# Patient Record
Sex: Female | Born: 1935 | Race: White | Hispanic: No | Smoking: Never smoker
Health system: Southern US, Community
[De-identification: ages and names within clinical notes are randomized; demographics above are authoritative.]

## PROBLEM LIST (undated history)

## (undated) DIAGNOSIS — I10 Essential (primary) hypertension: Secondary | ICD-10-CM

## (undated) DIAGNOSIS — Z8701 Personal history of pneumonia (recurrent): Secondary | ICD-10-CM

## (undated) DIAGNOSIS — M5136 Other intervertebral disc degeneration, lumbar region: Secondary | ICD-10-CM

## (undated) DIAGNOSIS — E785 Hyperlipidemia, unspecified: Secondary | ICD-10-CM

## (undated) DIAGNOSIS — K219 Gastro-esophageal reflux disease without esophagitis: Secondary | ICD-10-CM

## (undated) DIAGNOSIS — A0472 Enterocolitis due to Clostridium difficile, not specified as recurrent: Secondary | ICD-10-CM

## (undated) DIAGNOSIS — IMO0001 Reserved for inherently not codable concepts without codable children: Secondary | ICD-10-CM

## (undated) DIAGNOSIS — K449 Diaphragmatic hernia without obstruction or gangrene: Secondary | ICD-10-CM

## (undated) DIAGNOSIS — M51369 Other intervertebral disc degeneration, lumbar region without mention of lumbar back pain or lower extremity pain: Secondary | ICD-10-CM

## (undated) HISTORY — DX: Diaphragmatic hernia without obstruction or gangrene: K44.9

## (undated) HISTORY — PX: CHOLECYSTECTOMY: SHX55

## (undated) HISTORY — PX: FRACTURE SURGERY: SHX138

## (undated) HISTORY — PX: ABDOMINAL SURGERY: SHX537

## (undated) HISTORY — PX: ABDOMINAL HYSTERECTOMY: SHX81

## (undated) HISTORY — PX: JOINT REPLACEMENT: SHX530

## (undated) HISTORY — DX: Reserved for inherently not codable concepts without codable children: IMO0001

## (undated) HISTORY — DX: Gastro-esophageal reflux disease without esophagitis: K21.9

## (undated) HISTORY — PX: OTHER SURGICAL HISTORY: SHX169

## (undated) HISTORY — PX: APPENDECTOMY: SHX54

---

## 2001-04-09 ENCOUNTER — Emergency Department (HOSPITAL_COMMUNITY): Admission: EM | Admit: 2001-04-09 | Discharge: 2001-04-09 | Payer: Self-pay | Admitting: *Deleted

## 2001-04-12 ENCOUNTER — Encounter: Payer: Self-pay | Admitting: Family Medicine

## 2001-04-12 ENCOUNTER — Inpatient Hospital Stay (HOSPITAL_COMMUNITY): Admission: AD | Admit: 2001-04-12 | Discharge: 2001-05-02 | Payer: Self-pay | Admitting: Family Medicine

## 2001-04-13 ENCOUNTER — Encounter: Payer: Self-pay | Admitting: General Surgery

## 2001-04-16 ENCOUNTER — Encounter: Payer: Self-pay | Admitting: General Surgery

## 2001-04-23 ENCOUNTER — Encounter: Payer: Self-pay | Admitting: Family Medicine

## 2001-04-25 ENCOUNTER — Encounter: Payer: Self-pay | Admitting: General Surgery

## 2001-04-26 ENCOUNTER — Encounter: Payer: Self-pay | Admitting: General Surgery

## 2001-07-10 ENCOUNTER — Inpatient Hospital Stay (HOSPITAL_COMMUNITY): Admission: AD | Admit: 2001-07-10 | Discharge: 2001-07-30 | Payer: Self-pay | Admitting: General Surgery

## 2001-07-11 ENCOUNTER — Encounter: Payer: Self-pay | Admitting: General Surgery

## 2001-07-21 ENCOUNTER — Encounter: Payer: Self-pay | Admitting: General Surgery

## 2001-07-27 ENCOUNTER — Encounter: Payer: Self-pay | Admitting: Family Medicine

## 2003-05-14 ENCOUNTER — Encounter: Payer: Self-pay | Admitting: Family Medicine

## 2003-05-14 ENCOUNTER — Encounter (HOSPITAL_COMMUNITY): Admission: RE | Admit: 2003-05-14 | Discharge: 2003-06-13 | Payer: Self-pay | Admitting: Family Medicine

## 2004-04-09 ENCOUNTER — Ambulatory Visit (HOSPITAL_COMMUNITY): Admission: RE | Admit: 2004-04-09 | Discharge: 2004-04-09 | Payer: Self-pay | Admitting: Family Medicine

## 2006-02-01 ENCOUNTER — Ambulatory Visit (HOSPITAL_COMMUNITY): Admission: RE | Admit: 2006-02-01 | Discharge: 2006-02-01 | Payer: Self-pay | Admitting: Family Medicine

## 2006-10-04 ENCOUNTER — Ambulatory Visit (HOSPITAL_COMMUNITY): Admission: RE | Admit: 2006-10-04 | Discharge: 2006-10-04 | Payer: Self-pay | Admitting: Family Medicine

## 2007-03-25 ENCOUNTER — Inpatient Hospital Stay (HOSPITAL_COMMUNITY): Admission: EM | Admit: 2007-03-25 | Discharge: 2007-03-29 | Payer: Self-pay | Admitting: Emergency Medicine

## 2007-04-24 ENCOUNTER — Inpatient Hospital Stay (HOSPITAL_COMMUNITY): Admission: AD | Admit: 2007-04-24 | Discharge: 2007-04-27 | Payer: Self-pay | Admitting: Family Medicine

## 2007-11-15 HISTORY — PX: COLONOSCOPY: SHX174

## 2007-11-15 HISTORY — PX: ESOPHAGOGASTRODUODENOSCOPY: SHX1529

## 2007-11-15 HISTORY — PX: COLON SURGERY: SHX602

## 2008-04-02 ENCOUNTER — Ambulatory Visit: Payer: Self-pay | Admitting: Internal Medicine

## 2008-04-11 ENCOUNTER — Ambulatory Visit (HOSPITAL_COMMUNITY): Admission: RE | Admit: 2008-04-11 | Discharge: 2008-04-11 | Payer: Self-pay | Admitting: Internal Medicine

## 2008-05-01 ENCOUNTER — Encounter: Payer: Self-pay | Admitting: Internal Medicine

## 2008-05-01 ENCOUNTER — Ambulatory Visit: Payer: Self-pay | Admitting: Internal Medicine

## 2008-05-01 ENCOUNTER — Ambulatory Visit (HOSPITAL_COMMUNITY): Admission: RE | Admit: 2008-05-01 | Discharge: 2008-05-01 | Payer: Self-pay | Admitting: Internal Medicine

## 2008-06-16 ENCOUNTER — Ambulatory Visit (HOSPITAL_COMMUNITY): Admission: RE | Admit: 2008-06-16 | Discharge: 2008-06-16 | Payer: Self-pay | Admitting: Family Medicine

## 2008-06-20 ENCOUNTER — Ambulatory Visit: Payer: Self-pay | Admitting: Internal Medicine

## 2008-08-12 ENCOUNTER — Ambulatory Visit: Payer: Self-pay | Admitting: Gastroenterology

## 2008-09-26 ENCOUNTER — Encounter: Payer: Self-pay | Admitting: Gastroenterology

## 2008-09-26 LAB — CONVERTED CEMR LAB
BUN: 32 mg/dL — ABNORMAL HIGH (ref 6–23)
Basophils Absolute: 0 10*3/uL (ref 0.0–0.1)
Basophils Relative: 0 % (ref 0–1)
CO2: 26 meq/L (ref 19–32)
Calcium: 9.6 mg/dL (ref 8.4–10.5)
Chloride: 102 meq/L (ref 96–112)
Creatinine, Ser: 1.55 mg/dL — ABNORMAL HIGH (ref 0.40–1.20)
HCT: 37.9 % (ref 36.0–46.0)
Hemoglobin: 12.3 g/dL (ref 12.0–15.0)
Lymphs Abs: 1.9 10*3/uL (ref 0.7–4.0)
MCHC: 32.5 g/dL (ref 30.0–36.0)
MCV: 90.9 fL (ref 78.0–100.0)
Monocytes Absolute: 0.5 10*3/uL (ref 0.1–1.0)
Monocytes Relative: 7 % (ref 3–12)
RBC: 4.17 M/uL (ref 3.87–5.11)
RDW: 13.1 % (ref 11.5–15.5)
Sodium: 141 meq/L (ref 135–145)
WBC: 7.5 10*3/uL (ref 4.0–10.5)

## 2008-09-30 ENCOUNTER — Ambulatory Visit: Payer: Self-pay | Admitting: Internal Medicine

## 2009-02-03 DIAGNOSIS — I10 Essential (primary) hypertension: Secondary | ICD-10-CM | POA: Insufficient documentation

## 2009-02-03 DIAGNOSIS — R1310 Dysphagia, unspecified: Secondary | ICD-10-CM | POA: Insufficient documentation

## 2009-02-03 DIAGNOSIS — A0472 Enterocolitis due to Clostridium difficile, not specified as recurrent: Secondary | ICD-10-CM | POA: Insufficient documentation

## 2009-02-03 DIAGNOSIS — Z8719 Personal history of other diseases of the digestive system: Secondary | ICD-10-CM

## 2009-02-03 DIAGNOSIS — K219 Gastro-esophageal reflux disease without esophagitis: Secondary | ICD-10-CM

## 2009-02-03 DIAGNOSIS — K222 Esophageal obstruction: Secondary | ICD-10-CM

## 2009-02-03 DIAGNOSIS — E785 Hyperlipidemia, unspecified: Secondary | ICD-10-CM

## 2009-02-03 DIAGNOSIS — K5289 Other specified noninfective gastroenteritis and colitis: Secondary | ICD-10-CM | POA: Insufficient documentation

## 2009-02-03 DIAGNOSIS — R197 Diarrhea, unspecified: Secondary | ICD-10-CM

## 2009-10-05 ENCOUNTER — Encounter: Payer: Self-pay | Admitting: Gastroenterology

## 2009-11-10 ENCOUNTER — Encounter: Payer: Self-pay | Admitting: Gastroenterology

## 2010-02-11 ENCOUNTER — Ambulatory Visit: Payer: Self-pay | Admitting: Cardiology

## 2010-02-11 ENCOUNTER — Inpatient Hospital Stay (HOSPITAL_COMMUNITY): Admission: EM | Admit: 2010-02-11 | Discharge: 2010-02-15 | Payer: Self-pay | Admitting: Emergency Medicine

## 2010-02-12 ENCOUNTER — Encounter (INDEPENDENT_AMBULATORY_CARE_PROVIDER_SITE_OTHER): Payer: Self-pay | Admitting: Internal Medicine

## 2010-02-15 ENCOUNTER — Inpatient Hospital Stay: Admission: AD | Admit: 2010-02-15 | Discharge: 2010-05-14 | Payer: Self-pay | Admitting: Internal Medicine

## 2010-02-25 ENCOUNTER — Ambulatory Visit (HOSPITAL_COMMUNITY): Admission: RE | Admit: 2010-02-25 | Discharge: 2010-02-25 | Payer: Self-pay | Admitting: Orthopaedic Surgery

## 2010-03-25 ENCOUNTER — Ambulatory Visit (HOSPITAL_COMMUNITY): Admission: RE | Admit: 2010-03-25 | Discharge: 2010-03-25 | Payer: Self-pay | Admitting: Internal Medicine

## 2010-03-30 ENCOUNTER — Ambulatory Visit (HOSPITAL_COMMUNITY): Admission: RE | Admit: 2010-03-30 | Discharge: 2010-03-30 | Payer: Self-pay | Admitting: Orthopaedic Surgery

## 2010-04-05 ENCOUNTER — Ambulatory Visit (HOSPITAL_COMMUNITY): Admission: RE | Admit: 2010-04-05 | Discharge: 2010-04-05 | Payer: Self-pay | Admitting: Internal Medicine

## 2010-04-13 ENCOUNTER — Ambulatory Visit (HOSPITAL_COMMUNITY): Admission: RE | Admit: 2010-04-13 | Discharge: 2010-04-13 | Payer: Self-pay | Admitting: Orthopaedic Surgery

## 2010-10-18 ENCOUNTER — Telehealth (INDEPENDENT_AMBULATORY_CARE_PROVIDER_SITE_OTHER): Payer: Self-pay

## 2010-12-16 NOTE — Progress Notes (Signed)
Summary: ? about entocort  Phone Note Call from Patient Call back at [phone]   Caller: Son-Terry Summary of Call: pts son called. Pts pharmacy switched her to a generic entocort bid . son stated that she has had diarrhea ever since. She is not taking any laxatives right now either. pt wants to know if its ok to try giving them to her three times a day like she was on before. please advise Initial call taken by: Hendricks Limes LPN,  October 18, 2010 12:23 PM     Appended Document: ? about entocort we haven't seen in over 2 years; we need more info regarding diarrhea, etc. Is she in the NH?  Appended Document: ? about entocort pt is at the son's home now. He stated her diarrhea is not bad yet, normally occurs first thing in the morning. No fever.   Appended Document: ? about entocort take one Imodium each morning first thing and see how she does w this regimen alone for a couple of weeks  Appended Document: ? about entocort tried to call son- LMOM  Appended Document: ? about entocort pts son aware

## 2011-02-02 LAB — BASIC METABOLIC PANEL
BUN: 15 mg/dL (ref 6–23)
CO2: 28 mEq/L (ref 19–32)
CO2: 29 mEq/L (ref 19–32)
Calcium: 8.5 mg/dL (ref 8.4–10.5)
Calcium: 8.7 mg/dL (ref 8.4–10.5)
Chloride: 102 mEq/L (ref 96–112)
Creatinine, Ser: 0.97 mg/dL (ref 0.4–1.2)
Creatinine, Ser: 1.33 mg/dL — ABNORMAL HIGH (ref 0.4–1.2)
GFR calc Af Amer: 29 mL/min — ABNORMAL LOW (ref >60)
GFR calc Af Amer: 45 mL/min — ABNORMAL LOW (ref >60)
GFR calc Af Amer: >60 mL/min (ref >60)
GFR calc non Af Amer: 24 mL/min — ABNORMAL LOW (ref >60)
GFR calc non Af Amer: 37 mL/min — ABNORMAL LOW (ref >60)
GFR calc non Af Amer: 45 mL/min — ABNORMAL LOW (ref >60)
Glucose, Bld: 139 mg/dL — ABNORMAL HIGH (ref 70–99)
Glucose, Bld: 144 mg/dL — ABNORMAL HIGH (ref 70–99)
Potassium: 4.5 mEq/L (ref 3.5–5.1)
Sodium: 134 mEq/L — ABNORMAL LOW (ref 135–145)
Sodium: 140 mEq/L (ref 135–145)

## 2011-02-02 LAB — CBC
HCT: 28.2 % — ABNORMAL LOW (ref 36.0–46.0)
Hemoglobin: 10.5 g/dL — ABNORMAL LOW (ref 12.0–15.0)
Hemoglobin: 9.7 g/dL — ABNORMAL LOW (ref 12.0–15.0)
MCHC: 34.3 g/dL (ref 30.0–36.0)
MCHC: 34.4 g/dL (ref 30.0–36.0)
MCHC: 34.9 g/dL (ref 30.0–36.0)
MCV: 89.2 fL (ref 78.0–100.0)
Platelets: 165 10*3/uL (ref 150–400)
RBC: 3.04 MIL/uL — ABNORMAL LOW (ref 3.87–5.11)
RBC: 3.11 MIL/uL — ABNORMAL LOW (ref 3.87–5.11)
RBC: 3.14 MIL/uL — ABNORMAL LOW (ref 3.87–5.11)
RBC: 3.4 MIL/uL — ABNORMAL LOW (ref 3.87–5.11)
RDW: 13.2 % (ref 11.5–15.5)
WBC: 9 10*3/uL (ref 4.0–10.5)

## 2011-02-02 LAB — DIFFERENTIAL
Basophils Absolute: 0 10*3/uL (ref 0.0–0.1)
Basophils Absolute: 0 10*3/uL (ref 0.0–0.1)
Basophils Relative: 0 % (ref 0–1)
Basophils Relative: 0 % (ref 0–1)
Basophils Relative: 0 % (ref 0–1)
Eosinophils Absolute: 0 10*3/uL (ref 0.0–0.7)
Eosinophils Absolute: 0.1 10*3/uL (ref 0.0–0.7)
Eosinophils Relative: 1 % (ref 0–5)
Lymphocytes Relative: 11 % — ABNORMAL LOW (ref 12–46)
Lymphocytes Relative: 11 % — ABNORMAL LOW (ref 12–46)
Lymphs Abs: 1 10*3/uL (ref 0.7–4.0)
Monocytes Absolute: 0.5 10*3/uL (ref 0.1–1.0)
Monocytes Absolute: 0.8 10*3/uL (ref 0.1–1.0)
Monocytes Relative: 10 % (ref 3–12)
Monocytes Relative: 7 % (ref 3–12)
Monocytes Relative: 9 % (ref 3–12)
Neutro Abs: 7 10*3/uL (ref 1.7–7.7)
Neutro Abs: 7.6 10*3/uL (ref 1.7–7.7)
Neutrophils Relative %: 78 % — ABNORMAL HIGH (ref 43–77)
Neutrophils Relative %: 79 % — ABNORMAL HIGH (ref 43–77)
Neutrophils Relative %: 81 % — ABNORMAL HIGH (ref 43–77)

## 2011-02-02 LAB — CULTURE, BLOOD (ROUTINE X 2): Culture: NO GROWTH

## 2011-02-02 LAB — CARDIAC PANEL(CRET KIN+CKTOT+MB+TROPI)
Relative Index: INVALID (ref 0.0–2.5)
Total CK: 43 U/L (ref 7–177)
Total CK: 88 U/L (ref 7–177)
Troponin I: 0.02 ng/mL (ref 0.00–0.06)
Troponin I: 0.02 ng/mL (ref 0.00–0.06)

## 2011-02-04 LAB — URINALYSIS, ROUTINE W REFLEX MICROSCOPIC
Glucose, UA: NEGATIVE mg/dL
Ketones, ur: NEGATIVE mg/dL
Nitrite: POSITIVE — AB
Protein, ur: NEGATIVE mg/dL

## 2011-02-04 LAB — COMPREHENSIVE METABOLIC PANEL
ALT: 12 U/L (ref 0–35)
AST: 19 U/L (ref 0–37)
Albumin: 3.2 g/dL — ABNORMAL LOW (ref 3.5–5.2)
Calcium: 8.7 mg/dL (ref 8.4–10.5)
GFR calc Af Amer: 59 mL/min — ABNORMAL LOW (ref >60)
Sodium: 138 mEq/L (ref 135–145)
Total Protein: 6 g/dL (ref 6.0–8.3)

## 2011-02-04 LAB — URINE CULTURE: Colony Count: >=100000

## 2011-02-04 LAB — DIFFERENTIAL
Eosinophils Absolute: 0 10*3/uL (ref 0.0–0.7)
Eosinophils Relative: 1 % (ref 0–5)
Lymphs Abs: 1.2 10*3/uL (ref 0.7–4.0)
Monocytes Relative: 6 % (ref 3–12)

## 2011-02-04 LAB — CK TOTAL AND CKMB (NOT AT ARMC)
Relative Index: INVALID (ref 0.0–2.5)
Total CK: 34 U/L (ref 7–177)

## 2011-02-04 LAB — TROPONIN I: Troponin I: 0.02 ng/mL (ref 0.00–0.06)

## 2011-02-04 LAB — CBC
MCHC: 34.4 g/dL (ref 30.0–36.0)
Platelets: 145 10*3/uL — ABNORMAL LOW (ref 150–400)
RBC: 3.62 MIL/uL — ABNORMAL LOW (ref 3.87–5.11)
RDW: 13.5 % (ref 11.5–15.5)

## 2011-02-04 LAB — PROTIME-INR
INR: 1.2 (ref 0.00–1.49)
Prothrombin Time: 15.1 seconds (ref 11.6–15.2)

## 2011-02-04 LAB — URINE MICROSCOPIC-ADD ON

## 2011-02-04 LAB — FERRITIN: Ferritin: 74 ng/mL (ref 10–291)

## 2011-02-04 LAB — RETICULOCYTES
Retic Count, Absolute: 50.4 10*3/uL (ref 19.0–186.0)
Retic Ct Pct: 1.4 % (ref 0.4–3.1)

## 2011-02-04 LAB — IRON AND TIBC: Saturation Ratios: 8 % — ABNORMAL LOW (ref 20–55)

## 2011-02-04 LAB — APTT: aPTT: 42 seconds — ABNORMAL HIGH (ref 24–37)

## 2011-02-04 LAB — FOLATE: Folate: >20 ng/mL

## 2011-03-29 NOTE — Assessment & Plan Note (Signed)
NAME:  Elizabeth Oneal, Elizabeth Oneal                   CHART#:  [id number]   DATE:  06/20/2008                       DOB:  [birth year]   Last seen 05/01/2008, at which time, she underwent both EGD with [facility]  dilation and ileocolonoscopy with  stool sampling and biopsies.  Stool  studies were negative for infection.  However, biopsies were consistent  with microscopic colitis.  She was started on Entocort 6 mg orally  daily, and this was associated with resolution of her diarrhea symptoms.  However, she could not afford the Entocort, and we provided her samples.  She ran out, she had a rapid recurrence of diarrhea and apparently had  some mental status changes last week for which she saw Dr. Gerda Diss and  was in the process, been worked up.  We got her back on some more  samples of Entocort, diarrhea has now resolved.  Once again, she was  found to have a Schatzki's ring, and this was dilated.  She is doing  well from a standpoint of dysphagia.  At this point in time, her oral  intake is reported to be good at this point.   CURRENT MEDICATIONS:  See updated list.   ALLERGIES:  Codeine.   PHYSICAL EXAMINATION:  Today, she appears comfortable, in no acute  distress.  Weight 149, height 5 feet 10 inches, temperature 99.2, BP  120/60, pulse 80.  Detailed exam is deferred.   ASSESSMENT:  1. Dysphagia resolved, status post Maloney dilation of Schatzki's      ring.  2. Microscopic diarrhea secondary microscopic colitis.  Nice response      to low-dose Entocort (budesonide).   RECOMMENDATIONS:  We will continue her on 6 mg daily dose for the next  month, then we will drop back to 1-3 mg once daily for the subsequent  month.  We will see her back in 2 months to see how she is doing and  decide about further low-dose therapy versus switching her to a  mesalamine versus tapering Entocort off completely and then seeing how  she does.       Jonathon Bellows, M.D.  Electronically Signed    RMR/MEDQ  D:   06/20/2008  T:  06/21/2008  Job:  [id number]   cc:   Dr. Gerda Diss

## 2011-03-29 NOTE — H&P (Signed)
NAME:  Elizabeth Oneal, Elizabeth Oneal                  ACCOUNT NO.:  [id number]   MEDICAL RECORD NO.:  [id number]          PATIENT TYPE:  AMB   LOCATION:  DAY                           FACILITY:  [facility]   PHYSICIAN:  R. Roetta Sessions, M.D. DATE OF BIRTH:  [birth year]   DATE OF ADMISSION:  DATE OF DISCHARGE:  LH                              HISTORY & PHYSICAL   REFERRING PHYSICIAN:  Donna Bernard, MD.   REASON FOR CONSULTATION:  Diarrhea.   HISTORY OF PRESENT ILLNESS:  The patient is a very pleasant 77 year old  Caucasian female, sent over courtesy by Dr. Simone Curia, to further  evaluate a good 7 to 68-month history of nonbloody watery diarrhea.  The  patient states she was in her usual state of GI health until October and  November, when she unfortunately suffered a cat bite, which was  complicated by cellulitis, which lead to antibiotics therapy.  Just  during and after antibiotics therapy, she developed nonbloody watery  diarrhea.  Reportedly, she had C. difficile toxin assays on her stool,  which came back negative.  She has had multiple rounds of metronidazole  and by report, she seemingly did improve somewhat each time she had a  course of Flagyl, but diarrhea never really went away.  She has been on  cholestyramine 4 g daily which slows the diarrhea, but if she gets off  cholestyramine, she has recurrent diarrhea upwards of 6-7 watery bowel  movements daily in contrast to her premorbid bowel frequency of 1-2  formed bowel movements daily.  She is never passing blood per rectum,  has not had any melena.  She had a colonoscopy by Dr. Myra Gianotti some 8  years ago.  Reportedly has diverticulosis and in fact had a history of  complicated diverticulitis, by report.  Had emergency laparotomy with  diversion, colostomy, and subsequent takedown years ago by Dr. Katrinka Blazing.   No one else around her has any problems with diarrhea.  There is no  family history of inflammatory bowel disease.   MEDICATIONS:   Have been reviewed (see below).   She does not have nocturnal diarrhea.  Her grandson, Ailsa Mireles, who  accompanies her today states she has lost some weight recently, but is  unable to quantify the amount.  In addition to diarrhea, the patient  tells me she has had a recent symptoms of esophageal dysphagia to solids  and feels that meat and bread get hung behind her breastbone.  She  really does not have much in the way reflux symptoms, perhaps once  monthly for which she takes Pepcid [facility] p.r.n.   She tells me that on some imaging study in the past, she was told she  had gallstones, but they are apparently not causing a problem at this  time.  She really has not had any significant abdominal pain.   PAST MEDICAL HISTORY:  Significant for hypertension, complicated  diverticulitis, gastroesophageal reflux disease, hyperlipidemia, and  diarrhea as outlined above.   PAST SURGERIES:  Include; colonoscopy, laparotomy for diverticular  abscess with colostomy and subsequent takedown, and colonoscopy  as  outlined above.   CURRENT MEDICATIONS:  1. Triamterene and hydrochlorothiazide 37.5 mg daily.  2. Lovastatin 20 mg daily.  3. Citalopram 30 mg daily.  4. Primidone 50 mg daily.  5. Multivitamin daily.  6. Cholestyramine 4 g daily.  7. Alprazolam 0.25 mg daily.  8. Ibuprofen 200 mg p.r.n.  9. Pepcid AC p.r.n.   ALLERGIES:  CODEINE.   FAMILY HISTORY:  Mother had nerve problems.  Father died at age 73,  old age.  No history chronic GI or liver illness.   SOCIAL HISTORY:  The patient is widowed, has two children.  She is  retired and lives in [place].  No tobacco or no alcohol.  No  illicit drugs.   REVIEW OF SYSTEMS:  Has not had any chest pain or dyspnea on exertion.  No fevers, chills, night sweats, some ill-defined weight loss as  outlined above.  Otherwise, as in the history of present illness.   PHYSICAL EXAMINATION:  GENERAL:  Reveals a remarkably alert, well   oriented, and conversant 77 year old lady, accompanied by her grandson,  Terrianne Cavness, who appears in no acute distress.  VITAL SIGNS:  Weight 157, height 5 feet 1 inch, temperature 98.4, BP  120/70, and pulse 88.  SKIN:  Warm and dry.  There is no jaundice.  HEENT:  No scleral icterus.  Conjunctivae pink.  Oral cavity, no  lesions.  CHEST:  Lungs are clear to auscultation.  CARDIOVASCULAR:  Regular rate and rhythm without murmur, gallop, or rub.  BREAST:  Deferred.  ABDOMEN:  Well healed laparotomy scars.  Positive bowel sounds and no  bruits.  Soft and nontender without appreciable mass or  hepatosplenomegaly.  EXTREMITIES:  No edema.  RECTAL:  She has some degree of anal stenosis.  No mass.  Rectal vault:  Scant brown stool, is Hemoccult negative.   IMPRESSION:  The patient is a 77 year old lady with diarrhea dating back  to October 2008.  There is a strong __________ relationship with a cat  bite resulting in secondary cellulitis requiring antibiotics.  She has  had an inadequate response with empiric treatment with metronidazole on  at least two occasions.  It is striking that she continues to have a  problem with watery nonbloody diarrhea since this time in contrast to  her premorbid gastrointestinal status of 1-2 formed bowel movements  daily.  Even though her Clostridium difficile is negative, she certainly  could have a persisting antibiotic-associated diarrhea or simply have  had false negative toxin assays.  She has been taking Questran,  splinting her dose b.i.d. and has been taking it with other medicines  concomitantly and possibly even metronidazole previously.  It is also  possible that the cellulitis/antibiotic episode could be entirely  coincidental to the onset of her diarrhea.  We would need to also be  concerned about other diagnostic possibilities such as collagenous  lymphocytic colitis.  As a separate issue, she reports to me esophageal  dysphagia to solids.    RECOMMENDATIONS:  I feel the best course of action is to go ahead and  set her up for a diagnostic colonoscopy in the near future at [facility] with plans to obtain fresh stool specimens for the microbiology  lab and go ahead and biopsy  to rule out microscopic colitis as a  potential cause for her diarrhea.  Prior to scheduling colonoscopy, we  will go ahead and have her undergo a barium plus esophagram to see if  she has a significant lesion in her upper GI tract contributing to  dysphagia.  If she has evidence of a ring and/or stricture, a minimal to  esophageal dilation also I told Mr. Wakeland and his grandmother that we  would plan to do an EGD and dilate her esophagus as appropriate at the  same time as the colonoscopy.  The risks, benefits, limitations,  alternatives have been reviewed.  All questions have been answered.  All  parties agreeable.  We will make further recommendations in the very  near future.   I would thank Dr. Tommie Ard for allowing me to see this his very nice  lady today      R. Roetta Sessions, M.D.  Electronically Signed     RMR/MEDQ  D:  04/02/2008  T:  04/03/2008  Job:  [id number]

## 2011-03-29 NOTE — Assessment & Plan Note (Signed)
NAMEMarland Kitchen  SOMALIA, SEGLER                   CHART#:  [id number]   DATE:  08/12/2008                       DOB:  05-21-1916   CHIEF COMPLAINT:  Follow up of diarrhea.   SUBJECTIVE:  The patient is a 77 year old lady who presents today in  followup.  She was last seen on 06/20/2008.  She has a history of  microscopic colitis.  She has been on low-dose Entocort 6 mg daily,  doing very well.  When we tried to taper her down to 3 mg daily, she had  recurrence of her diarrhea, however.  We increased Entocort back to 2  daily (6 mg).  Her diarrhea has now resolved again.  Overall, she is  doing well.  She is having about 1 to 2 soft stools daily.  She denies  any abdominal pain.  No nausea or vomiting.  Appetite is good.  She  denies any weight loss.  No blood in stool or melena.   CURRENT MEDICATIONS:  See updated list.   ALLERGIES:  Codeine.   PHYSICAL EXAMINATION:  VITAL SIGNS:  Weight 154, temperature 98.3, blood  pressure 130/66, and pulse 80.GENERAL:  Pleasant elderly Caucasian  female, in no acute distress.SKIN:  Warm and dry.  No jaundice.HEENT:  Sclerae nonicteric.  Oropharyngeal mucosa moist and pink. ABDOMEN:  Positive bowel sounds.  Abdomen is soft, nontender, and nondistended.   IMPRESSION:  Microscopic colitis, difficult to taper off of Entocort.  She has always done very well at 6 mg daily, but did not tolerate 3 mg  daily due to recurrence of diarrhea.  She is also having difficulty  affording the medication and we have been providing her samples.  It  appears we may need to consider chronic mesalamine therapy.  We will try  to determine if any of the  mesalamine preps are covered by her drug plan.  For now, we will keep  her on Entocort 6 mg daily, #42 samples provided.  Further  recommendations to follow.       Tana Coast, P.A.  Electronically Signed     Kassie Mends, M.D.  Electronically Signed    LL/MEDQ  D:  08/12/2008  T:  08/12/2008  Job:  [id number]   cc:    Dr. Gerda Diss

## 2011-03-29 NOTE — H&P (Signed)
NAMEJOCILYN, Elizabeth Oneal                  ACCOUNT NO.:  [id number]   MEDICAL RECORD NO.:  [id number]          PATIENT TYPE:  INP   LOCATION:  A314                          FACILITY:  [facility]   PHYSICIAN:  Donna Bernard, M.D.DATE OF BIRTH:  [birth year]   DATE OF ADMISSION:  04/24/2007  DATE OF DISCHARGE:  LH                              HISTORY & PHYSICAL   CHIEF COMPLAINT:  Vomiting all night, severe diarrhea and abdominal  pain, dizzy when standing.   SUBJECTIVE:  This patient is an 77 year old female recently in the  hospital for cellulitis secondary to cat bite.  The patient was given an  appropriate course of Augmentin. She finished this and within a few days  started developing diarrhea.  This was about a week ago. Since then she  has had diarrhea off and on the last couple days, has had severe  diarrhea with 10-12 trips per day.  She is feeling dizzy and  lightheaded, and that when she stands up she feels extremely weak.  It  should be noted the patient has a baseline fatigue in recent years, but  it is much worse.  The patient also reports she has had intermittent  vomiting.  However, in the last 24 hours she has been unable to keep  anything down and has vomited at least 6 times. She also notes abdominal  pain, crampy in nature, coming and going, pan abdominal. No urinary  symptoms.  The patient feels like she may have had some fever at times,  but she is not sure; she did not check it.  She claims compliance with  her current medicines which include:  1. Dyazide 37.5/25 one q.a.m.  2. Xanax 0.25 b.i.d.  3. Nexium 40 mg daily.  4. Ultracet one b.i.d. p.r.n. for chronic pain.  5. Celexa 20 mg daily.  6. Lovastatin 20 mg nightly.   PAST SURGERIES:  1. Remote hysterectomy.  2. Vein stripping.  3. Cataract operation both eyes.  4. Knee replacement.   PRIOR MEDICAL HISTORY:  Significant for:  1. Hyperlipidemia.  2. Hypertension.  3. Chronic venous stasis.  4. Chronic  depression and anxiety accompanied by fatigue.  5. Reflux.   FAMILY HISTORY:  Positive for coronary artery disease.   ALLERGIES:  Sensitivities to multiple medications and true allergy to  CODEINE and PENICILLIN.   REVIEW OF SYSTEMS:  Otherwise negative.   SOCIAL HISTORY:  The  patient is widowed, has several children.  No  tobacco or alcohol use.   PHYSICAL EXAMINATION:  VITAL SIGNS:  Temperature 99, blood pressure  135/80 when lying down, 105/60 when sitting up, pulse rate 95 lying  down, 120 sitting up.  HEENT: Mucous membranes dry.  GENERAL:  Fretful, in some distress, holding her abdomen.  NECK:  Supple.  LUNGS:  Clear.  HEART:  Regular rate and rhythm.  ABDOMEN:  Bowel sounds are present.  Diffuse tenderness.  RECTAL: Exam deferred.  EXTREMITIES: No significant edema.  SKIN:  Normal.  NEUROLOGIC:  Exam intact.   SIGNIFICANT LABORATORY DATA:  White blood count normal.  MET-7:  Creatinine 1.2, BUN in the 20s. Stool studies pending.   IMPRESSION:  1. Probable pseudomembranous colitis.  2. Orthostatic hypotension with dehydration.   PLAN:  1. IV rehydration.  2. Oral metronidazole.  3. Oral Questran.  4. Will see how her abdominal exams is looking tomorrow in terms of      deciding whether to do the CT scanner not.  5. Further orders as noted on the chart.  6. Zofran p.r.n. for nausea.      Donna Bernard, M.D.  Electronically Signed     WSL/MEDQ  D:  04/25/2007  T:  04/25/2007  Job:  [id number]

## 2011-03-29 NOTE — Op Note (Signed)
NAME:  Elizabeth Oneal, Elizabeth Oneal                  ACCOUNT NO.:  [id number]   MEDICAL RECORD NO.:  [id number]          PATIENT TYPE:  AMB   LOCATION:  DAY                           FACILITY:  [facility]   PHYSICIAN:  R. Roetta Sessions, M.D. DATE OF BIRTH:  06/14/1916   DATE OF PROCEDURE:  05/01/2008  DATE OF DISCHARGE:                               OPERATIVE REPORT   EGD with Elease Hashimoto dilation followed by ileocolonoscopy with biopsy stool  sampling.   INDICATIONS FOR PROCEDURE:  This is a 77 year old lady with refractory  nonbloody diarrhea.  She also has esophageal dysphagia to solids.  She  underwent a barium pill esophagram which demonstrated obstruction to  passage and filled the EG junction.  The pill ultimately dissolved in  the distal esophagus without passing.  EGD with esophageal dilation as  appropriate followed by colonoscopy now being done to further evaluate  her symptoms.  Risks, benefits, alternatives, and limitations have been  reviewed, questions answered, and all parties agreeable.  Please see  documentation and medical record.   PROCEDURE NOTE:  O2 saturation, blood pressure, pulse, and respirations  were monitored through the entire procedure.   CONSCIOUS SEDATION:  Versed 5 mg IV, Demerol 75 mg IV in divided doses.   INSTRUMENT:  Pentax video chip system.  Cetacaine spray for topical  pharyngeal anesthesia.   FINDINGS:  EGD:  Examination of the tubular esophagus revealed a  Schatzki ring.  Esophageal mucosa otherwise appeared normal.  EG  junction easily traversed.   Stomach:  Gastric cavity was emptied and insufflated well with air.  Thorough examination of the gastric mucosa including retroflex view of  the proximal stomach and esophagogastric junction demonstrated only a  small hiatal hernia.  Pylorus was patent and easily traversed.  Examination of bulb and second portion revealed no abnormalities.  Therapeutic/diagnostic maneuvers were performed.  Scope was withdrawn.  A  54-French Maloney dilator was passed to full insertion and look back  revealed no apparent complication with the patient dilated.  The patient  tolerated the procedure well and was prepped for colonoscopy.  Digital  rectal exam revealed no abnormalities.   Endoscopic Findings:  The prep was good.  Examination of the colon was  undertaken.  The surgical anastomosis was identified at 20 cm and  appeared normal.  The residual colon was examined with the scope easily  passing from the anastomosis to the cecum.  Cecum, ileocecal valve, and  appendiceal orifice were well seen and photographed for the record.  Terminal ileum was intubated to 5 cm.  From this level, the scope was  slowly and cautiously withdrawn.  All previously mentioned mucosal  surfaces were again seen.  The residual colonic mucosa appeared normal.  All loose somewhat diffusely pale in appearance stool sample was  suctioned out for the microbiology lab.  Segmental biopsies of the  transverse and distal colon were taken for histologic study.  We went on  and biopsied one of the areas in transverse colon.  It bled more than  expected.  There was steady red color blood noted.  After  the biopsy was  taken, I elected to place a clip.  This was done without difficulty and  reduced immediate hemostasis.  Please see photos.  The remainder of the  residual colonic mucosa appeared normal.  Biopsies as stated of the  transverse and distal colon were taken to rule out microscopic colitis.  Scope was pulled down the rectum where a thorough examination of the  rectal mucosa including retroflex view of the anal verge demonstrated no  abnormalities.  The patient tolerated both procedures well and was  reactive to endoscopy.   IMPRESSION:  1. Esophagogastroduodenoscopy:  Schatzki ring, otherwise normal      esophagus.  Status post passage with [facility] dilator.  Small hiatal      hernia, otherwise normal stomach, D1 and D2.  2. Colonoscopy  findings:  Normal rectum.  Surgical anastomosis 20 cm      slightly pale, but otherwise normal-appearing residual colon and      terminal ileum status post segmental biopsies and stool studies      status post clipping with the biopsies.  Sites for the reasons      outlined above.   RECOMMENDATIONS:  1. Continue Pepcid before meals as needed for occasional reflux.  2. Advance diet as tolerated.  3. Followup on stool studies and biopsies.  4. No MRI until hemostasis clip document to have passed.  Further      recommendations to follow.      Jonathon Bellows, M.D.  Electronically Signed     RMR/MEDQ  D:  05/01/2008  T:  05/01/2008  Job:  [id number]   cc:   Donna Bernard, M.D.  Fax: [phone]

## 2011-03-29 NOTE — H&P (Signed)
Elizabeth Oneal, Elizabeth Oneal                  ACCOUNT NO.:  [id number]   MEDICAL RECORD NO.:  [id number]          PATIENT TYPE:  INP   LOCATION:  A315                          FACILITY:  [facility]   PHYSICIAN:  Scott A. Gerda Diss, MD    DATE OF BIRTH:  1916/04/13   DATE OF ADMISSION:  03/25/2007  DATE OF DISCHARGE:  LH                              HISTORY & PHYSICAL   CHIEF COMPLAINT:  Fever, left leg swelling, redness, tenderness.   HISTORY OF PRESENT ILLNESS:  This 77 year old, white female who states  she has an indoor cat, has always stayed indoors, does not go outdoors.  She states it is up-to-date on rabies shots, no way to confirm this.  Anyway the cat got really mad at another count that was outside the door  of the house and the cat just went ballistic and attacked Elizabeth Oneal who  happened to be standing right at the door trying to shoo away the other  cat. She states otherwise the cat acts normal, is very loving, kind and  does not do these things, only when another cat comes close does it get  agitated. The patient states that this bite happened about a week ago  and has been progressively getting worse since then. More redness,  tenderness and some fever today and not feeling good.   PAST MEDICAL HISTORY:  The patient has a history of hyperlipidemia,  hiatal hernia, hypertension, peripheral venous stasis, reflux and knee  arthritis. She  has had a knee replacement, hysterectomy and vein  stripping.   FAMILY HISTORY:  Heart disease.   ALLERGIES:  CODEINE and PENICILLIN.   Does not smoke or drink.   1. Dyazide 37.5/25 daily.  2. Xanax 0.25 mg p.r.n.  3. Ultracet b.i.d. p.r.n.  4. Celexa 20 mg daily.  5. Mevacor 20 mg daily.   REVIEW OF SYSTEMS:  Negative for headaches, sore throat, cough,  shortness of breath, vomiting, diarrhea.   PHYSICAL EXAMINATION:  GENERAL:  NAD.  VITAL SIGNS:  Temperature is 100.0.  LUNGS:  Clear.  HEART:  Regular.  NECK:  Supple.  Throat is normal.   White count 12,000, left shift noted. Met 7 overall looks good.  Potassium borderline 3.5, creatinine 1.25.  Liver enzymes negative.  Infection of the left lower leg shows redness, swelling, cellulitis of  the left lower leg with a couple of punctate wounds. Culture was taken.   ASSESSMENT/PLAN:  Significant infection of the leg, cellulitis due to a  cat bite. Cover with Unasyn to cover [place] and if does not improve  over next several days consider even covering for MRSA but most likely  this is all related to a cat bite. Because of the severe nature of it  and her age, the patient does need to be in the hospital for this.      Scott A. Gerda Diss, MD  Electronically Signed     SAL/MEDQ  D:  03/26/2007  T:  03/27/2007  Job:  [id number]

## 2011-03-29 NOTE — Assessment & Plan Note (Signed)
NAMEMarland Kitchen  Elizabeth Oneal, Elizabeth Oneal                   CHART#:  [id number]   DATE:  09/30/2008                       DOB:  Aug 20, 1916   FOLLOWUP:  Microscopic colitis since been somewhat recalcitrant.  We  planed to avoid mesalamine preparation given her renal insufficiency.  Repeat C. diff recently came back negative.  We bumped her Entocort up  to 9 mg daily back on September 09, 2008.  She takes 1 Imodium at home in  the order of every other day in addition of 3 Entocort tablets daily.  Bowel function has normalized.  She has 1-2 soft bowel movements daily.  She rarely has had diarrhea at all now.  She has not had any abdominal  pain, although her son states she has complained of fleeting abdominal  pain when she first wakes up in the morning, although the patient  currently denies.  She has lost 16 pounds since 08/12/2008 by our  scales, but her oral intake has been very good.  By her son's  observation, no nausea or vomiting.  She is not taking any further  nonsteroidals at our request.   CURRENT MEDICATIONS:  See updated list.   PHYSICAL EXAMINATION:  GENERAL:  Today, she is examined in the chair.  Mobility is diminished.  She appears in no acute distress, accompanied  by her son.  VITAL SIGNS:  Weight 139, height 5 feet, temperature 98.2, BP 102/64,  and pulse 84.  CHEST:  Lungs are clear to auscultation.  CARDIAC:  Regular rate and rhythm without murmur, gallop, or rub.  ABDOMEN:  Nondistended, positive bowel sounds, soft, entirely nontender  without appreciable mass or organomegaly.   ASSESSMENT:  All in all, her diarrhea is secondary to microscopic  colitis much better now on full-dose Entocort therapy.  I talked the  pros and cons of corticosteroid therapy versus mesalamine therapy versus  what she is taking currently.  Entocort is far away in the patient's  best interest for the time being, although this is a very expensive  medication.   RECOMMENDATIONS:  Continue Entocrot 9 mg daily  for the next 3 months  from having drop down to 6 mg daily for the third month.  We will see  her back in 12 weeks from now.  She may use Imodium p.r.n. if this is  felt to be helpful.   ADDENDUM:  Labs from 09/29/2008, CBC completely normal.  BMET okay  except for BUN 32, creatinine 1.55, which is improved.  We will send  copies of these labs to Dr. Gerda Diss for further  consideration.  We will give her samples of Entocort through the office  as they become available.       Jonathon Bellows, M.D.  Electronically Signed     RMR/MEDQ  D:  09/30/2008  T:  09/30/2008  Job:  [id number]   cc:   Dr. Gerda Diss

## 2011-04-01 NOTE — Discharge Summary (Signed)
[facility]  Patient:    Elizabeth Oneal, Elizabeth Oneal Visit Number: [id number] MRN: [id number]          Service Type: Attending:  Elpidio Anis, M.D. Dictated by:   Elpidio Anis, M.D. Adm. Date:  07/10/01 Disc. Date: 07/30/01   CC:         Loran Senters, M.D.   Discharge Summary  DISCHARGE DIAGNOSES:  1. Diverticulitis status post Hartmann procedure.  2. Extensive intra-abdominal adhesions.  3. Hypertension.  4. Osteoarthritis.  5. Anemia.  6. Hypokalemia.  7. Supraventricular tachycardia.  SPECIAL PROCEDURE:  Colostomy closure with extensive adhesiolysis August 27, central line placement for TPN, September 7.  DISPOSITION:  The patient was discharged home in stable and satisfactory condition.  DISCHARGE MEDICATIONS:  1. Zofran 4 mg q. 8h p.r.n.  2. Reglan 5 mg before meals and at bedtime.  She was advised to supplement her diet with Carnation instant breakfast and Ensure.  SUMMARY:  An 77 year old female with a history of severe diverticulitis status post Hartmann procedure on May 30. She did well in the postoperative period. She was in good physical condition and desired to have her colostomy closed. She was admitted for colostomy closure. She was taken to the operating room on August 27 through the [facility]patient [facility]. Because of her severe ______, she had extensive adhesions. Extensive adhesiolysis had to be done before colostomy closure could be carried out. An end to end anastomosis was carried out using hand sewing. This was felt to be a more stable closure because of the extensive edema of the bowel. As anticipated, the patient had hypoactive bowel sounds in the postoperative period. She also had some hypoventilation due to morphine and sulfate infusion. This was corrected and she was treated with IV Cardizem. The tachycardia that she had responded appropriately. She required transfusion in the postoperative period. Because of slow return of bowel  activity, a central venous catheter was started late in her hospital course. This was started on September 7 and TPN was started. She had gradual improvement over the next few days. She had some gradual return of bowel function. She was treated with Zofran. After the patient was tolerating a diet, her TPN was discontinued. The central line was discontinued finally on the 15th. She remained stable and was subsequently discharged on the 16th in satisfactory condition. Dictated by:   Elpidio Anis, M.D. Attending:  Elpidio Anis, M.D. DD:  09/02/01 TD:  09/04/01 Job: [id number]

## 2011-04-01 NOTE — Procedures (Signed)
   NAME:  Elizabeth Oneal, Elizabeth Oneal                              ACCOUNT NO.:  [id number]   MEDICAL RECORD NO.:  [id number]                  PATIENT TYPE:   LOCATION:                                       FACILITY:   PHYSICIAN:  Donna Bernard, M.D.             DATE OF BIRTH:   DATE OF PROCEDURE:  05/14/2003  DATE OF DISCHARGE:                                    STRESS TEST   INDICATIONS FOR PROCEDURE:  Chest pain with multiple risk factors in an 77-  year-old woman.   PROCEDURE:  Stress test.   DESCRIPTION OF PROCEDURE:  The stress test was performed with an Adenosine  Cardiolite regimen.  The patient due to musculoskeletal problems was unable  to exercise.  Resting EKG showed a partial bundle branch block with typical  ST-T segment changes.  The patient tolerated the injection of the Adenosine  relatively well. She did develop some mild side effects.  Her maximum  achieved heart rate with this was 109 with a submaximal predicted heart rate  of 114.  At this point there were no significant ST-T segment changes noted.   IMPRESSION:  Negative borderline adequate stress test.   PLAN:  Await Cardiolite results for further delineation of risks.                                                Donna Bernard, M.D.    WSL/MEDQ  D:  07/30/2003  T:  07/30/2003  Job:  [id number]

## 2011-04-01 NOTE — Discharge Summary (Signed)
Elizabeth Oneal, Elizabeth Oneal                  ACCOUNT NO.:  [id number]   MEDICAL RECORD NO.:  [id number]           PATIENT TYPE:  INP   LOCATION:  A315                          FACILITY:  [facility]   PHYSICIAN:  Donna Bernard, M.D.DATE OF BIRTH:  [birth year]   DATE OF ADMISSION:  03/25/2007  DATE OF DISCHARGE:  05/15/2008LH                               DISCHARGE SUMMARY   FINAL DIAGNOSES:  1. Cellulitis.  2. Cat bite  3. Reactive airways.   FINAL DISPOSITION:  1. Patient discharged to home.  2. Discharge medications maintain usual medicines, plus:      a.     Ventolin 2 puffs q.4 h as needed for wheezes.      b.     Augmentin 875 b.i.d. for 10 days.   FOLLOWUP:  In the office of Dr. Lubertha South in one week.   INITIAL HISTORY AND PHYSICAL:  Please see H&P as dictated.   HOSPITAL COURSE:  This patient is an 77 year old white female who was  bitten by her cat.  She did developed subsequent redness, tenderness,  and pain on her calf.  This expanded rapidly over the next couple days.  The patient had fever and chills.  She came in with very inflamed  cellulitic leg and a temperature of 100.  Blood cultures were obtained.  Culture of the leg was obtained.  The patient was placed on Unasyn IV  for the likely culprit of Pasteurella multocida.  Over the next couple 3  days, the patient improved slowly.  She did develop significant  wheezing.  This was treated with nebulizer therapy.  Culture came back  positive for Pasteurella multocida, as expected.  The patient  defervesced on the antibiotics.  By the day of discharge, she was felt  stable enough to discharged homeward and she was discharged with  diagnoses and disposition as noted above.  It should be noted that the  patient's blood culture was also positive for Pasteurella multocida in  addition to the swab.      Donna Bernard, M.D.  Electronically Signed     WSL/MEDQ  D:  05/03/2007  T:  05/03/2007  Job:  [id number]

## 2011-04-01 NOTE — Discharge Summary (Signed)
Elizabeth Oneal, Elizabeth Oneal                  ACCOUNT NO.:  [id number]   MEDICAL RECORD NO.:  [id number]          PATIENT TYPE:  INP   LOCATION:  A314                          FACILITY:  [facility]   PHYSICIAN:  Donna Bernard, M.D.DATE OF BIRTH:  [birth year]   DATE OF ADMISSION:  04/24/2007  DATE OF DISCHARGE:  06/13/2008LH                               DISCHARGE SUMMARY   FINAL DIAGNOSIS:  1. Probable pseudomembranous colitis.  2. Orthostatic hypotension with dehydration, resolved.   FINAL DISPOSITION:  1. Patient discharged home.  2. Patient to maintain all home medications except Relafen.  3. Flagyl 3x a day for seven days.  4. Questran one scoop twice a day.  5. Tylenol as needed for pain.   FOLLOW UP:  In one week in the office.   INITIAL H&P:  Please see H&P as dictated.   HOSPITAL COURSE:  This patient is an 77 year old female who recently  arrived in the hospital for cat bite.  She had confirmed sepsis with  Pasteurella multocida.  She was given IV Augmentin, and then oral  Augmentin.  She took a course of this.  She developed abdominal pain  accompanied by profuse vomiting and diarrhea and severe cough and  abdominal pain.  The patient was admitted to the hospital.  Her white  blood count was normal.  Her creatinine was 1.2.  Due to ongoing  significant symptoms we did do an ultrasound.  This revealed gallstones;  however, no inflammation was noted in the gallbladder wall.  We also did  C. Difficile studies on the stool x2, both returned negative.  However,  it should be noted that the patient improved clinically on the oral  metronidazole along with Questran.  It was felt that empirically she had  C.  difficile infection even though her studies did not bear this out,  when these studies were negative and the patient has true disease.  The  patient improved slowly but gradually.  On the day of discharge she was  feeling better and was discharged home.   DIAGNOSIS AND DISPOSITION:   As noted above.      Donna Bernard, M.D.  Electronically Signed     WSL/MEDQ  D:  05/24/2007  T:  05/25/2007  Job:  [id number]

## 2011-08-11 LAB — CLOSTRIDIUM DIFFICILE EIA

## 2011-08-11 LAB — OVA AND PARASITE EXAMINATION: Ova and parasites: NONE SEEN

## 2011-08-11 LAB — STOOL CULTURE

## 2011-08-11 LAB — FECAL LACTOFERRIN, QUANT

## 2011-09-01 LAB — STOOL CULTURE

## 2011-09-01 LAB — CLOSTRIDIUM DIFFICILE EIA: C difficile Toxins A+B, EIA: NEGATIVE

## 2011-09-01 LAB — DIFFERENTIAL
Basophils Relative: 1
Eosinophils Absolute: 0.1
Eosinophils Absolute: 0.2
Eosinophils Relative: 2
Lymphocytes Relative: 25
Lymphs Abs: 1.2
Lymphs Abs: 1.3
Neutro Abs: 3
Neutrophils Relative %: 59
Neutrophils Relative %: 59

## 2011-09-01 LAB — BASIC METABOLIC PANEL
BUN: 24 — ABNORMAL HIGH
BUN: 5 — ABNORMAL LOW
CO2: 29
Calcium: 8.3 — ABNORMAL LOW
Chloride: 98
Creatinine, Ser: 1.03
Creatinine, Ser: 1.29 — ABNORMAL HIGH
GFR calc Af Amer: >60
GFR calc non Af Amer: 50 — ABNORMAL LOW
Glucose, Bld: 143 — ABNORMAL HIGH
Potassium: 3.5

## 2011-09-01 LAB — CBC
HCT: 34.9 — ABNORMAL LOW
MCHC: 34.3
MCV: 86.1
Platelets: 172
Platelets: 187
WBC: 5
WBC: 5.2

## 2011-09-01 LAB — HEPATIC FUNCTION PANEL
Alkaline Phosphatase: 56
Indirect Bilirubin: 0.3
Total Bilirubin: 0.4
Total Protein: 6.5

## 2012-03-26 ENCOUNTER — Emergency Department (HOSPITAL_COMMUNITY): Payer: Medicare Other

## 2012-03-26 ENCOUNTER — Encounter (HOSPITAL_COMMUNITY): Payer: Self-pay | Admitting: *Deleted

## 2012-03-26 ENCOUNTER — Emergency Department (HOSPITAL_COMMUNITY)
Admission: EM | Admit: 2012-03-26 | Discharge: 2012-03-26 | Disposition: A | Discharge status: Home / Self Care | Payer: Medicare Other | Attending: Emergency Medicine | Admitting: Emergency Medicine

## 2012-03-26 DIAGNOSIS — W010XXA Fall on same level from slipping, tripping and stumbling without subsequent striking against object, initial encounter: Secondary | ICD-10-CM | POA: Insufficient documentation

## 2012-03-26 DIAGNOSIS — E7800 Pure hypercholesterolemia, unspecified: Secondary | ICD-10-CM | POA: Insufficient documentation

## 2012-03-26 DIAGNOSIS — Y92009 Unspecified place in unspecified non-institutional (private) residence as the place of occurrence of the external cause: Secondary | ICD-10-CM | POA: Insufficient documentation

## 2012-03-26 DIAGNOSIS — M542 Cervicalgia: Secondary | ICD-10-CM | POA: Insufficient documentation

## 2012-03-26 DIAGNOSIS — M5137 Other intervertebral disc degeneration, lumbosacral region: Secondary | ICD-10-CM | POA: Insufficient documentation

## 2012-03-26 DIAGNOSIS — Y9301 Activity, walking, marching and hiking: Secondary | ICD-10-CM | POA: Insufficient documentation

## 2012-03-26 DIAGNOSIS — G319 Degenerative disease of nervous system, unspecified: Secondary | ICD-10-CM | POA: Insufficient documentation

## 2012-03-26 DIAGNOSIS — M549 Dorsalgia, unspecified: Secondary | ICD-10-CM | POA: Insufficient documentation

## 2012-03-26 DIAGNOSIS — Z79899 Other long term (current) drug therapy: Secondary | ICD-10-CM | POA: Insufficient documentation

## 2012-03-26 DIAGNOSIS — I10 Essential (primary) hypertension: Secondary | ICD-10-CM | POA: Insufficient documentation

## 2012-03-26 DIAGNOSIS — M51379 Other intervertebral disc degeneration, lumbosacral region without mention of lumbar back pain or lower extremity pain: Secondary | ICD-10-CM | POA: Insufficient documentation

## 2012-03-26 DIAGNOSIS — W19XXXA Unspecified fall, initial encounter: Secondary | ICD-10-CM

## 2012-03-26 HISTORY — DX: Other intervertebral disc degeneration, lumbar region: M51.36

## 2012-03-26 HISTORY — DX: Other intervertebral disc degeneration, lumbar region without mention of lumbar back pain or lower extremity pain: M51.369

## 2012-03-26 NOTE — ED Notes (Signed)
Pt dressed and up to wheelchair. Ambulated to chair with assistance

## 2012-03-26 NOTE — Discharge Instructions (Signed)
RESOURCE GUIDE  Dental Problems  Patients with Medicaid: [facility]                     [facility] [address]                                           [address] Phone:  [phone]                                                  Phone:  [phone]  If unable to pay or uninsured, contact:  [facility] or [facility] to become qualified for the adult dental clinic.  Chronic Pain Problems Contact [facility]  [phone] Patients need to be referred by their primary care doctor.  Insufficient Money for Medicine Contact [employer]:  call "211" or [facility] 271-5999.  No Primary Care Doctor Call [facility]  832-8000 Other agencies that provide inexpensive medical care    [facility]  832-8035    [facility]  832-7272    [facility]  271-5999    [facility] Clinic  832-4777    [facility]  373-0678    [facility]  272-1050  Psychological Services [facility]  832-9600 [facility]  [phone] [facility]   800 853-5163 (emergency services 641-4993)  Substance Abuse Resources [facility]  336-882-2125 [facility] 336-784-9470 [facility] 336-285-9073 [facility] 336-845-3988 Residential & Outpatient Substance Abuse Program  800-659-3381  Abuse/Neglect [place] Child Abuse Hotline (336) 641-3795 [place] Child Abuse Hotline 800-378-5315 (After Hours)  Emergency Shelter [facility] (336) 271-5985  [facility] (336) 275-9566 [facility] (704) 372-4663  MRSA Hotline #:   832-7006    [place] [facility]  [facility] [place]     [employer]                          [facility] [address] [place]                       [address]      [address]  [place]                                                [place]                            [place] Phone:  [phone]                                   Phone:  [phone]                 Phone:  342-8140  [facility] Phone:  342-8316    [place] Child Abuse Hotline (336) 342-1394 (336) 342-3537 (After Hours)    Take your usual prescriptions as previously directed.  Apply moist heat or ice to the area(s) of discomfort, for 15 minutes at a time, several times per day for the next few days.  Do not fall asleep on a heating or ice pack.  Call your regular medical doctor today to schedule a follow up appointment this week.  Return to the Emergency Department immediately if worsening.  

## 2012-03-26 NOTE — ED Provider Notes (Signed)
History   This chart was scribed for Elizabeth Anger, DO by [name]. The patient was seen in room APA16A/APA16A.   CSN: [id number]  Arrival date & time 03/26/12  1025   First MD Initiated Contact with Patient 03/26/12 1041      Chief Complaint  Patient presents with  . fall/lsb      HPI  Pt seen at 11:00.  Per pt and family, c/o sudden onset and resolution of one episode of slip and fall that began this morning PTA.  Pt states she was walking from her bedroom to the bathroom at home this morning when she lost her balance and fell.  Patient complains of lower back pain. Denies LOC, no prodromal symptoms before fall, no abd pain, no CP/SOB, no saddle anesthesia, no focal motor weakness, no tingling/numbness in extremities.    Past Medical History  Diagnosis Date  . High cholesterol   . Hypertension   . Degenerative disc disease, lumbar     Past Surgical History  Procedure Date  . Abdominal hysterectomy   . Abdominal surgery   . Cholecystectomy   . Appendectomy      History  Substance Use Topics  . Smoking status: Never Smoker   . Smokeless tobacco: Not on file  . Alcohol Use: No    Review of Systems ROS: Statement: All systems negative except as marked or noted in the HPI; Constitutional: Negative for fever and chills. ; ; Eyes: Negative for eye pain, redness and discharge. ; ; ENMT: Negative for ear pain, hoarseness, nasal congestion, sinus pressure and sore throat. ; ; Cardiovascular: Negative for chest pain, palpitations, diaphoresis, dyspnea and peripheral edema. ; ; Respiratory: Negative for cough, wheezing and stridor. ; ; Gastrointestinal: Negative for nausea, vomiting, diarrhea, abdominal pain, blood in stool, hematemesis, jaundice and rectal bleeding. . ; ; Genitourinary: Negative for dysuria, flank pain and hematuria. ; ; Musculoskeletal: +back pain. Negative for neck pain. Negative for swelling and trauma.; ; Skin: Negative for pruritus, rash, abrasions,  blisters, bruising and skin lesion.; ; Neuro: Negative for headache, lightheadedness and neck stiffness. Negative for weakness, altered level of consciousness , altered mental status, extremity weakness, paresthesias, involuntary movement, seizure and syncope.     Allergies  Codeine and Xanax  Home Medications   Current Outpatient Rx  Name Route Sig Dispense Refill  . ACETAMINOPHEN 500 MG PO TABS Oral Take 1,000 mg by mouth daily. Pain    . BUDESONIDE ER 3 MG PO CP24 Oral Take 3 mg by mouth 2 (two) times daily.     Marland Kitchen LOVASTATIN 20 MG PO TABS Oral Take 20 mg by mouth at bedtime.    Marland Kitchen METOPROLOL TARTRATE 25 MG PO TABS Oral Take 25 mg by mouth 2 (two) times daily.     . CENTRUM PO Oral Take 1 tablet by mouth daily.    . SENNA 8.6 MG PO TABS Oral Take 2 tablets by mouth daily.    . TRAMADOL HCL 50 MG PO TABS Oral Take 50 mg by mouth 2 (two) times daily. Pain    . TRIAMTERENE-HCTZ 37.5-25 MG PO CAPS Oral Take 1 capsule by mouth daily.       BP 143/66  Pulse 95  Temp(Src) 98.4 F (36.9 C) (Oral)  Resp 16  Ht 5\' 2"  (1.575 m)  Wt 130 lb (58.968 kg)  BMI 23.78 kg/m2  SpO2 95%  Physical Exam 1105: Physical examination: Vital signs and O2 SAT: Reviewed; Constitutional: Well developed, Well  nourished, In no acute distress; Head and Face: Normocephalic, Atraumatic; Eyes: EOMI, PERRL, No scleral icterus; ENMT: Mouth and pharynx normal, Mucous membranes dry; Neck: Immobilized in C-collar, Trachea midline; Spine: Immobilized on spineboard, No midline CS, TS, LS tenderness. +mild TTP left thoracic and lumbar paraspinal muscles.; Cardiovascular: Regular rate and rhythm, No murmur or gallop; Respiratory: Breath sounds clear & equal bilaterally, No rales, rhonchi, wheezes, speaking full sentences with ease. Normal respiratory effort/excursion; Chest: Nontender, No deformity, Movement normal, No crepitus, No abrasions or ecchymosis.; Abdomen: Soft, Nontender, Nondistended, Normal bowel sounds, No  abrasions or ecchymosis.; Genitourinary: No CVA tenderness; Extremities: No deformity, Full range of motion, Neurovascularly intact, Pulses normal, No tenderness, No edema, Pelvis stable; Neuro: AA&Ox3, GCS 15.  Major CN grossly intact. Speech clear. No facial droop. Strength 5/5 equal bilat UE's and LE's, Moves all ext well w/o apparent gross focal motor deficits, no drift x4 extremities; Skin: Color normal, Warm, Dry.   ED Course  Procedures    MDM  MDM Reviewed: nursing note and vitals Interpretation: x-ray and CT scan    Dg Ribs Unilateral W/chest Left 03/26/2012  *RADIOLOGY REPORT*  Clinical Data: Pain post fall  LEFT RIBS AND CHEST - 3+ VIEW  Comparison: Chest radiograph 03/25/2010  Findings: Enlargement of cardiac silhouette. Calcified right paratracheal adenopathy consistent with old granulomatous disease. Calcified tortuous aorta. Pulmonary vascular congestion. Minimal chronic bibasilar atelectasis and chronic bronchitic changes. No definite acute infiltrate, pleural effusion or pneumothorax. Severe osseous demineralization. Numerous surgical clips in abdomen. No definite left rib fracture or bone destruction.  IMPRESSION: Chronic bronchitic changes with bibasilar atelectasis. Enlargement of cardiac silhouette with mild pulmonary vascular congestion. No definite acute left rib abnormalities.  Original Report Authenticated By: Lollie Marrow, M.D.   Dg Lumbar Spine Complete 03/26/2012  *RADIOLOGY REPORT*  Clinical Data: Pain, fall  LUMBAR SPINE - COMPLETE 4+ VIEW  Comparison: 04/09/2004  Findings: Osseous demineralization. Five non-rib bearing lumbar vertebrae. Multilevel disc space narrowing and endplate spur formation. Multilevel facet degenerative changes lower lumbar spine. Vertebral body heights appear grossly maintained. No definite acute fracture, dislocation, or bone destruction. Extensive atherosclerotic calcifications aorta and iliac arteries. Numerous surgical clips in abdomen and  pelvis. No definite spondylolysis identified. SI joints poorly defined bilaterally.  IMPRESSION: Severe osseous demineralization. Multilevel degenerative disc and facet disease changes of the lumbar spine. No definite acute bony abnormalities.  Original Report Authenticated By: Lollie Marrow, M.D.   Dg Pelvis 1-2 Views 03/26/2012  *RADIOLOGY REPORT*  Clinical Data: Pain post fall  PELVIS - 1-2 VIEW  Comparison: 02/11/2010  Findings: Osseous demineralization. Mild bilateral hip joint space narrowing. Degenerative disk and facet disease changes lower lumbar spine. Scattered atherosclerotic calcifications and phleboliths. No definite acute fracture, dislocation, or bone destruction.  IMPRESSION: Osseous demineralization. No definite acute abnormalities.  Original Report Authenticated By: Lollie Marrow, M.D.   Ct Head Wo Contrast 03/26/2012  *RADIOLOGY REPORT*  Clinical Data:  Fall with trauma to the head and neck.  CT HEAD WITHOUT CONTRAST CT CERVICAL SPINE WITHOUT CONTRAST  Technique:  Multidetector CT imaging of the head and cervical spine was performed following the standard protocol without intravenous contrast.  Multiplanar CT image reconstructions of the cervical spine were also generated.  Comparison:  06/16/2008  CT HEAD  Findings: The brain shows generalized atrophy with prominence of the ventricles and sulci.  There are chronic small vessel changes within the deep white matter.  No cortical or large vessel territory infarction.  Ventricular enlargement is felt  to be on the basis of central atrophy.  Normal pressure hydrocephalus cannot be excluded but is not favored.  Ventricles are larger than were seen in 2009 but this is presumed to be on the basis of progressive atrophy.  No sign of acute infarction, mass lesion or hemorrhage. No skull fracture.  No fluid in the sinuses, middle ears or mastoids.  There is extensive chronic calcification of the major vessels at the base of the brain.  IMPRESSION:  Chronic brain atrophy.  Ventricles are more prominent than were seen in 2009, presumably due to progressive atrophy.  One could not rule out the possibility of normal pressure hydrocephalus but that is not particularly favored.  CT CERVICAL SPINE  Findings: Alignment is normal.  No evidence of fracture.  There is ordinary degenerative arthritis at the C1-2 articulation, of the right-sided facet at C2-3, and of the left sided facets at C3-4 and C4-5.  There is chronic degenerative spondylosis with disc space narrowing and osteophytes at C3-4, C4-5, C5-6 and C6-7.  No severe stenosis identified. Soft tissues are unremarkable except for a nonspecific 15 mm nodule in the right lobe of the thyroid.  IMPRESSION: No acute or traumatic finding.  Ordinary degenerative changes as outlined above.  Nonspecific 15 mm nodule in the right lobe of the thyroid.  Original Report Authenticated By: Thomasenia Sales, M.D.   Ct Cervical Spine Wo Contrast 03/26/2012  *RADIOLOGY REPORT*  Clinical Data:  Fall with trauma to the head and neck.  CT HEAD WITHOUT CONTRAST CT CERVICAL SPINE WITHOUT CONTRAST  Technique:  Multidetector CT imaging of the head and cervical spine was performed following the standard protocol without intravenous contrast.  Multiplanar CT image reconstructions of the cervical spine were also generated.  Comparison:  06/16/2008  CT HEAD  Findings: The brain shows generalized atrophy with prominence of the ventricles and sulci.  There are chronic small vessel changes within the deep white matter.  No cortical or large vessel territory infarction.  Ventricular enlargement is felt to be on the basis of central atrophy.  Normal pressure hydrocephalus cannot be excluded but is not favored.  Ventricles are larger than were seen in 2009 but this is presumed to be on the basis of progressive atrophy.  No sign of acute infarction, mass lesion or hemorrhage. No skull fracture.  No fluid in the sinuses, middle ears or mastoids.   There is extensive chronic calcification of the major vessels at the base of the brain.  IMPRESSION: Chronic brain atrophy.  Ventricles are more prominent than were seen in 2009, presumably due to progressive atrophy.  One could not rule out the possibility of normal pressure hydrocephalus but that is not particularly favored.  CT CERVICAL SPINE  Findings: Alignment is normal.  No evidence of fracture.  There is ordinary degenerative arthritis at the C1-2 articulation, of the right-sided facet at C2-3, and of the left sided facets at C3-4 and C4-5.  There is chronic degenerative spondylosis with disc space narrowing and osteophytes at C3-4, C4-5, C5-6 and C6-7.  No severe stenosis identified. Soft tissues are unremarkable except for a nonspecific 15 mm nodule in the right lobe of the thyroid.  IMPRESSION: No acute or traumatic finding.  Ordinary degenerative changes as outlined above.  Nonspecific 15 mm nodule in the right lobe of the thyroid.  Original Report Authenticated By: Thomasenia Sales, M.D.      3:07 PM:  Family states pt continues at her baseline, want to take her home  now.  Pt wants to go home.  VSS, resps easy, neuro exam unchanged.  Dx testing d/w pt and family.  Questions answered.  Verb understanding, agreeable to d/c home with outpt f/u.     I personally performed the services described in this documentation, which was scribed in my presence. The recorded information has been reviewed and considered. Tarren Velardi Allison Quarry, DO 03/28/12 1835

## 2012-03-26 NOTE — ED Notes (Signed)
Pt reports fell this morning approx 0900 while ambulating to bathroom.  Denies hitting head/loc.  C/o upper to lower back pain.  Pt alert and oriented x 4 at this time.  nad noted.  lsb and towel roll for c-spine upon arrival.

## 2012-03-26 NOTE — ED Notes (Signed)
Cleaned and dried pt after large BM.  nad noted.

## 2012-07-30 ENCOUNTER — Emergency Department (HOSPITAL_COMMUNITY)
Admission: EM | Admit: 2012-07-30 | Discharge: 2012-07-30 | Disposition: A | Discharge status: Home / Self Care | Payer: Medicare Other | Attending: Emergency Medicine | Admitting: Emergency Medicine

## 2012-07-30 ENCOUNTER — Emergency Department (HOSPITAL_COMMUNITY): Payer: Medicare Other

## 2012-07-30 ENCOUNTER — Encounter (HOSPITAL_COMMUNITY): Payer: Self-pay | Admitting: *Deleted

## 2012-07-30 DIAGNOSIS — Z888 Allergy status to other drugs, medicaments and biological substances status: Secondary | ICD-10-CM | POA: Insufficient documentation

## 2012-07-30 DIAGNOSIS — R059 Cough, unspecified: Secondary | ICD-10-CM | POA: Insufficient documentation

## 2012-07-30 DIAGNOSIS — I10 Essential (primary) hypertension: Secondary | ICD-10-CM | POA: Insufficient documentation

## 2012-07-30 DIAGNOSIS — Z885 Allergy status to narcotic agent status: Secondary | ICD-10-CM | POA: Insufficient documentation

## 2012-07-30 DIAGNOSIS — R05 Cough: Secondary | ICD-10-CM

## 2012-07-30 HISTORY — DX: Enterocolitis due to Clostridium difficile, not specified as recurrent: A04.72

## 2012-07-30 MED ORDER — LIDOCAINE HCL (PF) 1 % IJ SOLN
INTRAMUSCULAR | Status: AC
  Administered 2012-07-30: 2.1 mL
  Filled 2012-07-30: qty 5

## 2012-07-30 MED ORDER — CEFTRIAXONE SODIUM 1 G IJ SOLR
1.0000 g | Freq: Once | INTRAMUSCULAR | Status: AC
  Administered 2012-07-30: 1 g via INTRAMUSCULAR
  Filled 2012-07-30: qty 10

## 2012-07-30 MED ORDER — AZITHROMYCIN 250 MG PO TABS: 250.0000 mg | ORAL_TABLET | Freq: Every day | ORAL | Status: DC

## 2012-07-30 MED ORDER — AZITHROMYCIN 250 MG PO TABS
500.0000 mg | ORAL_TABLET | Freq: Once | ORAL | Status: AC
  Administered 2012-07-30: 500 mg via ORAL
  Filled 2012-07-30: qty 2

## 2012-07-30 NOTE — ED Notes (Signed)
Chest congestion, no fever.

## 2012-07-30 NOTE — ED Provider Notes (Signed)
History     CSN: [id number]  Arrival date & time 07/30/12  1340   First MD Initiated Contact with Patient 07/30/12 1721      Chief Complaint  Patient presents with  . Cough    (Consider location/radiation/quality/duration/timing/severity/associated sxs/prior treatment) Patient is a 78 y.o. female presenting with cough. The history is provided by the patient (the pt complains of a cough). No language interpreter was used.  Cough This is a new problem. The current episode started 2 days ago. The problem occurs constantly. The problem has not changed since onset.The cough is non-productive. There has been no fever. Associated symptoms include chest pain. Pertinent negatives include no headaches. The treatment provided no relief. She is not a smoker. Her past medical history does not include pneumonia.    Past Medical History  Diagnosis Date  . High cholesterol   . Hypertension   . Degenerative disc disease, lumbar   . Colitis   . C. difficile colitis     Past Surgical History  Procedure Date  . Abdominal hysterectomy   . Cholecystectomy   . Appendectomy   . Abdominal surgery   . Fracture surgery     History reviewed. No pertinent family history.  History  Substance Use Topics  . Smoking status: Never Smoker   . Smokeless tobacco: Not on file  . Alcohol Use: No    OB History    Grav Para Term Preterm Abortions TAB SAB Ect Mult Living                  Review of Systems  Constitutional: Negative for fatigue.  HENT: Negative for congestion, sinus pressure and ear discharge.   Eyes: Negative for discharge.  Respiratory: Positive for cough.   Cardiovascular: Positive for chest pain.  Gastrointestinal: Negative for abdominal pain and diarrhea.  Genitourinary: Negative for frequency and hematuria.  Musculoskeletal: Negative for back pain.  Skin: Negative for rash.  Neurological: Negative for seizures and headaches.  Hematological: Negative.     Psychiatric/Behavioral: Negative for hallucinations.    Allergies  Codeine; Morphine and related; and Xanax  Home Medications   Current Outpatient Rx  Name Route Sig Dispense Refill  . ACETAMINOPHEN 500 MG PO TABS Oral Take 1,000 mg by mouth daily. Pain    . AZITHROMYCIN 250 MG PO TABS Oral Take 1 tablet (250 mg total) by mouth daily. 4 tablet 0  . BUDESONIDE ER 3 MG PO CP24 Oral Take 3 mg by mouth 2 (two) times daily.     Marland Kitchen LOVASTATIN 20 MG PO TABS Oral Take 20 mg by mouth at bedtime.    Marland Kitchen METOPROLOL TARTRATE 25 MG PO TABS Oral Take 25 mg by mouth 2 (two) times daily.     . CENTRUM PO Oral Take 1 tablet by mouth daily.    . SENNA 8.6 MG PO TABS Oral Take 2 tablets by mouth daily.    . TRAMADOL HCL 50 MG PO TABS Oral Take 50 mg by mouth 2 (two) times daily. Pain    . TRIAMTERENE-HCTZ 37.5-25 MG PO CAPS Oral Take 1 capsule by mouth daily.       BP 132/57  Pulse 88  Temp 98 F (36.7 C) (Oral)  Resp 20  Ht 5\' 2"  (1.575 m)  Wt 140 lb (63.504 kg)  BMI 25.61 kg/m2  SpO2 96%  Physical Exam  Constitutional: She is oriented to person, place, and time. She appears well-developed.  HENT:  Head: Normocephalic and atraumatic.  Eyes:  Conjunctivae normal and EOM are normal. No scleral icterus.  Neck: Neck supple. No thyromegaly present.  Cardiovascular: Normal rate and regular rhythm.  Exam reveals no gallop and no friction rub.   No murmur heard. Pulmonary/Chest: No stridor. She has no wheezes. She has no rales. She exhibits no tenderness.  Abdominal: She exhibits no distension. There is no tenderness. There is no rebound.  Musculoskeletal: Normal range of motion. She exhibits no edema.  Lymphadenopathy:    She has no cervical adenopathy.  Neurological: She is oriented to person, place, and time. Coordination normal.  Skin: No rash noted. No erythema.  Psychiatric: She has a normal mood and affect. Her behavior is normal.    ED Course  Procedures (including critical care  time)  Labs Reviewed - No data to display Dg Chest 1 View  07/30/2012  *RADIOLOGY REPORT*  Clinical Data: Cough, hypertension  CHEST - 1 VIEW  Comparison: 03/26/2012  Findings: Patient was unable to stand for a lateral view. Upper normal heart size. Atherosclerotic calcifications aorta. Mediastinal contours and pulmonary vascularity normal. Calcified right paratracheal adenopathy. Decreased lung volumes with minimal bronchitic changes and bibasilar atelectasis versus scarring. No definite acute infiltrate, pleural effusion or pneumothorax. Diffuse osseous demineralization.  IMPRESSION: Chronic bronchitic and old granulomatous disease changes. No acute abnormalities.   Original Report Authenticated By: Lollie Marrow, M.D.      1. Cough       MDM          Benny Lennert, MD 07/30/12 1745

## 2012-07-30 NOTE — ED Notes (Signed)
Pt given meds as ordered, pt's family requesting to speak to mp prior to discharge.  Dr. Estell Harpin notified

## 2013-02-28 ENCOUNTER — Encounter: Payer: Self-pay | Admitting: *Deleted

## 2013-03-04 ENCOUNTER — Encounter: Payer: Self-pay | Admitting: Family Medicine

## 2013-03-04 ENCOUNTER — Ambulatory Visit (INDEPENDENT_AMBULATORY_CARE_PROVIDER_SITE_OTHER): Payer: Medicare Other | Admitting: Family Medicine

## 2013-03-04 VITALS — BP 112/62 | HR 60

## 2013-03-04 DIAGNOSIS — K5289 Other specified noninfective gastroenteritis and colitis: Secondary | ICD-10-CM

## 2013-03-04 DIAGNOSIS — E785 Hyperlipidemia, unspecified: Secondary | ICD-10-CM

## 2013-03-04 DIAGNOSIS — R5381 Other malaise: Secondary | ICD-10-CM

## 2013-03-04 DIAGNOSIS — I10 Essential (primary) hypertension: Secondary | ICD-10-CM

## 2013-03-04 DIAGNOSIS — R5383 Other fatigue: Secondary | ICD-10-CM

## 2013-03-04 MED ORDER — TRAMADOL HCL 50 MG PO TABS: 50.0000 mg | ORAL_TABLET | Freq: Three times a day (TID) | ORAL | Status: DC | PRN

## 2013-03-04 MED ORDER — BUDESONIDE 3 MG PO CP24: 3.0000 mg | ORAL_CAPSULE | Freq: Two times a day (BID) | ORAL | Status: DC

## 2013-03-04 MED ORDER — TRIAMTERENE-HCTZ 37.5-25 MG PO CAPS: 1.0000 | ORAL_CAPSULE | Freq: Every day | ORAL | Status: DC

## 2013-03-04 MED ORDER — METOPROLOL TARTRATE 25 MG PO TABS: 25.0000 mg | ORAL_TABLET | Freq: Two times a day (BID) | ORAL | Status: DC

## 2013-03-04 MED ORDER — LOVASTATIN 20 MG PO TABS: 20.0000 mg | ORAL_TABLET | Freq: Every day | ORAL | Status: DC

## 2013-03-04 NOTE — Progress Notes (Signed)
  Subjective:    Patient ID: STARKISHA TULLIS, female    DOB: [birth year], 79 y.o.   MRN: [id number]  Hypertension This is a chronic problem. The current episode started more than 1 year ago. The problem is unchanged. The problem is controlled. Associated symptoms include anxiety. There are no associated agents to hypertension.   Patient also has history of chronic pain, primarily due to arthritis. She reports that she needs to take more than 2 tramadol per day to help her pain. Patient has history of renal insufficiency, she has not had blood work for quite some time. Family is resistant once again today beginning blood work. Patient has history of hyperlipidemia. She claims compliance with diet. Patient has history of depression states fairly stable.   Review of Systems    review systems otherwise negative. Objective:   Physical Exam  Alert no acute distress. HEENT normal. Vitals reviewed. Lungs clear. Heart regular rate and rhythm. Ankles without edema.      Assessment & Plan:  Impression #1 hypertension good control. #2 chronic pain secondary to arthritis worsening. #3 chronic depression anxiety stable. #4 renal insufficiency status on certain. #5 venous stasis clinically stable. Plan appropriate blood work. Diet exercise discussed. Check every 6 months. WSL

## 2013-03-05 LAB — HEPATIC FUNCTION PANEL
ALT: 10 U/L (ref 0–35)
Albumin: 4.4 g/dL (ref 3.5–5.2)
Bilirubin, Direct: 0.1 mg/dL (ref 0.0–0.3)
Total Protein: 7.2 g/dL (ref 6.0–8.3)

## 2013-03-05 LAB — BASIC METABOLIC PANEL
Glucose, Bld: 98 mg/dL (ref 70–99)
Potassium: 4.7 mEq/L (ref 3.5–5.3)
Sodium: 140 mEq/L (ref 135–145)

## 2013-03-05 LAB — LIPID PANEL
Cholesterol: 198 mg/dL (ref 0–200)
Total CHOL/HDL Ratio: 4.3 Ratio
VLDL: 47 mg/dL — ABNORMAL HIGH (ref 0–40)

## 2013-03-07 ENCOUNTER — Encounter: Payer: Self-pay | Admitting: *Deleted

## 2013-03-08 ENCOUNTER — Other Ambulatory Visit: Payer: Self-pay | Admitting: *Deleted

## 2013-03-08 ENCOUNTER — Other Ambulatory Visit: Payer: Self-pay | Admitting: Family Medicine

## 2013-03-08 MED ORDER — METOPROLOL TARTRATE 25 MG PO TABS: 25.0000 mg | ORAL_TABLET | Freq: Two times a day (BID) | ORAL | Status: DC

## 2013-03-08 MED ORDER — LOVASTATIN 20 MG PO TABS: 20.0000 mg | ORAL_TABLET | Freq: Every day | ORAL | Status: DC

## 2013-03-08 MED ORDER — BUDESONIDE 3 MG PO CP24: 3.0000 mg | ORAL_CAPSULE | Freq: Two times a day (BID) | ORAL | Status: DC

## 2013-03-08 NOTE — Telephone Encounter (Signed)
Spoke with wife regarding medication re-order to [employer] order.

## 2013-03-11 ENCOUNTER — Other Ambulatory Visit: Payer: Self-pay | Admitting: *Deleted

## 2013-03-11 MED ORDER — LOVASTATIN 20 MG PO TABS: 20.0000 mg | ORAL_TABLET | Freq: Every day | ORAL | Status: DC

## 2013-03-11 MED ORDER — METOPROLOL TARTRATE 25 MG PO TABS: 25.0000 mg | ORAL_TABLET | Freq: Two times a day (BID) | ORAL | Status: DC

## 2013-08-12 ENCOUNTER — Other Ambulatory Visit: Payer: Self-pay | Admitting: *Deleted

## 2013-08-12 MED ORDER — TRAMADOL HCL 50 MG PO TABS: 50.0000 mg | ORAL_TABLET | Freq: Three times a day (TID) | ORAL | Status: DC | PRN

## 2013-08-15 ENCOUNTER — Other Ambulatory Visit: Payer: Self-pay | Admitting: Family Medicine

## 2013-09-07 ENCOUNTER — Other Ambulatory Visit: Payer: Self-pay | Admitting: Family Medicine

## 2013-10-04 ENCOUNTER — Other Ambulatory Visit: Payer: Self-pay | Admitting: Family Medicine

## 2013-11-04 ENCOUNTER — Other Ambulatory Visit: Payer: Self-pay | Admitting: Family Medicine

## 2013-11-28 ENCOUNTER — Other Ambulatory Visit: Payer: Self-pay | Admitting: Family Medicine

## 2013-12-03 ENCOUNTER — Other Ambulatory Visit: Payer: Self-pay | Admitting: Family Medicine

## 2013-12-30 ENCOUNTER — Ambulatory Visit (INDEPENDENT_AMBULATORY_CARE_PROVIDER_SITE_OTHER): Payer: Medicare Other | Admitting: Family Medicine

## 2013-12-30 ENCOUNTER — Encounter: Payer: Self-pay | Admitting: Family Medicine

## 2013-12-30 VITALS — BP 110/72 | Ht 62.0 in | Wt 160.0 lb

## 2013-12-30 DIAGNOSIS — K219 Gastro-esophageal reflux disease without esophagitis: Secondary | ICD-10-CM

## 2013-12-30 DIAGNOSIS — E785 Hyperlipidemia, unspecified: Secondary | ICD-10-CM

## 2013-12-30 DIAGNOSIS — I10 Essential (primary) hypertension: Secondary | ICD-10-CM

## 2013-12-30 MED ORDER — LOVASTATIN 20 MG PO TABS: 20.0000 mg | ORAL_TABLET | Freq: Every day | ORAL | Status: DC

## 2013-12-30 MED ORDER — METOPROLOL TARTRATE 25 MG PO TABS: 25.0000 mg | ORAL_TABLET | Freq: Two times a day (BID) | ORAL | Status: DC

## 2013-12-30 MED ORDER — TRAMADOL HCL 50 MG PO TABS: 50.0000 mg | ORAL_TABLET | Freq: Three times a day (TID) | ORAL | Status: DC | PRN

## 2013-12-30 MED ORDER — TRIAMTERENE-HCTZ 37.5-25 MG PO CAPS: 1.0000 | ORAL_CAPSULE | Freq: Every day | ORAL | Status: DC

## 2013-12-30 MED ORDER — BUDESONIDE 3 MG PO CP24: 3.0000 mg | ORAL_CAPSULE | Freq: Two times a day (BID) | ORAL | Status: DC

## 2013-12-30 NOTE — Progress Notes (Signed)
   Subjective:    Patient ID: ARMYA WESTERHOFF, female    DOB: 09/29/1916, 80 y.o.   MRN: [id number]  HPIHypertension. No concerns. Requesting all meds refilled for 90 days.  Patient has history of hypertension. Claims compliance with medication. No obvious side effects. Next  History of hyperlipidemia. Blood work last summer showed good management of numbers. Compliant with medication. No obvious side effects.  Chronic pain. Primarily in the joints. Notes stable while taking medicine.  Mobility is severely limited. Currently wears pull-ups. Family checks in on her multiple times per day.  Claims overall good appetite. Would like scripts printed to take home.    Eats good variety of foods   Review of Systems No headache no chest pain no back pain no abdominal pain no change in bowel habits no blood in stool ROS otherwise than    Objective:   Physical Exam Alert. No acute distress. Sitting in a wheelchair. Vital stable. HEENT normal. Lungs clear. Heart regular in rhythm. Ankles without edema.       Assessment & Plan:  Impression 1 hypertension good control. #2 hyperlipidemia recent good control. #3 reflux clinically stable #4 chronic pain mostly from arthritis. Plan maintain all meds. Exercise discussed. Diet discussed. No blood work at this time. Check every 6 months.

## 2014-03-10 ENCOUNTER — Other Ambulatory Visit: Payer: Self-pay | Admitting: Family Medicine

## 2014-06-17 ENCOUNTER — Other Ambulatory Visit: Payer: Self-pay | Admitting: Family Medicine

## 2014-10-18 ENCOUNTER — Other Ambulatory Visit: Payer: Self-pay | Admitting: Family Medicine

## 2015-03-18 ENCOUNTER — Ambulatory Visit (INDEPENDENT_AMBULATORY_CARE_PROVIDER_SITE_OTHER): Payer: Medicare Other | Admitting: Family Medicine

## 2015-03-18 ENCOUNTER — Encounter: Payer: Self-pay | Admitting: Family Medicine

## 2015-03-18 VITALS — BP 112/74 | Ht 62.0 in | Wt 160.0 lb

## 2015-03-18 DIAGNOSIS — I10 Essential (primary) hypertension: Secondary | ICD-10-CM | POA: Diagnosis not present

## 2015-03-18 DIAGNOSIS — Z8659 Personal history of other mental and behavioral disorders: Secondary | ICD-10-CM | POA: Diagnosis not present

## 2015-03-18 DIAGNOSIS — R54 Age-related physical debility: Secondary | ICD-10-CM | POA: Diagnosis not present

## 2015-03-18 DIAGNOSIS — E785 Hyperlipidemia, unspecified: Secondary | ICD-10-CM

## 2015-03-18 DIAGNOSIS — Z79899 Other long term (current) drug therapy: Secondary | ICD-10-CM | POA: Diagnosis not present

## 2015-03-18 MED ORDER — METOPROLOL TARTRATE 25 MG PO TABS: 25.0000 mg | ORAL_TABLET | Freq: Two times a day (BID) | ORAL | Status: DC

## 2015-03-18 MED ORDER — BUDESONIDE 3 MG PO CPEP: ORAL_CAPSULE | ORAL | Status: DC

## 2015-03-18 MED ORDER — LOVASTATIN 20 MG PO TABS: 20.0000 mg | ORAL_TABLET | Freq: Every day | ORAL | Status: DC

## 2015-03-18 MED ORDER — TRAMADOL HCL 50 MG PO TABS: 50.0000 mg | ORAL_TABLET | Freq: Three times a day (TID) | ORAL | Status: DC | PRN

## 2015-03-18 MED ORDER — TRIAMTERENE-HCTZ 37.5-25 MG PO CAPS
1.0000 | ORAL_CAPSULE | Freq: Every day | ORAL | Status: DC
Start: 2015-03-18 — End: 2015-12-26

## 2015-03-18 NOTE — Progress Notes (Signed)
   Subjective:    Patient ID: Elizabeth Oneal, female    DOB: 1916-05-20, 81 y.o.   MRN: [id number]  HPI Elizabeth Oneal potentially has a room for the patient.  Was seen 15 months ago. Was advised at that time to recheck in 6 months. Did not come in for follow-up.  Family claims compliance with all medications.  Has not had blood work for 2 years.  Becoming extremely forgetful. Also not engaging. Often will not speak for prolonged periods. We'll not get up out of her chair. Now requiring wheelchair to get around.  Family states they have reached her limit as far as home care. Working on getting her into a nursing home setting.  Patient has had progressive dill debility in recent years. She experienced a fracture in her leg. This led to minimal ambulation.  History of somewhat depressed affect. Patient admits to no depression. Much more forgetful per family. Periods bordering on confusion. - Debbie powell   Review of Systems Patient claims no headache no chest pain no abdominal pain no change in bowel habits. Appetite fair per family. No substantial weight loss or weight gain. Fair compliance with decent diet.    Objective:   Physical Exam  Alert sitting in a wheelchair no acute distress. Vitals stable. Blood pressure good on repeat. HEENT normal. Lungs clear. Heart regular rhythm. Abdomen no discrete tenderness ankles trace edema ambulation not observed      Assessment & Plan:  Impression 1 progressive frailty #2 hyperlipidemia status uncertain. #3 hypertension reasonable control. #4 history depression stable per patient #5 likely early dementia full MMSE testing not done today. #6 history of chronic coli rhinitis. Patient still uses Entocort daily. Plan maintain medications. Fill out [place] [facility] nursing home purposes. WSL

## 2015-03-20 LAB — BASIC METABOLIC PANEL
BUN / CREAT RATIO: 25 (ref 11–26)
BUN: 29 mg/dL (ref 10–36)
CHLORIDE: 93 mmol/L — AB (ref 97–108)
CO2: 23 mmol/L (ref 18–29)
CREATININE: 1.17 mg/dL — AB (ref 0.57–1.00)
Calcium: 9.8 mg/dL (ref 8.7–10.3)
GFR calc Af Amer: 45 mL/min/{1.73_m2} — ABNORMAL LOW (ref >59)
GFR, EST NON AFRICAN AMERICAN: 39 mL/min/{1.73_m2} — AB (ref >59)
GLUCOSE: 114 mg/dL — AB (ref 65–99)
Potassium: 4.4 mmol/L (ref 3.5–5.2)
Sodium: 135 mmol/L (ref 134–144)

## 2015-03-20 LAB — HEPATIC FUNCTION PANEL
ALK PHOS: 69 IU/L (ref 39–117)
ALT: 14 IU/L (ref 0–32)
AST: 19 IU/L (ref 0–40)
Albumin: 4.4 g/dL (ref 3.2–4.6)
BILIRUBIN TOTAL: 0.5 mg/dL (ref 0.0–1.2)
BILIRUBIN, DIRECT: 0.14 mg/dL (ref 0.00–0.40)
TOTAL PROTEIN: 6.8 g/dL (ref 6.0–8.5)

## 2015-03-20 LAB — LIPID PANEL
Chol/HDL Ratio: 3.7 ratio units (ref 0.0–4.4)
Cholesterol, Total: 203 mg/dL — ABNORMAL HIGH (ref 100–199)
HDL: 55 mg/dL (ref >39)
LDL CALC: 119 mg/dL — AB (ref 0–99)
Triglycerides: 146 mg/dL (ref 0–149)
VLDL Cholesterol Cal: 29 mg/dL (ref 5–40)

## 2015-03-22 DIAGNOSIS — Z8659 Personal history of other mental and behavioral disorders: Secondary | ICD-10-CM | POA: Insufficient documentation

## 2015-03-22 DIAGNOSIS — R54 Age-related physical debility: Secondary | ICD-10-CM | POA: Insufficient documentation

## 2015-03-25 ENCOUNTER — Encounter: Payer: Self-pay | Admitting: Family Medicine

## 2015-09-16 ENCOUNTER — Other Ambulatory Visit (HOSPITAL_COMMUNITY): Payer: Self-pay | Admitting: Internal Medicine

## 2015-09-16 ENCOUNTER — Ambulatory Visit (HOSPITAL_COMMUNITY)
Admission: RE | Admit: 2015-09-16 | Discharge: 2015-09-16 | Disposition: A | Discharge status: Home / Self Care | Payer: Medicare Other | Source: Ambulatory Visit | Attending: Internal Medicine | Admitting: Internal Medicine

## 2015-09-16 DIAGNOSIS — R05 Cough: Secondary | ICD-10-CM

## 2015-09-16 DIAGNOSIS — R509 Fever, unspecified: Secondary | ICD-10-CM | POA: Diagnosis not present

## 2015-09-16 DIAGNOSIS — R059 Cough, unspecified: Secondary | ICD-10-CM

## 2015-09-16 DIAGNOSIS — R0602 Shortness of breath: Secondary | ICD-10-CM | POA: Insufficient documentation

## 2015-12-26 ENCOUNTER — Emergency Department (HOSPITAL_COMMUNITY): Payer: Medicare Other

## 2015-12-26 ENCOUNTER — Encounter (HOSPITAL_COMMUNITY): Payer: Self-pay | Admitting: Emergency Medicine

## 2015-12-26 ENCOUNTER — Inpatient Hospital Stay (HOSPITAL_COMMUNITY)
Admission: EM | Admit: 2015-12-26 | Discharge: 2016-01-01 | DRG: 871 | Disposition: A | Discharge status: Skilled Nursing Facility | Payer: Medicare Other | Attending: Family Medicine | Admitting: Family Medicine

## 2015-12-26 DIAGNOSIS — E785 Hyperlipidemia, unspecified: Secondary | ICD-10-CM | POA: Diagnosis present

## 2015-12-26 DIAGNOSIS — A047 Enterocolitis due to Clostridium difficile: Secondary | ICD-10-CM | POA: Diagnosis present

## 2015-12-26 DIAGNOSIS — I472 Ventricular tachycardia: Secondary | ICD-10-CM | POA: Diagnosis not present

## 2015-12-26 DIAGNOSIS — I447 Left bundle-branch block, unspecified: Secondary | ICD-10-CM | POA: Diagnosis present

## 2015-12-26 DIAGNOSIS — J9601 Acute respiratory failure with hypoxia: Secondary | ICD-10-CM | POA: Diagnosis present

## 2015-12-26 DIAGNOSIS — Z8701 Personal history of pneumonia (recurrent): Secondary | ICD-10-CM | POA: Diagnosis not present

## 2015-12-26 DIAGNOSIS — K219 Gastro-esophageal reflux disease without esophagitis: Secondary | ICD-10-CM | POA: Diagnosis present

## 2015-12-26 DIAGNOSIS — E7800 Pure hypercholesterolemia, unspecified: Secondary | ICD-10-CM | POA: Diagnosis present

## 2015-12-26 DIAGNOSIS — N179 Acute kidney failure, unspecified: Secondary | ICD-10-CM | POA: Diagnosis present

## 2015-12-26 DIAGNOSIS — A414 Sepsis due to anaerobes: Secondary | ICD-10-CM | POA: Diagnosis present

## 2015-12-26 DIAGNOSIS — A419 Sepsis, unspecified organism: Secondary | ICD-10-CM | POA: Diagnosis not present

## 2015-12-26 DIAGNOSIS — I5020 Unspecified systolic (congestive) heart failure: Secondary | ICD-10-CM | POA: Diagnosis present

## 2015-12-26 DIAGNOSIS — D638 Anemia in other chronic diseases classified elsewhere: Secondary | ICD-10-CM | POA: Diagnosis present

## 2015-12-26 DIAGNOSIS — I255 Ischemic cardiomyopathy: Secondary | ICD-10-CM | POA: Diagnosis present

## 2015-12-26 DIAGNOSIS — Z66 Do not resuscitate: Secondary | ICD-10-CM | POA: Diagnosis present

## 2015-12-26 DIAGNOSIS — R059 Cough, unspecified: Secondary | ICD-10-CM

## 2015-12-26 DIAGNOSIS — I130 Hypertensive heart and chronic kidney disease with heart failure and stage 1 through stage 4 chronic kidney disease, or unspecified chronic kidney disease: Secondary | ICD-10-CM | POA: Diagnosis present

## 2015-12-26 DIAGNOSIS — R0902 Hypoxemia: Secondary | ICD-10-CM | POA: Diagnosis present

## 2015-12-26 DIAGNOSIS — Z8249 Family history of ischemic heart disease and other diseases of the circulatory system: Secondary | ICD-10-CM

## 2015-12-26 DIAGNOSIS — I959 Hypotension, unspecified: Secondary | ICD-10-CM | POA: Diagnosis present

## 2015-12-26 DIAGNOSIS — N183 Chronic kidney disease, stage 3 unspecified: Secondary | ICD-10-CM | POA: Diagnosis present

## 2015-12-26 DIAGNOSIS — E861 Hypovolemia: Secondary | ICD-10-CM | POA: Diagnosis present

## 2015-12-26 DIAGNOSIS — I480 Paroxysmal atrial fibrillation: Secondary | ICD-10-CM | POA: Diagnosis present

## 2015-12-26 DIAGNOSIS — R05 Cough: Secondary | ICD-10-CM

## 2015-12-26 DIAGNOSIS — I214 Non-ST elevation (NSTEMI) myocardial infarction: Secondary | ICD-10-CM | POA: Diagnosis present

## 2015-12-26 DIAGNOSIS — A0472 Enterocolitis due to Clostridium difficile, not specified as recurrent: Secondary | ICD-10-CM | POA: Diagnosis present

## 2015-12-26 DIAGNOSIS — R509 Fever, unspecified: Secondary | ICD-10-CM

## 2015-12-26 DIAGNOSIS — E876 Hypokalemia: Secondary | ICD-10-CM | POA: Diagnosis present

## 2015-12-26 DIAGNOSIS — I379 Nonrheumatic pulmonary valve disorder, unspecified: Secondary | ICD-10-CM | POA: Diagnosis not present

## 2015-12-26 HISTORY — DX: Essential (primary) hypertension: I10

## 2015-12-26 HISTORY — DX: Hyperlipidemia, unspecified: E78.5

## 2015-12-26 HISTORY — DX: Personal history of pneumonia (recurrent): Z87.01

## 2015-12-26 LAB — CBC WITH DIFFERENTIAL/PLATELET
BASOS PCT: 0 %
Basophils Absolute: 0.1 10*3/uL (ref 0.0–0.1)
EOS ABS: 0 10*3/uL (ref 0.0–0.7)
EOS PCT: 0 %
HCT: 42.1 % (ref 36.0–46.0)
Hemoglobin: 13.7 g/dL (ref 12.0–15.0)
Lymphocytes Relative: 4 %
Lymphs Abs: 0.8 10*3/uL (ref 0.7–4.0)
MCH: 28.7 pg (ref 26.0–34.0)
MCHC: 32.5 g/dL (ref 30.0–36.0)
MCV: 88.1 fL (ref 78.0–100.0)
Monocytes Absolute: 1.9 10*3/uL — ABNORMAL HIGH (ref 0.1–1.0)
Monocytes Relative: 9 %
NEUTROS PCT: 87 %
Neutro Abs: 17.8 10*3/uL — ABNORMAL HIGH (ref 1.7–7.7)
PLATELETS: 260 10*3/uL (ref 150–400)
RBC: 4.78 MIL/uL (ref 3.87–5.11)
RDW: 13.7 % (ref 11.5–15.5)
WBC: 20.5 10*3/uL — AB (ref 4.0–10.5)

## 2015-12-26 LAB — URINALYSIS, ROUTINE W REFLEX MICROSCOPIC
Glucose, UA: NEGATIVE mg/dL
Ketones, ur: 15 mg/dL — AB
LEUKOCYTES UA: NEGATIVE
NITRITE: NEGATIVE
Protein, ur: 100 mg/dL — AB
SPECIFIC GRAVITY, URINE: 1.025 (ref 1.005–1.030)
pH: 5 (ref 5.0–8.0)

## 2015-12-26 LAB — C DIFFICILE QUICK SCREEN W PCR REFLEX
C DIFFICILE (CDIFF) TOXIN: POSITIVE — AB
C DIFFICLE (CDIFF) ANTIGEN: POSITIVE — AB
C Diff interpretation: POSITIVE

## 2015-12-26 LAB — COMPREHENSIVE METABOLIC PANEL
ALT: 13 U/L — ABNORMAL LOW (ref 14–54)
AST: 30 U/L (ref 15–41)
Albumin: 2.9 g/dL — ABNORMAL LOW (ref 3.5–5.0)
Alkaline Phosphatase: 58 U/L (ref 38–126)
Anion gap: 14 (ref 5–15)
BUN: 20 mg/dL (ref 6–20)
CHLORIDE: 99 mmol/L — AB (ref 101–111)
CO2: 27 mmol/L (ref 22–32)
Calcium: 8.7 mg/dL — ABNORMAL LOW (ref 8.9–10.3)
Creatinine, Ser: 1.22 mg/dL — ABNORMAL HIGH (ref 0.44–1.00)
GFR, EST AFRICAN AMERICAN: 42 mL/min — AB (ref >60)
GFR, EST NON AFRICAN AMERICAN: 36 mL/min — AB (ref >60)
Glucose, Bld: 140 mg/dL — ABNORMAL HIGH (ref 65–99)
POTASSIUM: 2.8 mmol/L — AB (ref 3.5–5.1)
Sodium: 140 mmol/L (ref 135–145)
Total Bilirubin: 0.8 mg/dL (ref 0.3–1.2)
Total Protein: 6.1 g/dL — ABNORMAL LOW (ref 6.5–8.1)

## 2015-12-26 LAB — URINE MICROSCOPIC-ADD ON

## 2015-12-26 LAB — LACTIC ACID, PLASMA: Lactic Acid, Venous: 2.3 mmol/L (ref 0.5–2.0)

## 2015-12-26 LAB — TROPONIN I
TROPONIN I: 3.26 ng/mL — AB (ref <0.031)
Troponin I: 4.21 ng/mL (ref <0.031)
Troponin I: 4.09 ng/mL (ref <0.031)

## 2015-12-26 LAB — APTT

## 2015-12-26 LAB — PROTIME-INR
INR: 1.47 (ref 0.00–1.49)
Prothrombin Time: 17.9 seconds — ABNORMAL HIGH (ref 11.6–15.2)

## 2015-12-26 LAB — I-STAT CG4 LACTIC ACID, ED: LACTIC ACID, VENOUS: 2.17 mmol/L — AB (ref 0.5–2.0)

## 2015-12-26 LAB — PROCALCITONIN: Procalcitonin: 1.21 ng/mL

## 2015-12-26 MED ORDER — ASPIRIN 81 MG PO CHEW: 324.0000 mg | CHEWABLE_TABLET | ORAL | Status: DC

## 2015-12-26 MED ORDER — ACETAMINOPHEN 325 MG PO TABS: 650.0000 mg | ORAL_TABLET | ORAL | Status: DC | PRN

## 2015-12-26 MED ORDER — ASPIRIN 300 MG RE SUPP: 300.0000 mg | RECTAL | Status: DC

## 2015-12-26 MED ORDER — NITROGLYCERIN 0.4 MG SL SUBL: 0.4000 mg | SUBLINGUAL_TABLET | SUBLINGUAL | Status: DC | PRN

## 2015-12-26 MED ORDER — METOPROLOL TARTRATE 1 MG/ML IV SOLN
2.5000 mg | Freq: Once | INTRAVENOUS | Status: DC
  Filled 2015-12-26: qty 5

## 2015-12-26 MED ORDER — ASPIRIN 81 MG PO CHEW
324.0000 mg | CHEWABLE_TABLET | Freq: Once | ORAL | Status: AC
  Administered 2015-12-26: 324 mg via ORAL
  Filled 2015-12-26: qty 4

## 2015-12-26 MED ORDER — HEPARIN BOLUS VIA INFUSION
4000.0000 [IU] | Freq: Once | INTRAVENOUS | Status: AC
  Administered 2015-12-26: 4000 [IU] via INTRAVENOUS
  Filled 2015-12-26: qty 4000

## 2015-12-26 MED ORDER — HEPARIN (PORCINE) IN NACL 100-0.45 UNIT/ML-% IJ SOLN
950.0000 [IU]/h | INTRAMUSCULAR | Status: DC
  Administered 2015-12-26: 850 [IU]/h via INTRAVENOUS
  Filled 2015-12-26 (×2): qty 250

## 2015-12-26 MED ORDER — ACETAMINOPHEN 325 MG PO TABS
650.0000 mg | ORAL_TABLET | Freq: Once | ORAL | Status: AC
  Administered 2015-12-26: 650 mg via ORAL
  Filled 2015-12-26: qty 2

## 2015-12-26 MED ORDER — ONDANSETRON HCL 4 MG/2ML IJ SOLN
4.0000 mg | Freq: Four times a day (QID) | INTRAMUSCULAR | Status: DC | PRN
  Administered 2015-12-26: 4 mg via INTRAVENOUS
  Filled 2015-12-26: qty 2

## 2015-12-26 MED ORDER — VANCOMYCIN 50 MG/ML ORAL SOLUTION
500.0000 mg | Freq: Four times a day (QID) | ORAL | Status: DC
  Administered 2015-12-26 – 2015-12-28 (×4): 500 mg via ORAL
  Filled 2015-12-26 (×16): qty 10

## 2015-12-26 MED ORDER — VANCOMYCIN 50 MG/ML ORAL SOLUTION
ORAL | Status: AC
  Filled 2015-12-26: qty 20

## 2015-12-26 MED ORDER — POTASSIUM CHLORIDE 10 MEQ/100ML IV SOLN
10.0000 meq | INTRAVENOUS | Status: AC
  Administered 2015-12-26 (×3): 10 meq via INTRAVENOUS
  Filled 2015-12-26: qty 100

## 2015-12-26 MED ORDER — SODIUM CHLORIDE 0.9 % IV BOLUS (SEPSIS)
500.0000 mL | Freq: Once | INTRAVENOUS | Status: AC
  Administered 2015-12-26: 500 mL via INTRAVENOUS

## 2015-12-26 MED ORDER — POTASSIUM CHLORIDE 10 MEQ/50ML IV SOLN
10.0000 meq | INTRAVENOUS | Status: DC
  Filled 2015-12-26 (×4): qty 50

## 2015-12-26 MED ORDER — SERTRALINE HCL 50 MG PO TABS
25.0000 mg | ORAL_TABLET | Freq: Every day | ORAL | Status: DC
  Administered 2015-12-26 – 2016-01-01 (×7): 25 mg via ORAL
  Filled 2015-12-26 (×8): qty 1

## 2015-12-26 MED ORDER — SODIUM CHLORIDE 0.9 % IV SOLN
Freq: Once | INTRAVENOUS | Status: AC
  Administered 2015-12-26: 09:00:00 via INTRAVENOUS

## 2015-12-26 MED ORDER — SODIUM CHLORIDE 0.9 % IV BOLUS (SEPSIS)
1000.0000 mL | INTRAVENOUS | Status: AC
  Administered 2015-12-26 (×2): 1000 mL via INTRAVENOUS

## 2015-12-26 MED ORDER — ASPIRIN EC 81 MG PO TBEC
81.0000 mg | DELAYED_RELEASE_TABLET | Freq: Every day | ORAL | Status: DC
  Administered 2015-12-27 – 2016-01-01 (×6): 81 mg via ORAL
  Filled 2015-12-26 (×7): qty 1

## 2015-12-26 MED ORDER — POTASSIUM CHLORIDE 10 MEQ/100ML IV SOLN
10.0000 meq | INTRAVENOUS | Status: AC
  Administered 2015-12-26 (×3): 10 meq via INTRAVENOUS
  Filled 2015-12-26 (×3): qty 100

## 2015-12-26 MED ORDER — SODIUM CHLORIDE 0.9 % IV BOLUS (SEPSIS)
500.0000 mL | INTRAVENOUS | Status: AC
  Administered 2015-12-26: 500 mL via INTRAVENOUS

## 2015-12-26 NOTE — Progress Notes (Signed)
ANTICOAGULATION CONSULT NOTE - Initial Consult  Pharmacy Consult for Heparin Indication: chest pain/ACS  Allergies  Allergen Reactions  . Celebrex [Celecoxib]   . Codeine Other (See Comments)    Goes out of right state of mind.   . Lipitor [Atorvastatin]   . Morphine And Related   . Prozac [Fluoxetine Hcl]   . Vioxx [Rofecoxib]   . Xanax [Alprazolam] Other (See Comments)    Goes out of right state of mind.     Patient Measurements: Height: 5\' 6"  (167.6 cm) Weight: 160 lb (72.576 kg) IBW/kg (Calculated) : 59.3  HEPARIN DW (KG): 72.6  Vital Signs: Temp: 98.3 F (36.8 C) (02/11 1818) Temp Source: Oral (02/11 1818) BP: 117/63 mmHg (02/11 1818) Pulse Rate: 115 (02/11 1818)  Labs:  Recent Labs  12/26/15 0826 12/26/15 1608  HGB 13.7  --   HCT 42.1  --   PLT 260  --   CREATININE 1.22*  --   TROPONINI 3.26* 4.21*    Estimated Creatinine Clearance: 26.9 mL/min (by C-G formula based on Cr of 1.22).   Medical History: Past Medical History  Diagnosis Date  . High cholesterol   . Hypertension   . Degenerative disc disease, lumbar   . Colitis   . C. difficile colitis   . Reflux   . Hiatal hernia     Medications:  See med rec  Assessment: 82 yo female presents with SOB. Recently treated with IV vancomycin and zosyn for PNA.  Patient does not have chest pain but was noted to have elevated troponin 3.2. Cardiology consulted and plan is to start heparin.  Goal of Therapy:  Heparin level 0.3-0.7 units/ml Monitor platelets by anticoagulation protocol: Yes   Plan:  Give 4000 units bolus x 1 Start heparin infusion at 850 units/hr Check anti-Xa level in 8 hours and daily while on heparin Continue to monitor H&H and platelets   Isac Sarna, BS Vena Austria, BCPS Clinical Pharmacist Pager [phone] 12/26/2015,7:11 PM

## 2015-12-26 NOTE — ED Notes (Signed)
CRITICAL VALUE ALERT  Critical value received:  TROPONIN 3.26  Date of notification:  12/26/2015  Time of notification:  0926  Critical value read back:Yes.    Nurse who received alert:  Domenica Reamer RN   MD notified (1st page):  Dr Jeneen Rinks  Time of first page:  0926  MD notified (2nd page):  Time of second page:  Responding MD:  Dr Jeneen Rinks  Time MD responded:  [phone]

## 2015-12-26 NOTE — Progress Notes (Signed)
DR Darrick Meigs updated on 2230  critical Labs

## 2015-12-26 NOTE — ED Notes (Signed)
AC called for Vancomycin.

## 2015-12-26 NOTE — ED Provider Notes (Signed)
CSN: [id number]     Arrival date & time 12/26/15  [id number] History   First MD Initiated Contact with Patient 12/26/15 0745     Chief Complaint  Patient presents with  . Shortness of Breath      HPI  Patient resists evaluation from a voluntary skilled nursing facility.  Noted to have had low saturations on room air for the past 2-3 days. It is unclear if she is supposed to be on oxygen. However 84% saturations on room air. Placed on O2 by EMS. 95% saturations with 02. Tachycardic. Was recently treated with vancomycin and Zosyn for pneumonia. Last dose 5 days ago. Is also having diarrhea over the last several days. Patient is nonverbal at baseline.  Past medical history hypercholesterolemia hypertension previous C. difficile, reflux/hiatal hernia.  Past Medical History  Diagnosis Date  . High cholesterol   . Hypertension   . Degenerative disc disease, lumbar   . Colitis   . C. difficile colitis   . Reflux   . Hiatal hernia    Past Surgical History  Procedure Laterality Date  . Abdominal hysterectomy    . Cholecystectomy    . Appendectomy    . Abdominal surgery    . Fracture surgery    . Eye surgery      cataracts  . Joint replacement    . Colon surgery  2009   Family History  Problem Relation Age of Onset  . Heart disease Sister    Social History  Substance Use Topics  . Smoking status: Never Smoker   . Smokeless tobacco: None  . Alcohol Use: No   OB History    No data available     Review of Systems  Unable to perform ROS: Patient nonverbal      Allergies  Celebrex; Codeine; Lipitor; Morphine and related; Prozac; Vioxx; and Xanax  Home Medications   Prior to Admission medications   Medication Sig Start Date End Date Taking? Authorizing Provider  acetaminophen (TYLENOL) 500 MG tablet Take 1,000 mg by mouth daily. Pain    Historical Provider, MD  budesonide (ENTOCORT EC) 3 MG 24 hr capsule One capsule daily 03/18/15   Mikey Kirschner, MD  lovastatin  (MEVACOR) 20 MG tablet Take 1 tablet (20 mg total) by mouth at bedtime. 03/18/15   Mikey Kirschner, MD  metoprolol tartrate (LOPRESSOR) 25 MG tablet Take 1 tablet (25 mg total) by mouth 2 (two) times daily. 03/18/15   Mikey Kirschner, MD  Multiple Vitamin (MULTIVITAMIN) tablet Take 1 tablet by mouth daily.    Historical Provider, MD  senna (SENOKOT) 8.6 MG TABS Take 2 tablets by mouth daily.    Historical Provider, MD  triamterene-hydrochlorothiazide (DYAZIDE) 37.5-25 MG per capsule Take 1 each (1 capsule total) by mouth daily. 03/18/15   Mikey Kirschner, MD   BP 100/56 mmHg  Pulse 1  Temp(Src) 102.9 F (39.4 C) (Rectal)  Resp 26  Ht 5\' 6"  (1.676 m)  Wt 160 lb (72.576 kg)  BMI 25.84 kg/m2  SpO2 94% Physical Exam  Constitutional: She appears well-developed and well-nourished. No distress.  HENT:  Head: Normocephalic.  Eyes: Conjunctivae are normal. Pupils are equal, round, and reactive to light. No scleral icterus.  Neck: Normal range of motion. Neck supple. No thyromegaly present.  Cardiovascular: Regular rhythm.  Tachycardia present.  Exam reveals no gallop and no friction rub.   No murmur heard. Sinus tachycardia. Rate 120.  Pulmonary/Chest: Effort normal and breath sounds normal. No respiratory  distress. She has no wheezes. She has no rales.    Abdominal: Soft. Bowel sounds are normal. She exhibits no distension. There is no tenderness. There is no rebound.  Depends garmet with marked incontinent stool, loose watery.  Musculoskeletal: Normal range of motion.  Neurological: She is alert.  Skin: Skin is warm and dry. No rash noted.  Psychiatric: She has a normal mood and affect. Her behavior is normal.    ED Course  Procedures (including critical care time) Labs Review Labs Reviewed  C DIFFICILE QUICK SCREEN W PCR REFLEX - Abnormal; Notable for the following:    C Diff antigen POSITIVE (*)    C Diff toxin POSITIVE (*)    All other components within normal limits  CBC WITH  DIFFERENTIAL/PLATELET - Abnormal; Notable for the following:    WBC 20.5 (*)    Neutro Abs 17.8 (*)    Monocytes Absolute 1.9 (*)    All other components within normal limits  COMPREHENSIVE METABOLIC PANEL - Abnormal; Notable for the following:    Potassium 2.8 (*)    Chloride 99 (*)    Glucose, Bld 140 (*)    Creatinine, Ser 1.22 (*)    Calcium 8.7 (*)    Total Protein 6.1 (*)    Albumin 2.9 (*)    ALT 13 (*)    GFR calc non Af Amer 36 (*)    GFR calc Af Amer 42 (*)    All other components within normal limits  URINALYSIS, ROUTINE W REFLEX MICROSCOPIC (NOT AT [facility]) - Abnormal; Notable for the following:    APPearance CLOUDY (*)    Hgb urine dipstick TRACE (*)    Bilirubin Urine SMALL (*)    Ketones, ur 15 (*)    Protein, ur 100 (*)    All other components within normal limits  TROPONIN I - Abnormal; Notable for the following:    Troponin I 3.26 (*)    All other components within normal limits  URINE MICROSCOPIC-ADD ON - Abnormal; Notable for the following:    Squamous Epithelial / LPF 0-5 (*)    Bacteria, UA MANY (*)    All other components within normal limits  I-STAT CG4 LACTIC ACID, ED - Abnormal; Notable for the following:    Lactic Acid, Venous 2.17 (*)    All other components within normal limits  CULTURE, BLOOD (ROUTINE X 2)  CULTURE, BLOOD (ROUTINE X 2)    Imaging Review Dg Chest 2 View  12/26/2015  CLINICAL DATA:  Shortness of breath. EXAM: CHEST  2 VIEW COMPARISON:  Earlier today FINDINGS: Right upper extremity PICC with tip near the SVC origin. Cardiomegaly which is likely accentuated by rotation. Calcified mediastinal lymph nodes. Low lung volumes with bilateral atelectasis and small left effusion. No definite focal consolidation. Limited lateral view due to positioning. IMPRESSION: Low volumes with bilateral atelectasis and small left effusion. No consolidating pneumonia is suspected, but consider follow-up. Electronically Signed   By: Monte Fantasia M.D.   On:  12/26/2015 09:35   Dg Chest Port 1 View  12/26/2015  CLINICAL DATA:  Hypoxemia.  Recent history of pneumonia. EXAM: PORTABLE CHEST 1 VIEW COMPARISON:  09/16/2015; 07/30/2012 FINDINGS: Grossly unchanged enlarged cardiac silhouette and mediastinal contours given persistently reduced lung volumes and patient rotation. Atherosclerotic plaque within the abdominal aorta. Partially calcified right hilar lymph nodes, unchanged. Interval placement of right upper extremity approach PICC line with tip projecting over the central aspect of the right innominate vein. Lung volumes remain  reduced with grossly unchanged bibasilar heterogeneous opacities. No pleural effusion. No evidence of edema. No acute osseous abnormalities. IMPRESSION: 1. Persistently reduced lung volumes with associated bibasilar opacities, atelectasis versus infiltrate. No new focal airspace opacities. Further evaluation with a PA and lateral chest radiograph may be obtained as clinically indicated. 2. Interval placement of right upper extremity approach PICC line with tip projected over the central aspect of the right innominate vein. Electronically Signed   By: Sandi Mariscal M.D.   On: 12/26/2015 08:31   I have personally reviewed and evaluated these images and lab results as part of my medical decision-making.   EKG Interpretation   Date/Time:  Saturday December 26 2015 08:05:43 EST Ventricular Rate:  125 PR Interval:  122 QRS Duration: 118 QT Interval:  339 QTC Calculation: 489 R Axis:   -55 Text Interpretation:  Sinus tachycardia Incomplete left bundle branch  block Low voltage, precordial leads Confirmed by Jeneen Rinks  MD, [place] ([id number])  on 12/26/2015 9:36:43 AM      MDM   Final diagnoses:  NSTEMI (non-ST elevated myocardial infarction) ([place])  Fever, unspecified fever cause  C. difficile colitis  Hypoxemia    Patient with left bundle-branch block on EKG. This is not new with comparison of 2011. Temperature improved 100.1 on  recheck oral. Heart rate is improved from 127-109. C. difficile is positive. Diminished lung volumes and possible pneumonia although not confirmed by chest x-ray. Urine does not appear infected. Hypokalemia., We will supplement. Troponin 3.26.  Clinically patient appears comfortable. She is now speaking. Her grandson Mariea Clonts is at the bedside. He confirms that the patient is a full code. He states that his father in a local make her power of attorney decisions for her though. She does not show any deterioration. Is not hypotensive or showing signs of septic shock. Minimal elevation of lactate at 2.1. We'll speak with hospitalist regarding admission.  CRITICAL CARE Performed by: Tanna Furry JOSEPH   Total critical care time: 45 minutes  Critical care time was exclusive of separately billable procedures and treating other patients.  Critical care was necessary to treat or prevent imminent or life-threatening deterioration.  Critical care was time spent personally by me on the following activities: development of treatment plan with patient and/or surrogate as well as nursing, discussions with consultants, evaluation of patient's response to treatment, examination of patient, obtaining history from patient or surrogate, ordering and performing treatments and interventions, ordering and review of laboratory studies, ordering and review of radiographic studies, pulse oximetry and re-evaluation of patient's condition.       Tanna Furry, MD 12/26/15 1002

## 2015-12-26 NOTE — H&P (Signed)
[facility] History and Physical  Elizabeth Oneal [id number] DOB: Jan 25, 1916 DOA: 12/26/2015  Referring physician: Tanna Furry, MD PCP: Vernell Morgans, MD   Chief Complaint: SOB  HPI: Elizabeth Oneal is a 82 y.o. female with a hx of HTN, C. Diff, and HLD that presents from a skilled nursing facility for complaints of SOB.  Per patient's son, she has been treated with IV  Vancomycin and Zosyn for the last few weeks for PNA. She has also required supplemental O2 for the last several weeks. This morning her son was called by nursing home staff when she was noticed to be increasingly hypoxic and tachycardic. On EMS arrival, O2 sat was 84% on RA. Son reports that she has had a chronic cough which has intermittently been productive. Patient reports multiple bowel movements but is unable to quantify how many.  She denies any chest pain. There is no reported nausea or vomiting. She does have abdominal pain. On arrival to ED, she was found to be febrile with a temp of 102.9. CXR did not show any clear infiltrate and c.diff was found to be positive. EKG did not show acute findings but troponin was noted to be elevated at 3.2. She has been referred for admission.   Review of Systems:  Limited secondary to patient's baseline mental status.  Positive for diarrhea, abdominal pain, and SOB.   Past Medical History  Diagnosis Date  . High cholesterol   . Hypertension   . Degenerative disc disease, lumbar   . Colitis   . C. difficile colitis   . Reflux   . Hiatal hernia    Past Surgical History  Procedure Laterality Date  . Abdominal hysterectomy    . Cholecystectomy    . Appendectomy    . Abdominal surgery    . Fracture surgery    . Eye surgery      cataracts  . Joint replacement    . Colon surgery  2009   Social History:  reports that she has never smoked. She does not have any smokeless tobacco history on file. She reports that she does not drink alcohol or use illicit drugs.  Allergies    Allergen Reactions  . Celebrex [Celecoxib]   . Codeine Other (See Comments)    Goes out of right state of mind.   . Lipitor [Atorvastatin]   . Morphine And Related   . Prozac [Fluoxetine Hcl]   . Vioxx [Rofecoxib]   . Xanax [Alprazolam] Other (See Comments)    Goes out of right state of mind.     Family History  Problem Relation Age of Onset  . Heart disease Sister     Prior to Admission medications   Medication Sig Start Date End Date Taking? Authorizing Provider  acetaminophen (TYLENOL) 500 MG tablet Take 1,000 mg by mouth daily. Pain    Historical Provider, MD  budesonide (ENTOCORT EC) 3 MG 24 hr capsule One capsule daily 03/18/15   Mikey Kirschner, MD  lovastatin (MEVACOR) 20 MG tablet Take 1 tablet (20 mg total) by mouth at bedtime. 03/18/15   Mikey Kirschner, MD  metoprolol tartrate (LOPRESSOR) 25 MG tablet Take 1 tablet (25 mg total) by mouth 2 (two) times daily. 03/18/15   Mikey Kirschner, MD  Multiple Vitamin (MULTIVITAMIN) tablet Take 1 tablet by mouth daily.    Historical Provider, MD  senna (SENOKOT) 8.6 MG TABS Take 2 tablets by mouth daily.    Historical Provider, MD  triamterene-hydrochlorothiazide (DYAZIDE) 37.5-25 MG  per capsule Take 1 each (1 capsule total) by mouth daily. 03/18/15   Mikey Kirschner, MD   Physical Exam: Filed Vitals:   12/26/15 0825 12/26/15 1033 12/26/15 1100 12/26/15 1130  BP: 100/56 100/54 114/60 99/58  Pulse: 1 104 104 105  Temp: 102.9 F (39.4 C)     TempSrc: Rectal     Resp: 26 18 30 22   Height:      Weight:      SpO2: 94% 96% 98% 97%    Wt Readings from Last 3 Encounters:  12/26/15 72.576 kg (160 lb)  03/18/15 72.576 kg (160 lb)  12/30/13 72.576 kg (160 lb)    General:  Appears calm and comfortable Eyes: PERRL, normal lids, irises & conjunctiva ENT: grossly normal hearing, lips & tongue Neck: no LAD, masses or thyromegaly Cardiovascular: RRR, no m/r/g. No LE edema. Telemetry: SR, no arrhythmias  Respiratory: Bilateral  rhonchi Abdomen: soft, some tenderness in the periumbilical area Skin: no rash or induration seen on limited exam Musculoskeletal: grossly normal tone BUE/BLE Psychiatric: cooperative with exam. Limited due to mental status.  Neurologic: grossly non-focal.          Labs on Admission:  Basic Metabolic Panel:  Recent Labs Lab 12/26/15 0826  NA 140  K 2.8*  CL 99*  CO2 27  GLUCOSE 140*  BUN 20  CREATININE 1.22*  CALCIUM 8.7*   Liver Function Tests:  Recent Labs Lab 12/26/15 0826  AST 30  ALT 13*  ALKPHOS 58  BILITOT 0.8  PROT 6.1*  ALBUMIN 2.9*   CBC:  Recent Labs Lab 12/26/15 0826  WBC 20.5*  NEUTROABS 17.8*  HGB 13.7  HCT 42.1  MCV 88.1  PLT 260   Cardiac Enzymes:  Recent Labs Lab 12/26/15 0826  TROPONINI 3.26*   Radiological Exams on Admission: Dg Chest 2 View  12/26/2015  CLINICAL DATA:  Shortness of breath. EXAM: CHEST  2 VIEW COMPARISON:  Earlier today FINDINGS: Right upper extremity PICC with tip near the SVC origin. Cardiomegaly which is likely accentuated by rotation. Calcified mediastinal lymph nodes. Low lung volumes with bilateral atelectasis and small left effusion. No definite focal consolidation. Limited lateral view due to positioning. IMPRESSION: Low volumes with bilateral atelectasis and small left effusion. No consolidating pneumonia is suspected, but consider follow-up. Electronically Signed   By: Monte Fantasia M.D.   On: 12/26/2015 09:35   Dg Chest Port 1 View  12/26/2015  CLINICAL DATA:  Hypoxemia.  Recent history of pneumonia. EXAM: PORTABLE CHEST 1 VIEW COMPARISON:  09/16/2015; 07/30/2012 FINDINGS: Grossly unchanged enlarged cardiac silhouette and mediastinal contours given persistently reduced lung volumes and patient rotation. Atherosclerotic plaque within the abdominal aorta. Partially calcified right hilar lymph nodes, unchanged. Interval placement of right upper extremity approach PICC line with tip projecting over the central  aspect of the right innominate vein. Lung volumes remain reduced with grossly unchanged bibasilar heterogeneous opacities. No pleural effusion. No evidence of edema. No acute osseous abnormalities. IMPRESSION: 1. Persistently reduced lung volumes with associated bibasilar opacities, atelectasis versus infiltrate. No new focal airspace opacities. Further evaluation with a PA and lateral chest radiograph may be obtained as clinically indicated. 2. Interval placement of right upper extremity approach PICC line with tip projected over the central aspect of the right innominate vein. Electronically Signed   By: Sandi Mariscal M.D.   On: 12/26/2015 08:31    EKG: Independently reviewed. LBBB, chronic.  Assessment/Plan Active Problems:   Hyperlipidemia LDL goal <130   C.  difficile colitis   Sepsis ([place])   NSTEMI (non-ST elevated myocardial infarction) ([place])   Hypokalemia   CKD (chronic kidney disease) stage 3, GFR 30-59 ml/min   Acute respiratory failure with hypoxia ([place])   1. C.diff. Patient recently treated for PNA with Vancomycin and Zosyn. Will start on oral Vancomycin and IVF.  2. Sepsis, likely secondary to c.diff. WBC 20.5, Lactic acid 2.17, febrile to 102.9. CXR does not show obvious infiltrate and UA negative for infection. Will start on IVF per sepsis protocol. 3. Acute hypoxic respiratory failure. Per EMS, 02 sat was 84% on their arrival. Patient was recently treated for PNA with abx and supplemental O2. Today she was noted to be increasingly hypoxic but was not wearing her oxygen. We will continue supplemental O2 and wean as tolerated. 4. Hypotension, likely related to hypovolemia/sepsis. Will continue IVF per sepsis protocol and monitor.  5. Hypokalemia, will replete.  6. NSTEMI, likely demand ischemia secondary to sepsis. Discussed with to Dr. Sallyanne Kuster, on call cardiology, who agreed with treating supportively at this time. There does not appear to be a contraindication for Heparin so we  will anticoagulate her with Heparin and start ASA. Will check ECHO. If troponin start trending down and ECHO does not show any wall motion abnormalities, will d/c Heparin. Patient denies any CP and EKG does not appear acute.  7. CKD stage III. Creatinine is near baseline, continue to monitor.  8. HLD, continue statin    Code Status: DNR DVT Prophylaxis: Heparin  Family Communication: Son at bedside.  Disposition Plan: Admit to ICU, anticipate stay 3-4 days.    Time spent: 55 minutes  [employer]. MD [facility] Pager [phone]   By signing my name below, I, Rosalie Doctor, attest that this documentation has been prepared under the direction and in the presence of [facility]. MD Electronically Signed: Rosalie Doctor, Scribe. 12/26/2015  I, Dr. Kathie Dike, personally performed the services described in this documentaiton. All medical record entries made by the scribe were at my direction and in my presence. I have reviewed the chart and agree that the record reflects my personal performance and is accurate and complete  Kathie Dike, MD, 12/26/2015 11:48 AM

## 2015-12-26 NOTE — ED Notes (Signed)
CRITICAL VALUE ALERT  Critical value received:  C-diff positive for antigens and toxins  Date of notification:  12/26/2015  Time of notification:  0938  Critical value read back:Yes.    Nurse who received alert:  Domenica Reamer RN   MD notified (1st page):  Dr Jeneen Rinks  Time of first page:  [phone]  MD notified (2nd page):  Time of second page:  Responding MD:  Dr Jeneen Rinks  Time MD responded:  [phone]

## 2015-12-26 NOTE — ED Notes (Signed)
Pt sent from [place] for evaluation of low o2 sats.  Pt was recently treated with IV Vancomycin and Zosyn for pneumonia.  Pt was not on oxygen when EMS arrived, but has an order to change tubing weekly.

## 2015-12-27 ENCOUNTER — Inpatient Hospital Stay (HOSPITAL_COMMUNITY): Payer: Medicare Other

## 2015-12-27 DIAGNOSIS — I379 Nonrheumatic pulmonary valve disorder, unspecified: Secondary | ICD-10-CM

## 2015-12-27 LAB — BASIC METABOLIC PANEL
ANION GAP: 8 (ref 5–15)
BUN: 23 mg/dL — AB (ref 6–20)
CALCIUM: 8 mg/dL — AB (ref 8.9–10.3)
CO2: 25 mmol/L (ref 22–32)
Chloride: 104 mmol/L (ref 101–111)
Creatinine, Ser: 1.02 mg/dL — ABNORMAL HIGH (ref 0.44–1.00)
GFR calc Af Amer: 51 mL/min — ABNORMAL LOW (ref >60)
GFR calc non Af Amer: 44 mL/min — ABNORMAL LOW (ref >60)
GLUCOSE: 99 mg/dL (ref 65–99)
POTASSIUM: 3.4 mmol/L — AB (ref 3.5–5.1)
Sodium: 137 mmol/L (ref 135–145)

## 2015-12-27 LAB — CBC
HCT: 35.8 % — ABNORMAL LOW (ref 36.0–46.0)
Hemoglobin: 11.6 g/dL — ABNORMAL LOW (ref 12.0–15.0)
MCH: 29.1 pg (ref 26.0–34.0)
MCHC: 32.4 g/dL (ref 30.0–36.0)
MCV: 89.9 fL (ref 78.0–100.0)
PLATELETS: 207 10*3/uL (ref 150–400)
RBC: 3.98 MIL/uL (ref 3.87–5.11)
RDW: 14.2 % (ref 11.5–15.5)
WBC: 13.9 10*3/uL — ABNORMAL HIGH (ref 4.0–10.5)

## 2015-12-27 LAB — MRSA PCR SCREENING: MRSA by PCR: NEGATIVE

## 2015-12-27 LAB — HEPARIN LEVEL (UNFRACTIONATED): Heparin Unfractionated: 0.61 IU/mL (ref 0.30–0.70)

## 2015-12-27 LAB — TROPONIN I: Troponin I: 4.04 ng/mL (ref <0.031)

## 2015-12-27 MED ORDER — VANCOMYCIN 50 MG/ML ORAL SOLUTION
ORAL | Status: AC
  Filled 2015-12-27: qty 10

## 2015-12-27 MED ORDER — SODIUM CHLORIDE 0.9 % IV SOLN
1250.0000 mg | Freq: Once | INTRAVENOUS | Status: AC
  Administered 2015-12-27: 1250 mg via INTRAVENOUS
  Filled 2015-12-27: qty 1250

## 2015-12-27 MED ORDER — VANCOMYCIN HCL IN DEXTROSE 1-5 GM/200ML-% IV SOLN
1000.0000 mg | INTRAVENOUS | Status: DC
Start: 2015-12-28 — End: 2015-12-28

## 2015-12-27 NOTE — Progress Notes (Signed)
[facility] PROGRESS NOTE  Elizabeth Oneal [id number] DOB: 1916/06/16 DOA: 12/26/2015 PCP: Vernell Morgans, MD  Assessment/Plan: 1. C.diff colitis. Patient recently treated for PNA with Vancomycin and Zosyn. Will continue oral Vancomycin and IVF. Discussed with GI who recommended a 6 week taper of Vancomycin.  2. Sepsis, likely secondary to c.diff. WBC improved at 13.9, Lactic acid 2.3, currently afebrile. CXR does not show obvious infiltrate and UA negative for infection. Will continue IVF. BC have shown no growth to date.  3. Acute hypoxic respiratory failure. We will continue supplemental O2 and wean as tolerated. 4. Hypotension, likely related to hypovolemia/sepsis. Improving with IVF.  5. Hypokalemia, will replete.   6. NSTEMI, likely demand ischemia secondary to sepsis. Discussed with to Dr. Sallyanne Kuster, on call cardiology, who agreed with treating supportively at this time. Continue ASA and anticoagulation with Heparin. ECHO has been ordered. Troponin is trending down.  If ECHO does not show any wall motion abnormalities, will d/c Heparin. Patient denies any CP and EKG does not appear acute.  Will consult cardiology. 7. CKD stage III. Creatinine is near baseline, continue to monitor.  8. HLD, continue statin   Code Status: DNR DVT Prophylaxis: Heparin  Family Communication: No family at bedside  Disposition Plan: Continue to monitor in ICU.    Consultants:    Procedures:    Antibiotics:  Oral Vancocin 2/11>>  HPI/Subjective: Doing well. Breathing is okay. Denies abdominal pain or CP. Is not having much diarrhea.   Objective: Filed Vitals:   12/27/15 0500 12/27/15 0600  BP: 103/55 124/58  Pulse: 122 114  Temp:    Resp: 32 28    Intake/Output Summary (Last 24 hours) at 12/27/15 0734 Last data filed at 12/27/15 0700  Gross per 24 hour  Intake 389.11 ml  Output      0 ml  Net 389.11 ml   Filed Weights   12/26/15 0746  Weight: 72.576 kg (160 lb)     Exam:  General: NAD, looks comfortable Cardiovascular: Tachycardic Respiratory: Crackles at the bases Abdomen: soft, non tender, no distention , bowel sounds normal Musculoskeletal: No edema b/l  Data Reviewed: Basic Metabolic Panel:  Recent Labs Lab 12/26/15 0826  NA 140  K 2.8*  CL 99*  CO2 27  GLUCOSE 140*  BUN 20  CREATININE 1.22*  CALCIUM 8.7*   Liver Function Tests:  Recent Labs Lab 12/26/15 0826  AST 30  ALT 13*  ALKPHOS 58  BILITOT 0.8  PROT 6.1*  ALBUMIN 2.9*    CBC:  Recent Labs Lab 12/26/15 0826 12/27/15 0508  WBC 20.5* 13.9*  NEUTROABS 17.8*  --   HGB 13.7 11.6*  HCT 42.1 35.8*  MCV 88.1 89.9  PLT 260 207   Cardiac Enzymes:  Recent Labs Lab 12/26/15 0826 12/26/15 1608 12/26/15 2131 12/27/15 0213  TROPONINI 3.26* 4.21* 4.09* 4.04*     Recent Results (from the past 240 hour(s))  C difficile quick scan w PCR reflex     Status: Abnormal   Collection Time: 12/26/15  8:17 AM  Result Value Ref Range Status   C Diff antigen POSITIVE (A) NEGATIVE Final   C Diff toxin POSITIVE (A) NEGATIVE Final   C Diff interpretation Positive for toxigenic C. difficile  Final    Comment: CRITICAL RESULT CALLED TO, READ BACK BY AND VERIFIED WITH: BLACKBURN C. AT 0940A ON [id number] BY THOMPSON S.   Culture, blood (Routine X 2) w Reflex to ID Panel     Status: None (Preliminary result)  Collection Time: 12/26/15  8:43 AM  Result Value Ref Range Status   Specimen Description BLOOD RIGHT HAND  Final   Special Requests BOTTLES DRAWN AEROBIC AND ANAEROBIC 6CC  Final   Culture PENDING  Incomplete   Report Status PENDING  Incomplete  Culture, blood (Routine X 2) w Reflex to ID Panel     Status: None (Preliminary result)   Collection Time: 12/26/15  9:32 AM  Result Value Ref Range Status   Specimen Description BLOOD LEFT ARM  Final   Special Requests BOTTLES DRAWN AEROBIC ONLY 6CC  Final   Culture PENDING  Incomplete   Report Status PENDING  Incomplete   MRSA PCR Screening     Status: None   Collection Time: 12/26/15  6:45 PM  Result Value Ref Range Status   MRSA by PCR NEGATIVE NEGATIVE Final    Comment:        The GeneXpert MRSA Assay (FDA approved for NASAL specimens only), is one component of a comprehensive MRSA colonization surveillance program. It is not intended to diagnose MRSA infection nor to guide or monitor treatment for MRSA infections.   Culture, blood (x 2)     Status: None (Preliminary result)   Collection Time: 12/26/15  9:31 PM  Result Value Ref Range Status   Specimen Description LEFT ANTECUBITAL  Final   Special Requests BOTTLES DRAWN AEROBIC ONLY 6CC ONLY  Final   Culture PENDING  Incomplete   Report Status PENDING  Incomplete  Culture, blood (x 2)     Status: None (Preliminary result)   Collection Time: 12/26/15  9:41 PM  Result Value Ref Range Status   Specimen Description BLOOD LEFT ARM  Final   Special Requests BOTTLES DRAWN AEROBIC AND ANAEROBIC [place]  Final   Culture PENDING  Incomplete   Report Status PENDING  Incomplete     Studies: Dg Chest 2 View  12/26/2015  CLINICAL DATA:  Shortness of breath. EXAM: CHEST  2 VIEW COMPARISON:  Earlier today FINDINGS: Right upper extremity PICC with tip near the SVC origin. Cardiomegaly which is likely accentuated by rotation. Calcified mediastinal lymph nodes. Low lung volumes with bilateral atelectasis and small left effusion. No definite focal consolidation. Limited lateral view due to positioning. IMPRESSION: Low volumes with bilateral atelectasis and small left effusion. No consolidating pneumonia is suspected, but consider follow-up. Electronically Signed   By: Monte Fantasia M.D.   On: 12/26/2015 09:35   Dg Chest Port 1 View  12/26/2015  CLINICAL DATA:  Hypoxemia.  Recent history of pneumonia. EXAM: PORTABLE CHEST 1 VIEW COMPARISON:  09/16/2015; 07/30/2012 FINDINGS: Grossly unchanged enlarged cardiac silhouette and mediastinal contours given  persistently reduced lung volumes and patient rotation. Atherosclerotic plaque within the abdominal aorta. Partially calcified right hilar lymph nodes, unchanged. Interval placement of right upper extremity approach PICC line with tip projecting over the central aspect of the right innominate vein. Lung volumes remain reduced with grossly unchanged bibasilar heterogeneous opacities. No pleural effusion. No evidence of edema. No acute osseous abnormalities. IMPRESSION: 1. Persistently reduced lung volumes with associated bibasilar opacities, atelectasis versus infiltrate. No new focal airspace opacities. Further evaluation with a PA and lateral chest radiograph may be obtained as clinically indicated. 2. Interval placement of right upper extremity approach PICC line with tip projected over the central aspect of the right innominate vein. Electronically Signed   By: Sandi Mariscal M.D.   On: 12/26/2015 08:31    Scheduled Meds: . aspirin EC  81 mg Oral Daily  .  sertraline  25 mg Oral Daily  . vancomycin  500 mg Oral 4 times per day   Continuous Infusions: . heparin 850 Units/hr (12/27/15 0700)    Active Problems:   Hyperlipidemia LDL goal <130   C. difficile colitis   Sepsis ([place])   NSTEMI (non-ST elevated myocardial infarction) ([place])   Hypokalemia   CKD (chronic kidney disease) stage 3, GFR 30-59 ml/min   Acute respiratory failure with hypoxia ([place])    Time spent: 25 minutes   Chalsea Darko. MD  [facility] Pager [phone]. If 7PM-7AM, please contact night-coverage at [email or web address], password [facility] 12/27/2015, 7:34 AM  LOS: 1 day     By signing my name below, I, Rosalie Doctor, attest that this documentation has been prepared under the direction and in the presence of [facility]. MD Electronically Signed: Rosalie Doctor, Scribe. 12/27/2015 9:27am  I, Dr. Kathie Dike, personally performed the services described in this documentaiton. All medical record entries made by  the scribe were at my direction and in my presence. I have reviewed the chart and agree that the record reflects my personal performance and is accurate and complete  Kathie Dike, MD, 12/27/2015 9:42 AM

## 2015-12-27 NOTE — Progress Notes (Signed)
Pharmacy Antibiotic Note  Elizabeth Oneal is a 82 y.o. female admitted on 12/26/2015 with sepsis and SOB.  Pharmacy has been consulted for Vancomycin dosing. Admitted with Sepsis. Has SOB, elevated troponins, has c diff colitis(recently tx for PNA with Vancomycin and Zosyn.). Elevated WBC 20.5 now 13.9, tmax 102.9.   Plan: Vancomycin loading dose 1250mg  Vancomycin 1000 IV every 24 hours.  Goal trough 15-20 mcg/mL.  F/U blood culture results Monitor V/S and labs  Height: 5\' 6"  (167.6 cm) Weight: 160 lb (72.576 kg) IBW/kg (Calculated) : 59.3  Temp (24hrs), Avg:98.8 F (37.1 C), Min:97.5 F (36.4 C), Max:99.9 F (37.7 C)   Recent Labs Lab 12/26/15 0826 12/26/15 0902 12/26/15 2140 12/27/15 0508 12/27/15 0954  WBC 20.5*  --   --  13.9*  --   CREATININE 1.22*  --   --   --  1.02*  LATICACIDVEN  --  2.17* 2.3*  --   --     Estimated Creatinine Clearance: 31.4 mL/min (by C-G formula based on Cr of 1.02).    Allergies  Allergen Reactions  . Celebrex [Celecoxib]   . Codeine Other (See Comments)    Goes out of right state of mind.   . Lipitor [Atorvastatin]   . Morphine And Related   . Prozac [Fluoxetine Hcl]   . Vioxx [Rofecoxib]   . Xanax [Alprazolam] Other (See Comments)    Goes out of right state of mind.     Antimicrobials this admission: Vancomycin PO 2/11>>  Vancomycin IV 2/12 >>   Dose adjustments this admission: None thus far  Microbiology results: 2/11 BCx: 1 bottle + for [facility] 2/11 MRSA PCR: Positive  Thank you for allowing pharmacy to be a part of this patient's care.  Trenton Gammon, Latoia Eyster L 12/27/2015 1:32 PM

## 2015-12-27 NOTE — Progress Notes (Signed)
ANTICOAGULATION CONSULT NOTE - Initial Consult  Pharmacy Consult for Heparin Indication: chest pain/ACS  Allergies  Allergen Reactions  . Celebrex [Celecoxib]   . Codeine Other (See Comments)    Goes out of right state of mind.   . Lipitor [Atorvastatin]   . Morphine And Related   . Prozac [Fluoxetine Hcl]   . Vioxx [Rofecoxib]   . Xanax [Alprazolam] Other (See Comments)    Goes out of right state of mind.     Patient Measurements: Height: 5\' 6"  (167.6 cm) Weight: 160 lb (72.576 kg) IBW/kg (Calculated) : 59.3  HEPARIN DW (KG): 72.6  Vital Signs: Temp: 99 F (37.2 C) (02/12 0747) Temp Source: Axillary (02/12 0747) BP: 118/60 mmHg (02/12 0800) Pulse Rate: 112 (02/12 0800)  Labs:  Recent Labs  12/26/15 0826 12/26/15 1608 12/26/15 2131 12/27/15 0213 12/27/15 0508  HGB 13.7  --   --   --  11.6*  HCT 42.1  --   --   --  35.8*  PLT 260  --   --   --  207  APTT  --   --  >200*  --   --   LABPROT  --   --  17.9*  --   --   INR  --   --  1.47  --   --   HEPARINUNFRC  --   --   --   --  0.61  CREATININE 1.22*  --   --   --   --   TROPONINI 3.26* 4.21* 4.09* 4.04*  --     Estimated Creatinine Clearance: 26.3 mL/min (by C-G formula based on Cr of 1.22).   Medical History: Past Medical History  Diagnosis Date  . High cholesterol   . Hypertension   . Degenerative disc disease, lumbar   . Colitis   . C. difficile colitis   . Reflux   . Hiatal hernia     Medications:  See med rec  Assessment: 82 yo female presents with SOB. Recently treated with IV vancomycin and zosyn for PNA.  Patient does not have chest pain but was noted to have elevated troponin 3.2. Troponin up to 4.21 now trending down.Cardiology consulted and plan is to start heparin.  Therapeutic HL 0.61 this AM. Awaiting ECHO to determine length of heparin treatment.  Goal of Therapy:  Heparin level 0.3-0.7 units/ml Monitor platelets by anticoagulation protocol: Yes   Plan:  Continue heparin  infusion at 850 units/hr Check anti-Xa level daily while on heparin Continue to monitor H&H and platelets  Isac Sarna, BS [place], BCPS Clinical Pharmacist Pager [phone] 12/27/2015,9:45 AM

## 2015-12-27 NOTE — Progress Notes (Signed)
Echocardiogram 2D Echocardiogram has been performed.  Aggie Cosier 12/27/2015, 12:31 PM

## 2015-12-28 ENCOUNTER — Encounter (HOSPITAL_COMMUNITY): Payer: Self-pay | Admitting: Cardiology

## 2015-12-28 DIAGNOSIS — A419 Sepsis, unspecified organism: Secondary | ICD-10-CM

## 2015-12-28 DIAGNOSIS — I214 Non-ST elevation (NSTEMI) myocardial infarction: Secondary | ICD-10-CM

## 2015-12-28 DIAGNOSIS — I480 Paroxysmal atrial fibrillation: Secondary | ICD-10-CM

## 2015-12-28 DIAGNOSIS — A047 Enterocolitis due to Clostridium difficile: Secondary | ICD-10-CM

## 2015-12-28 DIAGNOSIS — I255 Ischemic cardiomyopathy: Secondary | ICD-10-CM

## 2015-12-28 DIAGNOSIS — J9601 Acute respiratory failure with hypoxia: Secondary | ICD-10-CM

## 2015-12-28 DIAGNOSIS — E876 Hypokalemia: Secondary | ICD-10-CM

## 2015-12-28 LAB — BASIC METABOLIC PANEL
Anion gap: 10 (ref 5–15)
BUN: 24 mg/dL — ABNORMAL HIGH (ref 6–20)
CALCIUM: 8 mg/dL — AB (ref 8.9–10.3)
CO2: 22 mmol/L (ref 22–32)
CREATININE: 0.98 mg/dL (ref 0.44–1.00)
Chloride: 106 mmol/L (ref 101–111)
GFR calc Af Amer: 54 mL/min — ABNORMAL LOW (ref >60)
GFR calc non Af Amer: 46 mL/min — ABNORMAL LOW (ref >60)
GLUCOSE: 130 mg/dL — AB (ref 65–99)
Potassium: 3.4 mmol/L — ABNORMAL LOW (ref 3.5–5.1)
Sodium: 138 mmol/L (ref 135–145)

## 2015-12-28 LAB — CBC
HCT: 33.7 % — ABNORMAL LOW (ref 36.0–46.0)
Hemoglobin: 10.7 g/dL — ABNORMAL LOW (ref 12.0–15.0)
MCH: 28.7 pg (ref 26.0–34.0)
MCHC: 31.8 g/dL (ref 30.0–36.0)
MCV: 90.3 fL (ref 78.0–100.0)
PLATELETS: 207 10*3/uL (ref 150–400)
RBC: 3.73 MIL/uL — ABNORMAL LOW (ref 3.87–5.11)
RDW: 13.8 % (ref 11.5–15.5)
WBC: 11.7 10*3/uL — AB (ref 4.0–10.5)

## 2015-12-28 LAB — HEPARIN LEVEL (UNFRACTIONATED): Heparin Unfractionated: 0.29 IU/mL — ABNORMAL LOW (ref 0.30–0.70)

## 2015-12-28 LAB — MAGNESIUM: Magnesium: 1.4 mg/dL — ABNORMAL LOW (ref 1.7–2.4)

## 2015-12-28 MED ORDER — METOPROLOL TARTRATE 1 MG/ML IV SOLN
2.5000 mg | INTRAVENOUS | Status: DC | PRN
  Administered 2015-12-28: 5 mg via INTRAVENOUS

## 2015-12-28 MED ORDER — MAGNESIUM SULFATE 4 GM/100ML IV SOLN
4.0000 g | Freq: Once | INTRAVENOUS | Status: AC
  Administered 2015-12-28: 4 g via INTRAVENOUS
  Filled 2015-12-28: qty 100

## 2015-12-28 MED ORDER — METOPROLOL TARTRATE 25 MG PO TABS
25.0000 mg | ORAL_TABLET | Freq: Two times a day (BID) | ORAL | Status: DC
  Administered 2015-12-28 – 2015-12-30 (×5): 25 mg via ORAL
  Filled 2015-12-28 (×8): qty 1

## 2015-12-28 MED ORDER — DIGOXIN 0.25 MG/ML IJ SOLN
0.2500 mg | Freq: Once | INTRAMUSCULAR | Status: AC
  Administered 2015-12-28: 0.25 mg via INTRAVENOUS
  Filled 2015-12-28: qty 2

## 2015-12-28 MED ORDER — METOPROLOL TARTRATE 1 MG/ML IV SOLN
INTRAVENOUS | Status: AC
  Filled 2015-12-28: qty 5

## 2015-12-28 MED ORDER — POTASSIUM CHLORIDE CRYS ER 20 MEQ PO TBCR
40.0000 meq | EXTENDED_RELEASE_TABLET | ORAL | Status: AC
  Administered 2015-12-28 (×2): 40 meq via ORAL
  Filled 2015-12-28 (×2): qty 2

## 2015-12-28 MED ORDER — ENOXAPARIN SODIUM 40 MG/0.4ML ~~LOC~~ SOLN
40.0000 mg | SUBCUTANEOUS | Status: DC
  Administered 2015-12-28: 40 mg via SUBCUTANEOUS
  Filled 2015-12-28: qty 0.4

## 2015-12-28 MED ORDER — SODIUM CHLORIDE 0.9 % IV BOLUS (SEPSIS): 250.0000 mL | Freq: Once | INTRAVENOUS | Status: DC

## 2015-12-28 MED ORDER — VANCOMYCIN 50 MG/ML ORAL SOLUTION
125.0000 mg | Freq: Four times a day (QID) | ORAL | Status: DC
  Administered 2015-12-28 – 2016-01-01 (×13): 125 mg via ORAL
  Filled 2015-12-28 (×29): qty 2.5

## 2015-12-28 MED ORDER — DILTIAZEM HCL 100 MG IV SOLR
5.0000 mg/h | INTRAVENOUS | Status: DC
  Administered 2015-12-28: 5 mg/h via INTRAVENOUS
  Filled 2015-12-28: qty 100

## 2015-12-28 NOTE — Progress Notes (Signed)
ANTICOAGULATION CONSULT NOTE - Initial Consult  Pharmacy Consult for Heparin Indication: chest pain/ACS  Allergies  Allergen Reactions  . Celebrex [Celecoxib]   . Codeine Other (See Comments)    Goes out of right state of mind.   . Lipitor [Atorvastatin]   . Morphine And Related   . Prozac [Fluoxetine Hcl]   . Vioxx [Rofecoxib]   . Xanax [Alprazolam] Other (See Comments)    Goes out of right state of mind.     Patient Measurements: Height: 5\' 6"  (167.6 cm) Weight: 160 lb 7.9 oz (72.8 kg) IBW/kg (Calculated) : 59.3  HEPARIN DW (KG): 72.6  Vital Signs: Temp: 97.8 F (36.6 C) (02/13 0759) Temp Source: Oral (02/13 0440) BP: 101/50 mmHg (02/13 0658) Pulse Rate: 74 (02/13 0500)  Labs:  Recent Labs  12/26/15 0826 12/26/15 1608 12/26/15 2131 12/27/15 0213 12/27/15 0508 12/27/15 0954 12/28/15 0409  HGB 13.7  --   --   --  11.6*  --  10.7*  HCT 42.1  --   --   --  35.8*  --  33.7*  PLT 260  --   --   --  207  --  207  APTT  --   --  >200*  --   --   --   --   LABPROT  --   --  17.9*  --   --   --   --   INR  --   --  1.47  --   --   --   --   HEPARINUNFRC  --   --   --   --  0.61  --  0.29*  CREATININE 1.22*  --   --   --   --  1.02* 0.98  TROPONINI 3.26* 4.21* 4.09* 4.04*  --   --   --     Estimated Creatinine Clearance: 32.7 mL/min (by C-G formula based on Cr of 0.98).   Medical History: Past Medical History  Diagnosis Date  . High cholesterol   . Hypertension   . Degenerative disc disease, lumbar   . Colitis   . C. difficile colitis   . Reflux   . Hiatal hernia     Medications:  See med rec  Assessment: 82 yo female presents with SOB. Recently treated with IV vancomycin and zosyn for PNA.  Patient does not have chest pain but was noted to have elevated troponin 3.2. Troponin up to 4.21 now trending down.Cardiology consulted and plan is to start heparin.  HL 0.29 this AM. Awaiting ECHO to determine length of heparin treatment. Will increase rate.  patient  Goal of Therapy:  Heparin level 0.3-0.7 units/ml Monitor platelets by anticoagulation protocol: Yes   Plan:  Increase heparin infusion  950 units/hr Check HL in 8 hours Check anti-Xa level daily while on heparin Continue to monitor H&H and platelets  Isac Sarna, BS [place], BCPS Clinical Pharmacist Pager [phone] 12/28/2015,8:52 AM

## 2015-12-28 NOTE — Progress Notes (Signed)
Elizabeth Oneal is currently on heparin protocol for a diagnosis of ACS/NSTEMI.  Current Labs: Hematology Lab Results  Component Value Date/Time   WBC 11.7* 12/28/2015 04:09 AM   RBC 3.73* 12/28/2015 04:09 AM   RBC 3.60* 02/11/2010 02:22 PM   HGB 10.7* 12/28/2015 04:09 AM   HCT 33.7* 12/28/2015 04:09 AM   MCV 90.3 12/28/2015 04:09 AM   MCH 28.7 12/28/2015 04:09 AM   PLATELETS  Date Value Ref Range Status  12/28/2015 207 150 - 400 K/uL Final   No results found for: PTT INR  Date Value Ref Range Status  12/26/2015 1.47 0.00 - 1.49 Final    Heparin IV infusion discontinued and cardiology plans only for VTE px with Lovenox. Concerned with friability of gut, age, and anticoagulation may predispose patient to GI bleed.  Plan: lovenox 40mg  sq daily Monitor labs and adjust if warranted  Thanks for allowing me to participate in the care of this patient,  Isac Sarna, BS [place], [place] Pharmacist Pager [phone]

## 2015-12-28 NOTE — Clinical Documentation Improvement (Signed)
Internal Medicine  Can the diagnosis of "Hypotension" be further specified and possibly linked to patient's Sepsis? Please document your response in next progress note NOT in BPA drop down box. Thank you!    Other  Clinically Undetermined  Supporting Information:  "Hypotension, likely related to hypovolemia/sepsis. Improving with IVF"  Serial lactic acid's running 2.17 and 2.3  BP's running 69/48 Map of 52, 68/54 Map of 61, 81/50 with Map of 59  Please exercise your independent, professional judgment when responding. A specific answer is not anticipated or expected.  Thank You, Zoila Shutter RN, BSN, [place] [phone]; Cell: [phone]

## 2015-12-28 NOTE — Progress Notes (Signed)
E-link MD/RN & Dr Darrick Meigs updated on Pt V-tach high rate=191. Pt has been awake/ alert

## 2015-12-28 NOTE — Progress Notes (Signed)
Called by RN that patient telemetry showing V. Tach, EKG obtained which shows A. fib with RVR. Called and discussed with Dr. Claiborne Billings, cardiology fellow on call who agrees that patient likely has A. fib with RVR. She already received 1 dose of Lopressor 5 mg IV 1 which dropped her blood pressure systolic to [id number]. Heart rate still in 150s.  On exam patient is alert, denies any pain.  Will give IV fluid bolus 250 mL 1, repeat if no improvement in blood pressure Check magnesium and BMP Patient potassium was 3.4 yesterday morning, will give K Dur 40 mg by mouth 2 If patient's blood pressure improves, will start her on Cardizem drip Patient is DO NOT RESUSCITATE

## 2015-12-28 NOTE — Clinical Documentation Improvement (Signed)
Internal Medicine  Can the diagnosis of Atrial Fibrillation be further specified? Please document response in next progress note NOT in BPA drop down box. Thanks!   Chronic Atrial fibrillation  Paroxysmal Atrial fibrillation  Permanent Atrial fibrillation  Persistent Atrial fibrillation  Other  Clinically Undetermined  Document any associated diagnoses/conditions.  Supporting Information:  "EKG obtained which shows A. fib with RVR. If patient's blood pressure improves, will start her on Cardizem drip".   Please exercise your independent, professional judgment when responding. A specific answer is not anticipated or expected.  Thank You,  Zoila Shutter RN, BSN, [place] [phone]; Cell: [phone]

## 2015-12-28 NOTE — Progress Notes (Signed)
[facility] PROGRESS NOTE  Elizabeth Oneal [id number] DOB: 10-14-1916 DOA: 12/26/2015 PCP: Vernell Morgans, MD  Assessment/Plan: 1. C.diff colitis. Patient recently treated for PNA with Vancomycin and Zosyn. Will continue oral Vancomycin and IVF. Discussed with GI who recommended a 6 week taper of Vancomycin.  2. Sepsis, likely secondary to c.diff. WBC trended down to 10.7, yet remains afebrile. CXR does not show obvious infiltrate and UA negative for infection. Will continue IVF. Patient had 1/4 cultures positive for coagulase negative staph. The remaining three BC have shown no growth. This is likely a containment.  PICC line reported to be placed 1/27, due to possibility of infection I recommend removing.  3. Acute hypoxic respiratory failure. We will continue supplemental O2 and wean as tolerated. 4. Hypotension, likely related to hypovolemia/sepsis. Improving with IVF.  5. Hypomagnesemia, will replace.  6. Hypokalemia, continue to replete.   7. NSTEMI, likely demand ischemia secondary to sepsis. Discussed with to Dr. Sallyanne Kuster, on call cardiology, who agreed with treating supportively at this time. Continue ASA and anticoagulation with Heparin. ECHO results as below. Patient noted to have elevated troponin on admission of 4.2. She did not have EKG changes at that time. ECHO shows walls motions abnormality and EF of 35%. She has been treated with 48 hours of Heparin and ASA. She is now on a beta blocker. Will await further input from cardiology.  8. New onset Afib with RVR. Overnight patient noted to have V. Tach on telemetry. Patient was treated with IV Cardizem and became hypertensive. Cardizem has since been discontinued and she is back on her oral metoprolol dosing. HR has improved and blood pressure is stabilizing.  9. CKD stage III. Creatinine is near baseline, continue to monitor.  10. HLD, continue statin   Code Status: DNR DVT Prophylaxis: Heparin  Family Communication: Family  at bedside  Disposition Plan: Continue to monitor in ICU.    Consultants:    Procedures:  ECHO Study Conclusions - Left ventricle: The cavity size was normal. Systolic function was moderately to severely reduced. The estimated ejection fraction was in the range of 30% to 35%. Dyskinesis of the apical myocardium. Severe hypokinesis of the mid-apicalanteroseptal, anterior, inferior, and inferoseptal myocardium; consistent with infarction in the distribution of the left anterior descending coronary artery. There was fusion of early and atrial contributions to ventricular filling. The study is not technically sufficient to allow evaluation of LV diastolic function.  Antibiotics:  Oral Vancocin 2/11>>  HPI/Subjective: Feeling ok, yet still has a bad cough. Denies any chest pain or trouble breathing. Bowel movents are fine.   Objective: Filed Vitals:   12/28/15 0430 12/28/15 0522  BP: 68/54 107/50  Pulse:    Temp:    Resp: 27     Intake/Output Summary (Last 24 hours) at 12/28/15 [id number] Last data filed at 12/28/15 0500  Gross per 24 hour  Intake    454 ml  Output      0 ml  Net    454 ml   Filed Weights   12/26/15 0746  Weight: 72.576 kg (160 lb)    Exam: General: NAD Cardiovascular: Regular Respiratory: CTAB.  Abdomen: soft, non tender, no distention, bowel sounds normal Musculoskeletal: No edema b/l   Data Reviewed: Basic Metabolic Panel:  Recent Labs Lab 12/26/15 0826 12/27/15 0954 12/28/15 0409  NA 140 137 138  K 2.8* 3.4* 3.4*  CL 99* 104 106  CO2 27 25 22   GLUCOSE 140* 99 130*  BUN 20 23* 24*  CREATININE 1.22* 1.02* 0.98  CALCIUM 8.7* 8.0* 8.0*   Liver Function Tests:  Recent Labs Lab 12/26/15 0826  AST 30  ALT 13*  ALKPHOS 58  BILITOT 0.8  PROT 6.1*  ALBUMIN 2.9*    CBC:  Recent Labs Lab 12/26/15 0826 12/27/15 0508 12/28/15 0409  WBC 20.5* 13.9* 11.7*  NEUTROABS 17.8*  --   --   HGB 13.7 11.6* 10.7*   HCT 42.1 35.8* 33.7*  MCV 88.1 89.9 90.3  PLT 260 207 207   Cardiac Enzymes:  Recent Labs Lab 12/26/15 0826 12/26/15 1608 12/26/15 2131 12/27/15 0213  TROPONINI 3.26* 4.21* 4.09* 4.04*     Recent Results (from the past 240 hour(s))  C difficile quick scan w PCR reflex     Status: Abnormal   Collection Time: 12/26/15  8:17 AM  Result Value Ref Range Status   C Diff antigen POSITIVE (A) NEGATIVE Final   C Diff toxin POSITIVE (A) NEGATIVE Final   C Diff interpretation Positive for toxigenic C. difficile  Final    Comment: CRITICAL RESULT CALLED TO, READ BACK BY AND VERIFIED WITH: BLACKBURN C. AT 0940A ON [id number] BY THOMPSON S.   Culture, blood (Routine X 2) w Reflex to ID Panel     Status: None (Preliminary result)   Collection Time: 12/26/15  8:43 AM  Result Value Ref Range Status   Specimen Description BLOOD RIGHT HAND  Final   Special Requests BOTTLES DRAWN AEROBIC AND ANAEROBIC 6CC  Final   Culture NO GROWTH 1 DAY  Final   Report Status PENDING  Incomplete  Culture, blood (Routine X 2) w Reflex to ID Panel     Status: None (Preliminary result)   Collection Time: 12/26/15  9:32 AM  Result Value Ref Range Status   Specimen Description BLOOD LEFT ARM  Final   Special Requests BOTTLES DRAWN AEROBIC ONLY 6CC  Final   Culture  Setup Time   Final    GRAM POSITIVE COCCI IN CLUSTERS AEROBIC BOTTLE ONLY CRITICAL RESULT CALLED TO, READ BACK BY AND VERIFIED WITH: M [facility] AT 1015 12/27/15 BY L BENFIELD    Culture   Final    GRAM POSITIVE COCCI Performed at [facility]    Report Status PENDING  Incomplete  MRSA PCR Screening     Status: None   Collection Time: 12/26/15  6:45 PM  Result Value Ref Range Status   MRSA by PCR NEGATIVE NEGATIVE Final    Comment:        The GeneXpert MRSA Assay (FDA approved for NASAL specimens only), is one component of a comprehensive MRSA colonization surveillance program. It is not intended to diagnose MRSA infection nor to  guide or monitor treatment for MRSA infections.   Culture, blood (x 2)     Status: None (Preliminary result)   Collection Time: 12/26/15  9:31 PM  Result Value Ref Range Status   Specimen Description LEFT ANTECUBITAL  Final   Special Requests BOTTLES DRAWN AEROBIC ONLY 6CC ONLY  Final   Culture NO GROWTH < 24 HOURS  Final   Report Status PENDING  Incomplete  Culture, blood (x 2)     Status: None (Preliminary result)   Collection Time: 12/26/15  9:41 PM  Result Value Ref Range Status   Specimen Description BLOOD LEFT ARM  Final   Special Requests BOTTLES DRAWN AEROBIC AND ANAEROBIC 6CC EACH  Final   Culture NO GROWTH < 24 HOURS  Final   Report Status PENDING  Incomplete     Studies: Dg Chest 2 View  12/26/2015  CLINICAL DATA:  Shortness of breath. EXAM: CHEST  2 VIEW COMPARISON:  Earlier today FINDINGS: Right upper extremity PICC with tip near the SVC origin. Cardiomegaly which is likely accentuated by rotation. Calcified mediastinal lymph nodes. Low lung volumes with bilateral atelectasis and small left effusion. No definite focal consolidation. Limited lateral view due to positioning. IMPRESSION: Low volumes with bilateral atelectasis and small left effusion. No consolidating pneumonia is suspected, but consider follow-up. Electronically Signed   By: Monte Fantasia M.D.   On: 12/26/2015 09:35   Dg Chest Port 1 View  12/26/2015  CLINICAL DATA:  Hypoxemia.  Recent history of pneumonia. EXAM: PORTABLE CHEST 1 VIEW COMPARISON:  09/16/2015; 07/30/2012 FINDINGS: Grossly unchanged enlarged cardiac silhouette and mediastinal contours given persistently reduced lung volumes and patient rotation. Atherosclerotic plaque within the abdominal aorta. Partially calcified right hilar lymph nodes, unchanged. Interval placement of right upper extremity approach PICC line with tip projecting over the central aspect of the right innominate vein. Lung volumes remain reduced with grossly unchanged bibasilar  heterogeneous opacities. No pleural effusion. No evidence of edema. No acute osseous abnormalities. IMPRESSION: 1. Persistently reduced lung volumes with associated bibasilar opacities, atelectasis versus infiltrate. No new focal airspace opacities. Further evaluation with a PA and lateral chest radiograph may be obtained as clinically indicated. 2. Interval placement of right upper extremity approach PICC line with tip projected over the central aspect of the right innominate vein. Electronically Signed   By: Sandi Mariscal M.D.   On: 12/26/2015 08:31    Scheduled Meds: . aspirin EC  81 mg Oral Daily  . metoprolol      . potassium chloride  40 mEq Oral Q4H  . sertraline  25 mg Oral Daily  . sodium chloride  250 mL Intravenous Once  . vancomycin  500 mg Oral 4 times per day  . vancomycin  1,000 mg Intravenous Q24H   Continuous Infusions: . diltiazem (CARDIZEM) infusion    . heparin 850 Units/hr (12/28/15 0500)    Active Problems:   Hyperlipidemia LDL goal <130   C. difficile colitis   Sepsis ([place])   NSTEMI (non-ST elevated myocardial infarction) (HCC)   Hypokalemia   CKD (chronic kidney disease) stage 3, GFR 30-59 ml/min   Acute respiratory failure with hypoxia ([place])    Time spent: 25 minutes   Oletha Tolson. MD  [facility] Pager [phone]. If 7PM-7AM, please contact night-coverage at [email or web address], password [facility] 12/28/2015, 6:32 AM  LOS: 2 days      By signing my name below, I, Rennis Harding, attest that this documentation has been prepared under the direction and in the presence of Kathie Dike, MD. Electronically signed: Rennis Harding, Scribe. 12/28/2015 10:55am  I, Dr. Kathie Dike, personally performed the services described in this documentaiton. All medical record entries made by the scribe were at my direction and in my presence. I have reviewed the chart and agree that the record reflects my personal performance and is accurate and complete  Kathie Dike, MD, 12/28/2015 11:29 AM           v

## 2015-12-28 NOTE — Progress Notes (Signed)
Pt's son [name] updated on pt condition .

## 2015-12-28 NOTE — Consult Note (Addendum)
CARDIOLOGY CONSULT NOTE   Patient ID: Elizabeth Oneal MRN: [id number] DOB/AGE: 1916-07-14 82 y.o.  Admit Date: 12/26/2015 Requesting Physician: [facility] - Kathie Dike, MD Primary Physician: Vernell Morgans, MD Consulting Cardiologist: Rozann Lesches MD Reason for Consultation: NSTEMI, atrial fibrillation, cardiomyopathy  Clinical Summary Elizabeth Oneal is a 82 y.o.female resident of [facility] with history of hypertension and hyperlipidemia, recent pneumonia treated with Vancomycin and Zosyn. She is currently hospitalized having presented with hypoxic respiratory failure, suspected sepsis response, and diagnosis of Clostridium difficile colitis. She had been experiencing frequent bowel movements and diarrhea, also noted to be febrile with leukocytosis.  On arrival to ER, she was found to be febrile with temp of 102.9, O2 sat 95% on 2 liters, BP 139/63, HR 125 bpm. Troponin I 3.26, 4.21. Lactic Acid 2.17. WBC 20.5. Potassium 2.8. Creatinine of 1.22. She was positive for C-Diff. ECG with LBBB and atrial fib with RVR, rate of 186 bpm. She was placed on a diltiazem gtt and heparin, also giving IV fluids for hydration. Case was discussed between Dr. Roderic Palau and Dr. Sallyanne Kuster (on call for cardiology).  Echocardigram completed yesterday demonstrates LVEF of 30-35% with WMA consistent with  infarction in LAD distribution. Echo from 02/2010 showed LVEF  70-75% at that time.   She currently denies chest pain or dyspnea. She complains of frequent cough, mildly productive. States that she has no active abdominal pain or chest pain, bowel movements have slowed down.  Allergies  Allergen Reactions  . Celebrex [Celecoxib]   . Codeine Other (See Comments)    Goes out of right state of mind.   . Lipitor [Atorvastatin]   . Morphine And Related   . Prozac [Fluoxetine Hcl]   . Vioxx [Rofecoxib]   . Xanax [Alprazolam] Other (See Comments)    Goes out of right state of mind.     Medications Scheduled  Medications: . aspirin EC  81 mg Oral Daily  . enoxaparin (LOVENOX) injection  40 mg Subcutaneous Q24H  . magnesium sulfate 1 - 4 g bolus IVPB  4 g Intravenous Once  . metoprolol      . metoprolol tartrate  25 mg Oral BID  . sertraline  25 mg Oral Daily  . sodium chloride  250 mL Intravenous Once  . vancomycin  125 mg Oral 4 times per day   PRN Medications: acetaminophen, nitroGLYCERIN, ondansetron (ZOFRAN) IV   Past Medical History  Diagnosis Date  . Hyperlipidemia   . Essential hypertension   . Degenerative disc disease, lumbar   . C. difficile colitis   . Reflux   . Hiatal hernia   . History of pneumonia     Past Surgical History  Procedure Laterality Date  . Abdominal hysterectomy    . Cholecystectomy    . Appendectomy    . Abdominal surgery    . Fracture surgery    . Cataract surgery    . Joint replacement    . Colon surgery  2009    Family History  Problem Relation Age of Onset  . Heart disease Sister     Social History Elizabeth Oneal reports that she has never smoked. She does not have any smokeless tobacco history on file. Elizabeth Oneal reports that she does not drink alcohol.  Review of Systems Complete review of systems are found to be negative unless outlined in H&P above. Decreased hearing. Recent intermittent cough and chest congestion, diarrhea.  Physical Examination Blood pressure 117/54, pulse 101, temperature 97.8 F (36.6 C),  temperature source Oral, resp. rate 19, height _0  (1.676 m), weight 160 lb 7.9 oz (72.8 kg), SpO2 98 %.  Intake/Output Summary (Last 24 hours) at 12/28/15 1158 Last data filed at 12/28/15 0500  Gross per 24 hour  Intake    420 ml  Output      0 ml  Net    420 ml    Telemetry: Currently sinus rhythm and sinus tachycardia with PACs.  GEN: Elderly woman in no active distress. HEENT: Conjunctiva and lids normal, oropharynx clear. Neck: Supple, no elevated JVP or carotid bruits, no thyromegaly. Lungs: Decreased breath sounds  with scattered rhonchi, frequent coughing.  Cardiac: Regular rate and rhythm, tachycardic, no S3, soft systolic murmur, no pericardial rub. Abdomen: Soft, hyperactive bowel sounds, no hepatomegaly, no guarding or rebound. Extremities: No pitting edema, venous stasis, distal pulses 1-2+. Skin: Warm and dry. Musculoskeletal: Positive for kyphosis.  Neuropsychiatric: Alert and oriented x3, affect grossly appropriate.  Prior Cardiac Testing/Procedures  1. Echocardiogram; 12/27/2015.  Left ventricle: The cavity size was normal. Systolic function was moderately to severely reduced. The estimated ejection fraction was in the range of 30% to 35%. Dyskinesis of the apical myocardium. Severe hypokinesis of the mid-apicalanteroseptal, anterior, inferior, and inferoseptal myocardium; consistent with infarction in the distribution of the left anterior descending coronary artery. There was fusion of early and atrial contributions to ventricular filling. The study is not technically sufficient to allow evaluation of LV diastolic function.  NM Study: 05/14/2003. MYOCARDIAL PERFUSION W/SPECT/EJECTION FRACTION/WALL MOTION THE PATIENT UNDERWENT PHARMACOLOGIC STRESS TEST WITH IV ADENOSINE. AT PEAK PHARMACOLOGIC EFFECT, 30 MCI TC-4M SESTAMIBI WAS INJECTED INTRAVENOUSLY FOR MYOCARDIAL PERFUSION IMAGING. RESTING EXAM PERFORMED ON A SECOND DAY USING 20 MCI TC-4M SESTAMIBI. MYOCARDIAL PERFUSION SPECT IMAGES OBTAINED AFTER IV ADENOSINE ARE NORMAL. RESTING EXAM IS UNCHANGED. NO PULMONARY UPTAKE OF TRACER. NORMAL LEFT VENTRICULAR EJECTION FRACTION OF 69% AS CALCULATED FROM GATED SPECT IMAGES AFTER EXERCISE. THIS IS DERIVED FROM END DIASTOLIC VOLUME CALCULATION OF 62 ML AND END SYSTOLIC VOLUME OF 19 ML. NORMAL WALL MOTION. IMPRESSION NORMAL EXAM.  Lab Results  Basic Metabolic Panel:  Recent Labs Lab 12/26/15 0826 12/27/15 0954 12/28/15 0409 12/28/15 0634  NA 140 137 138  --    K 2.8* 3.4* 3.4*  --   CL 99* 104 106  --   CO2 _1 --   GLUCOSE 140* 99 130*  --   BUN 20 23* 24*  --   CREATININE 1.22* 1.02* 0.98  --   CALCIUM 8.7* 8.0* 8.0*  --   MG  --   --   --  1.4*    Liver Function Tests:  Recent Labs Lab 12/26/15 0826  AST 30  ALT 13*  ALKPHOS 58  BILITOT 0.8  PROT 6.1*  ALBUMIN 2.9*    CBC:  Recent Labs Lab 12/26/15 0826 12/27/15 0508 12/28/15 0409  WBC 20.5* 13.9* 11.7*  NEUTROABS 17.8*  --   --   HGB 13.7 11.6* 10.7*  HCT 42.1 35.8* 33.7*  MCV 88.1 89.9 90.3  PLT 260 207 207    Cardiac Enzymes:  Recent Labs Lab 12/26/15 0826 12/26/15 1608 12/26/15 2131 12/27/15 0213  TROPONINI 3.26* 4.21* 4.09* 4.04*    ECG: Tracing from 12/26/2015 showed sinus rhythm with incomplete left bundle branch block. Follow-up tracing from 12/28/2015 showed rapid atrial fibrillation with incomplete left bundle branch block and accentuation and ST segment abnormalities in the anterior leads.  Impression and Recommendations  1. NSTEMI: Peak troponin I  4.2 and starting to trend down. This is documented in the setting of presentation with hypoxic respiratory failure and recent pneumonia, active Clostridium difficile colitis with possible sepsis, fevers, and rapid atrial fibrillation. She has had an intermittent cough but does not report any active chest pain. ECG shows incomplete left bundle branch block pattern, no old tracing available at this time for review. Has been on Heparin since 12/26/2015.  2. Paroxysmal atrial fibrillation with RVR: She is in sinus rhythm at this time. Switch to BB from IV diltiazem. She is currently on heparin gtt. CHADS VASC Score of 5. Would hold off on starting oral anticoagulant now due to probable friability of bowel mucosa in the setting of C-diff.   3. Cardiomyopathy: LVEF 30-35% with wall motion abnormalities suggesting ischemic cardiomyopathy. Absolute duration of LV dysfunction is unclear however. Will discontinue  diltiazem gtt  and change to po metoprolol. Would not add diuretics at this time as she continues to have frequent stools, to avoid dehydration. BP is stable.  4. Clostridium difficile colitis: Continues on IV abx with management per primary team.  5. DNR  Signed: Phill Myron. Lawrence NP [place]  12/28/2015, 11:58 AM Co-Sign MD   Attending note:  Patient seen and examined. I reviewed records and extensively modified above note by Ms. Lawrence NP. I met with Elizabeth Oneal today, and also her grandson Pilar Plate in the room who had me on speaker phone reportedly with his brother Joneen Caraway during the encounter. I asked them both their impression of the patient's current status, and offered my viewpoint on her present cardiac situation. Elizabeth Oneal indicated that her main complaint was of a frequent, mildly productive cough, otherwise she was not experiencing any abdominal pain or chest pain. She states that her diarrhea is improving. Pilar Plate indicated that his impression was that Elizabeth Oneal seemed to be doing somewhat better today.  On examination Elizabeth Oneal was in no distress. She is currently in sinus rhythm by telemetry although had episodes of rapid atrial fibrillation documented overnight. She did not have elevated JVP. Lungs exhibited coarse breath sounds with scattered rhonchi but no wheezing. Cardiac exam revealed RRR with soft systolic murmur and no gallop. Abdomen revealed hyperactive bowel sounds, no guarding. Extremities revealed no pitting edema. Lab work shows potassium 3.4, creatinine 0.96, troponin I down to 4.04, WBC down to 11.7, hemoglobin 10.7, platelets 207. Blood cultures negative so far. Chest x-ray showed low lung volumes in general with bibasilar atelectasis, possibly small left pleural effusion. I reviewed her recent ECGs as well. Echocardiogram during this hospital stay shows LVEF 30-35% with wall motion abnormalities suggesting LAD distribution infarct, indeterminate diastolic function.  Elizabeth Oneal  presents with enzymatic evidence of NSTEMI in the face of comorbid illness including hypoxic respiratory failure with recent pneumonia, Clostridium difficile colitis with early sepsis response, fevers, rapid atrial fibrillation, and newly documented cardiomyopathy that is possibly ischemic in etiology based on wall motion abnormalities with LVEF 30-35%. She remains at high risk for adverse outcome, particularly at advanced age and with complex illness. She presently has a DO NOT RESUSCITATE status. Recommendation at this time is titration of medical therapy, would not push to pursue a cardiac catheterization at this point, particularly in light of recent fevers and infection. Since she is coming up on 48 hours having been on heparin with NSTEMI and not having any active chest pain, agree with discontinuing and a switch to DVT dose prophylaxis Lovenox. Would otherwise continue aspirin and Lopressor which was just initiated.  Hold off on initiating statin in light of Lipitor allergy. Depending on blood pressure and heart rate, can further up titrate Lopressor as tolerated. If PAF burden remains a problem, consider adding oral amiodarone next. Would hold off on initiating oral anticoagulant therapy until her clinical course is better understood. With active colitis, risk of GI bleeding would be increased as well. If she remains stable, suggest adding Plavix to her aspirin instead of oral anticoagulant. Would in general not anticipate pursuing aggressive invasive cardiac evaluation, particularly in light of advanced age and DO NOT RESUSCITATE status. If there are significant questions about this or family members do not agree, would recommend a family meeting and palliative care discussion. This can be coordinated by the primary team.  Satira Sark, M.D., F.A.C.C.

## 2015-12-29 DIAGNOSIS — I480 Paroxysmal atrial fibrillation: Secondary | ICD-10-CM | POA: Diagnosis not present

## 2015-12-29 LAB — CBC
HCT: 35.8 % — ABNORMAL LOW (ref 36.0–46.0)
HEMOGLOBIN: 11.3 g/dL — AB (ref 12.0–15.0)
MCH: 28.3 pg (ref 26.0–34.0)
MCHC: 31.6 g/dL (ref 30.0–36.0)
MCV: 89.7 fL (ref 78.0–100.0)
PLATELETS: 182 10*3/uL (ref 150–400)
RBC: 3.99 MIL/uL (ref 3.87–5.11)
RDW: 14 % (ref 11.5–15.5)
WBC: 8.3 10*3/uL (ref 4.0–10.5)

## 2015-12-29 LAB — CULTURE, BLOOD (ROUTINE X 2)

## 2015-12-29 LAB — BASIC METABOLIC PANEL
ANION GAP: 9 (ref 5–15)
BUN: 31 mg/dL — AB (ref 6–20)
CHLORIDE: 112 mmol/L — AB (ref 101–111)
CO2: 20 mmol/L — ABNORMAL LOW (ref 22–32)
Calcium: 8.6 mg/dL — ABNORMAL LOW (ref 8.9–10.3)
Creatinine, Ser: 1.58 mg/dL — ABNORMAL HIGH (ref 0.44–1.00)
GFR calc Af Amer: 30 mL/min — ABNORMAL LOW (ref >60)
GFR, EST NON AFRICAN AMERICAN: 26 mL/min — AB (ref >60)
GLUCOSE: 117 mg/dL — AB (ref 65–99)
POTASSIUM: 4.1 mmol/L (ref 3.5–5.1)
Sodium: 141 mmol/L (ref 135–145)

## 2015-12-29 MED ORDER — ENOXAPARIN SODIUM 30 MG/0.3ML ~~LOC~~ SOLN
30.0000 mg | SUBCUTANEOUS | Status: DC
  Administered 2015-12-29 – 2015-12-31 (×3): 30 mg via SUBCUTANEOUS
  Filled 2015-12-29 (×3): qty 0.3

## 2015-12-29 MED ORDER — CLOPIDOGREL BISULFATE 75 MG PO TABS
75.0000 mg | ORAL_TABLET | Freq: Every day | ORAL | Status: DC
  Administered 2015-12-29 – 2016-01-01 (×4): 75 mg via ORAL
  Filled 2015-12-29 (×5): qty 1

## 2015-12-29 MED ORDER — SODIUM CHLORIDE 0.45 % IV SOLN
INTRAVENOUS | Status: DC
  Administered 2015-12-29 – 2015-12-30 (×3): via INTRAVENOUS

## 2015-12-29 NOTE — Progress Notes (Signed)
Patient with orders to be transfer to Unit 300. Report called to Magda Paganini, Therapist, sports. Patient stable. Patient transported to room 304.

## 2015-12-29 NOTE — Progress Notes (Signed)
Pharmacy Consult: Lovenox for VTE prophylaxis.  Patient Measurements: Height: 5\' 6"  (167.6 cm) Weight: 160 lb 4.4 oz (72.7 kg) IBW/kg (Calculated) : 59.3 Body mass index is 25.88 kg/(m^2).   VITALS Filed Vitals:   12/29/15 1114 12/29/15 1200  BP: 116/44 117/53  Pulse: 83 79  Temp:    Resp: 18 25    INR Last Three Days: Recent Labs     12/26/15  2131  INR  1.47    CBC:    Component Value Date/Time   WBC 8.3 12/29/2015 0541   RBC 3.99 12/29/2015 0541   RBC 3.60* 02/11/2010 1422   HGB 11.3* 12/29/2015 0541   HCT 35.8* 12/29/2015 0541   PLT 182 12/29/2015 0541   MCV 89.7 12/29/2015 0541   MCH 28.3 12/29/2015 0541   MCHC 31.6 12/29/2015 0541   RDW 14.0 12/29/2015 0541   LYMPHSABS 0.8 12/26/2015 0826   MONOABS 1.9* 12/26/2015 0826   EOSABS 0.0 12/26/2015 0826   BASOSABS 0.1 12/26/2015 0826    RENAL FUNCTION: Estimated Creatinine Clearance: 20.3 mL/min (by C-G formula based on Cr of 1.58).  Assessment: Dose stable for age, weight, renal function and indication.  Plan: Dose adjusted for reduced renal function. Sign off.  Pricilla Larsson, [facility] 12/29/2015 12:17 PM

## 2015-12-29 NOTE — Progress Notes (Signed)
Primary Cardiologist: Rozann Lesches MD  Cardiology Specific Problem List: 1.Paroxysmal Atrial fib 2. NSTEMI 3.Cardiomyopathy   Subjective:    Complaints of frequent non-productive coughing.   Objective:   Temp:  [97.5 F (36.4 C)-98.3 F (36.8 C)] 97.7 F (36.5 C) (02/14 0400) Pulse Rate:  [82-112] 92 (02/14 0700) Resp:  [16-29] 25 (02/14 0700) BP: (111-139)/(41-85) 138/65 mmHg (02/14 0700) SpO2:  [96 %-100 %] 96 % (02/14 0700) Weight:  [160 lb 4.4 oz (72.7 kg)] 160 lb 4.4 oz (72.7 kg) (02/14 0500) Last BM Date: 12/28/15  Filed Weights   12/26/15 0746 12/28/15 0500 12/29/15 0500  Weight: 160 lb (72.576 kg) 160 lb 7.9 oz (72.8 kg) 160 lb 4.4 oz (72.7 kg)    Intake/Output Summary (Last 24 hours) at 12/29/15 0758 Last data filed at 12/28/15 1900  Gross per 24 hour  Intake 734.09 ml  Output      0 ml  Net 734.09 ml    Telemetry:  SR with LBBB, rates in the 80's and 90's.  Exam:  General: No acute distress.  HEENT: Conjunctiva and lids normal, oropharynx clear.  Lungs: Clear to auscultation, nonlabored. Some upper airway congestion with coughing.   Cardiac: No elevated JVP or bruits. RRR, no gallop or rub.   Abdomen: Normoactive bowel sounds, nontender, nondistended.  Extremities: No pitting edema, distal pulses full.  Neuropsychiatric: Alert and oriented x3, affect appropriate.  Echocardiogram 12/27/2015 Left ventricle: The cavity size was normal. Systolic function was moderately to severely reduced. The estimated ejection fraction was in the range of 30% to 35%. Dyskinesis of the apical myocardium. Severe hypokinesis of the mid-apicalanteroseptal, anterior, inferior, and inferoseptal myocardium; consistent with infarction in the distribution of the left anterior descending coronary artery. There was fusion of early and atrial contributions to ventricular filling. The study is not technically sufficient to allow evaluation of LV  diastolic function.  Lab Results:  Basic Metabolic Panel:  Recent Labs Lab 12/27/15 0954 12/28/15 0409 12/28/15 0634 12/29/15 0545  NA 137 138  --  141  K 3.4* 3.4*  --  4.1  CL 104 106  --  112*  CO2 25 22  --  20*  GLUCOSE 99 130*  --  117*  BUN 23* 24*  --  31*  CREATININE 1.02* 0.98  --  1.58*  CALCIUM 8.0* 8.0*  --  8.6*  MG  --   --  1.4*  --     Liver Function Tests:  Recent Labs Lab 12/26/15 0826  AST 30  ALT 13*  ALKPHOS 58  BILITOT 0.8  PROT 6.1*  ALBUMIN 2.9*    CBC:  Recent Labs Lab 12/27/15 0508 12/28/15 0409 12/29/15 0541  WBC 13.9* 11.7* 8.3  HGB 11.6* 10.7* 11.3*  HCT 35.8* 33.7* 35.8*  MCV 89.9 90.3 89.7  PLT 207 207 182    Cardiac Enzymes:  Recent Labs Lab 12/26/15 1608 12/26/15 2131 12/27/15 0213  TROPONINI 4.21* 4.09* 4.04*   Coagulation:  Recent Labs Lab 12/26/15 2131  INR 1.47     Medications:   Scheduled Medications: . aspirin EC  81 mg Oral Daily  . enoxaparin (LOVENOX) injection  40 mg Subcutaneous Q24H  . metoprolol tartrate  25 mg Oral BID  . sertraline  25 mg Oral Daily  . sodium chloride  250 mL Intravenous Once  . vancomycin  125 mg Oral 4 times per day    Infusions:    PRN Medications: acetaminophen, nitroGLYCERIN, ondansetron (ZOFRAN) IV  Assessment and Plan:   1. NSTEMI: No further troponin cycled with initial troponin 4.04. She denies chest pain or dyspnea. Heparin discontinued yesterday. She was placed on BB and ASA. No planned invasive procedures due to co-morbidities and age. Continue medical management. This has been discussed with the family by Dr. Domenic Polite.   2. Paroxysmal Atrial fib: Now in SR with incomplete LBBB. She is currently not a candidate for NOAC at this time, with ongoing C-Diff and inflamed bowel mucosa. Likely related to acute febrile state. Continue rate control.  3.  Systolic Dysfunction:  Likely ischemic cardiomyopathy with multiple WMA with distribution in the LAD  territory per echo report. No diuretics at this time as she is continuing to be treated for pneumonia and C-diff, to avoid dehydration.   4. DNR.   Phill Myron. Lawrence NP [place]  12/29/2015, 7:58 AM   Patient seen and discussed with NP Purcell Nails, I agree with her documentation above. 82 yo female with history of HTN and cdiff admitted with SOB. Admitted with sepsis secondary to cdiff. Transiently hypotensive but responded to IVFs. Cardiology consulted for new onset afib with LBBB as well as NSTEMI, along with new diagnosis of systolic heart failure with LVEF 30-35%>   For afib she is rate controlled with metoprolol 25mg  bid. She has not been started on anticoag at this time due to transient afib (she is now back in NSR) in setting of acute illness and concern for GI bleeding in setting of active colitis. NSTEMI with peak trop of 4.21 trending down in setting of cdiff sepsis, hypotension, and tachycardia. Managed medically given her advanced age and comorbidities. She is on aspirin, lopressor. She has statin allergy, soft bp's at times and have not started ACE or ARB, also want to allow room with bp in case beta blocker needs titration given her afib. She also has AKI this AM. Echo this admit LVEF 30-35%, dyskinesis of apical myocardium suggestive of CAD/ischemic heart disease vs stress induced CM/Takotsubo CM. Would change to Toprol XL in setting of systolic dysfunction once hemodynamics further stabilize. Keep K at 4 and Mg at 2. In setting of NSTEMI and medical therapy will start plavix 75mg  daily.   Zandra Abts MD

## 2015-12-29 NOTE — Progress Notes (Signed)
Patient with order to remove PICC line. No insertion date or measurements recorded. Notified [facility], nurse reported the PICC was put in at [facility], the measurement was 40cm. PICC removed as ordered, measured 40cm. No complications.

## 2015-12-29 NOTE — Care Management Note (Signed)
Case Management Note  Patient Details  Name: Elizabeth Oneal MRN: [id number] Date of Birth: Jul 09, 1916  Subjective/Objective:                  Pt admitted for sepsis secondary to c-diff. Pt is from [facility]. Anticipate pt will return to SNF at [place] is aware of DC plan and will arrange for return to facility when appropriate.   Action/Plan: No CM needs anticipated.   Expected Discharge Date:      12/31/2015            Expected Discharge Plan:  Skilled Nursing Facility  In-House Referral:  Clinical Social Work  Discharge planning Services  CM Consult  Post Acute Care Choice:  NA Choice offered to:  NA  DME Arranged:    DME Agency:     HH Arranged:    [place] Agency:     Status of Service:  Completed, signed off  Medicare Important Message Given:    Date Medicare IM Given:    Medicare IM give by:    Date Additional Medicare IM Given:    Additional Medicare Important Message give by:     If discussed at [place] of Stay Meetings, dates discussed:    Additional Comments:  Sherald Barge, RN 12/29/2015, 4:15 PM

## 2015-12-29 NOTE — Progress Notes (Addendum)
[facility] PROGRESS NOTE  Elizabeth Oneal [id number] DOB: August 05, 1916 DOA: 12/26/2015 PCP: Vernell Morgans, MD  Summary  69 yow with history of C. Diff presented from SNF with complaints of increased hypoxia and tachycardia, was found to have sepsis. Patient was recently treated with Vancomycin and Zosyn prior to admission for PNA. She was admitted with C diff colitis with associated sepsis. Per GI a 6 week taper of Vancomycin has been recommended. She was also noted to have elevated troponin of 4.2 on admission consistent with NSTEMI, for which cardiology was consulted. She was treated conservatively, since she is not a good candidate for invasive cardiac work up. She developed transient a fib, but has resolved back to SR with continuation of oral metoprolol. She will discharge back to SNF when further improved. Anticipate discharge in 1-2 days  Assessment/Plan: 1. C.diff colitis. Patient recently treated for PNA with Vancomycin and Zosyn. Will continue oral Vancomycin and IVF. Per GI 6 week taper of Vancomycin, recommended. She was also noted to be on oral budesonide prior to admission for unclear reasons. Per GI, this should be discontinued until she follow up with them in the outpatient setting. 2. Sepsis, likely secondary to c.diff. WBC now WNL and she remains afebrile. CXR did not show obvious infiltrate and UA negative for infection. Will continue IVF. Patient had 1/4 cultures positive for coagulase negative staph. The remaining three BC have shown no growth. This is likely a containment. PICC line that was placed prior to admission will be removed. Acute hypoxic respiratory failure. We will continue supplemental O2 and wean as tolerated. 3. Hypotension, likely related to hypovolemia/sepsis. Improving with IVF.  4. Hypomagnesemia, replaced.  5. Hypokalemia, replaced.  6. NSTEMI, likely demand ischemia secondary to sepsis. Discussed with to Dr. Sallyanne Kuster, on call cardiology, who agreed with  treating supportively at this time. Continue ASA and anticoagulation with Heparin. ECHO results as below. Patient noted to have elevated troponin on admission of 4.2. She did not have EKG changes at that time. ECHO shows walls motions abnormality and EF of 35%. She has been treated with 48 hours of Heparin and ASA. Will continue on ASA and Plavix. Appreciate cardiology recommendations.  7. New onset Afib with RVR. Overnight patient noted to transient Afib treated with IV Cardizem. She has since been tranistioned to oral metoprolol and is back in SR. She not a candidate for anticoagulation due to ongoing colitis and risk of bleeding.  8. AKI on CKD stage III. Possibly related to transient hypotension. Creatinine elevated today 1.58. Will start on gentle hydration and monitor output.  9. Cardiomyopathy, likely ischemic. EF of 30-35%. Continue Beta Blocker and monitor volume status. Not a good candidate for invasive cardiac work up. 10. HLD, continue statin  Code Status: DNR DVT Prophylaxis: Lovenox  Family Communication: Grandson at bedside  Disposition Plan:Transfer to medical bed.    Consultants:  Cardiology   Procedures:  ECHO Study Conclusions - Left ventricle: The cavity size was normal. Systolic function was moderately to severely reduced. The estimated ejection fraction was in the range of 30% to 35%. Dyskinesis of the apical myocardium. Severe hypokinesis of the mid-apicalanteroseptal, anterior, inferior, and inferoseptal myocardium; consistent with infarction in the distribution of the left anterior descending coronary artery. There was fusion of early and atrial contributions to ventricular filling. The study is not technically sufficient to allow evaluation of LV diastolic function.  Antibiotics:  Oral Vancocin 2/11>>  HPI/Subjective: Breathing and stomach are fine. Denies any diarrhea today.  Objective: Filed Vitals:   12/29/15 0500 12/29/15 0600   BP: 121/45 135/58  Pulse: 84 93  Temp:    Resp: 25 25    Intake/Output Summary (Last 24 hours) at 12/29/15 0651 Last data filed at 12/28/15 1900  Gross per 24 hour  Intake 734.09 ml  Output      0 ml  Net 734.09 ml   Filed Weights   12/26/15 0746 12/28/15 0500 12/29/15 0500  Weight: 72.576 kg (160 lb) 72.8 kg (160 lb 7.9 oz) 72.7 kg (160 lb 4.4 oz)    Exam: General: NAD Cardiovascular: RRR, S1, S2  Respiratory: clear bilaterally, No wheezing, rales or rhonchi Abdomen: soft, non tender, no distention , bowel sounds normal Musculoskeletal: No edema b/l   Data Reviewed: Basic Metabolic Panel:  Recent Labs Lab 12/26/15 0826 12/27/15 0954 12/28/15 0409 12/28/15 0634 12/29/15 0545  NA 140 137 138  --  141  K 2.8* 3.4* 3.4*  --  4.1  CL 99* 104 106  --  112*  CO2 27 25 22   --  20*  GLUCOSE 140* 99 130*  --  117*  BUN 20 23* 24*  --  31*  CREATININE 1.22* 1.02* 0.98  --  1.58*  CALCIUM 8.7* 8.0* 8.0*  --  8.6*  MG  --   --   --  1.4*  --    Liver Function Tests:  Recent Labs Lab 12/26/15 0826  AST 30  ALT 13*  ALKPHOS 58  BILITOT 0.8  PROT 6.1*  ALBUMIN 2.9*    CBC:  Recent Labs Lab 12/26/15 0826 12/27/15 0508 12/28/15 0409 12/29/15 0541  WBC 20.5* 13.9* 11.7* 8.3  NEUTROABS 17.8*  --   --   --   HGB 13.7 11.6* 10.7* 11.3*  HCT 42.1 35.8* 33.7* 35.8*  MCV 88.1 89.9 90.3 89.7  PLT 260 207 207 182   Cardiac Enzymes:  Recent Labs Lab 12/26/15 0826 12/26/15 1608 12/26/15 2131 12/27/15 0213  TROPONINI 3.26* 4.21* 4.09* 4.04*     Recent Results (from the past 240 hour(s))  C difficile quick scan w PCR reflex     Status: Abnormal   Collection Time: 12/26/15  8:17 AM  Result Value Ref Range Status   C Diff antigen POSITIVE (A) NEGATIVE Final   C Diff toxin POSITIVE (A) NEGATIVE Final   C Diff interpretation Positive for toxigenic C. difficile  Final    Comment: CRITICAL RESULT CALLED TO, READ BACK BY AND VERIFIED WITH: BLACKBURN C. AT  [id number] ON [id number] BY THOMPSON S.   Culture, blood (Routine X 2) w Reflex to ID Panel     Status: None (Preliminary result)   Collection Time: 12/26/15  8:43 AM  Result Value Ref Range Status   Specimen Description BLOOD RIGHT HAND  Final   Special Requests BOTTLES DRAWN AEROBIC AND ANAEROBIC 6CC  Final   Culture NO GROWTH 2 DAYS  Final   Report Status PENDING  Incomplete  Culture, blood (Routine X 2) w Reflex to ID Panel     Status: None (Preliminary result)   Collection Time: 12/26/15  9:32 AM  Result Value Ref Range Status   Specimen Description BLOOD LEFT ARM  Final   Special Requests BOTTLES DRAWN AEROBIC ONLY 6CC  Final   Culture  Setup Time   Final    GRAM POSITIVE COCCI IN CLUSTERS AEROBIC BOTTLE ONLY CRITICAL RESULT CALLED TO, READ BACK BY AND VERIFIED WITH: M MCDANIEL,RN AT 1015 12/27/15 BY L BENFIELD  Culture   Final    STAPHYLOCOCCUS SPECIES (COAGULASE NEGATIVE) THE SIGNIFICANCE OF ISOLATING THIS ORGANISM FROM A SINGLE SET OF BLOOD CULTURES WHEN MULTIPLE SETS ARE DRAWN IS UNCERTAIN. PLEASE NOTIFY THE MICROBIOLOGY DEPARTMENT WITHIN ONE WEEK IF SPECIATION AND SENSITIVITIES ARE REQUIRED. Performed at [facility]    Report Status PENDING  Incomplete  MRSA PCR Screening     Status: None   Collection Time: 12/26/15  6:45 PM  Result Value Ref Range Status   MRSA by PCR NEGATIVE NEGATIVE Final    Comment:        The GeneXpert MRSA Assay (FDA approved for NASAL specimens only), is one component of a comprehensive MRSA colonization surveillance program. It is not intended to diagnose MRSA infection nor to guide or monitor treatment for MRSA infections.   Culture, blood (x 2)     Status: None (Preliminary result)   Collection Time: 12/26/15  9:31 PM  Result Value Ref Range Status   Specimen Description LEFT ANTECUBITAL  Final   Special Requests BOTTLES DRAWN AEROBIC ONLY 6CC ONLY  Final   Culture NO GROWTH 2 DAYS  Final   Report Status PENDING  Incomplete   Culture, blood (x 2)     Status: None (Preliminary result)   Collection Time: 12/26/15  9:41 PM  Result Value Ref Range Status   Specimen Description BLOOD LEFT ARM  Final   Special Requests BOTTLES DRAWN AEROBIC AND ANAEROBIC [place]  Final   Culture NO GROWTH 2 DAYS  Final   Report Status PENDING  Incomplete     Studies: No results found.  Scheduled Meds: . aspirin EC  81 mg Oral Daily  . enoxaparin (LOVENOX) injection  40 mg Subcutaneous Q24H  . metoprolol tartrate  25 mg Oral BID  . sertraline  25 mg Oral Daily  . sodium chloride  250 mL Intravenous Once  . vancomycin  125 mg Oral 4 times per day   Continuous Infusions:    Active Problems:   Hyperlipidemia LDL goal <130   C. difficile colitis   Sepsis ([place])   NSTEMI (non-ST elevated myocardial infarction) ([place])   Hypokalemia   CKD (chronic kidney disease) stage 3, GFR 30-59 ml/min   Acute respiratory failure with hypoxia ([place])    Time spent: 25 minutes   Mario Voong. MD  [facility] Pager [phone]. If 7PM-7AM, please contact night-coverage at [email or web address], password [facility] 12/29/2015, 6:51 AM  LOS: 3 days      By signing my name below, I, Rennis Harding, attest that this documentation has been prepared under the direction and in the presence of Kathie Dike, MD. Electronically signed: Rennis Harding, Scribe. 12/29/2015 10:19am  I, Dr. Kathie Dike, personally performed the services described in this documentaiton. All medical record entries made by the scribe were at my direction and in my presence. I have reviewed the chart and agree that the record reflects my personal performance and is accurate and complete  Kathie Dike, MD, 12/29/2015 10:48 AM           v

## 2015-12-30 LAB — BASIC METABOLIC PANEL
Anion gap: 7 (ref 5–15)
BUN: 24 mg/dL — ABNORMAL HIGH (ref 6–20)
CALCIUM: 8.5 mg/dL — AB (ref 8.9–10.3)
CO2: 25 mmol/L (ref 22–32)
Chloride: 106 mmol/L (ref 101–111)
Creatinine, Ser: 1.36 mg/dL — ABNORMAL HIGH (ref 0.44–1.00)
GFR calc Af Amer: 36 mL/min — ABNORMAL LOW (ref >60)
GFR, EST NON AFRICAN AMERICAN: 31 mL/min — AB (ref >60)
GLUCOSE: 100 mg/dL — AB (ref 65–99)
POTASSIUM: 3.6 mmol/L (ref 3.5–5.1)
SODIUM: 138 mmol/L (ref 135–145)

## 2015-12-30 LAB — CBC
HEMATOCRIT: 29.6 % — AB (ref 36.0–46.0)
Hemoglobin: 9.8 g/dL — ABNORMAL LOW (ref 12.0–15.0)
MCH: 29.4 pg (ref 26.0–34.0)
MCHC: 33.1 g/dL (ref 30.0–36.0)
MCV: 88.9 fL (ref 78.0–100.0)
PLATELETS: 217 10*3/uL (ref 150–400)
RBC: 3.33 MIL/uL — ABNORMAL LOW (ref 3.87–5.11)
RDW: 13.7 % (ref 11.5–15.5)
WBC: 6.5 10*3/uL (ref 4.0–10.5)

## 2015-12-30 LAB — MAGNESIUM: MAGNESIUM: 1.8 mg/dL (ref 1.7–2.4)

## 2015-12-30 LAB — TSH: TSH: 1.404 u[IU]/mL (ref 0.350–4.500)

## 2015-12-30 MED ORDER — POTASSIUM CHLORIDE CRYS ER 20 MEQ PO TBCR
20.0000 meq | EXTENDED_RELEASE_TABLET | Freq: Once | ORAL | Status: AC
  Administered 2015-12-30: 20 meq via ORAL
  Filled 2015-12-30: qty 1

## 2015-12-30 MED ORDER — METOPROLOL SUCCINATE ER 25 MG PO TB24
25.0000 mg | ORAL_TABLET | Freq: Every day | ORAL | Status: DC
  Administered 2015-12-31 – 2016-01-01 (×2): 25 mg via ORAL
  Filled 2015-12-30 (×3): qty 1

## 2015-12-30 MED ORDER — MAGNESIUM CHLORIDE 64 MG PO TBEC: 1.0000 | DELAYED_RELEASE_TABLET | Freq: Once | ORAL | Status: DC

## 2015-12-30 MED ORDER — MAGNESIUM OXIDE 400 (241.3 MG) MG PO TABS
400.0000 mg | ORAL_TABLET | Freq: Once | ORAL | Status: AC
Start: 2015-12-30 — End: 2015-12-30
  Administered 2015-12-30: 400 mg via ORAL
  Filled 2015-12-30: qty 1

## 2015-12-30 NOTE — Discharge Summary (Deleted)
error 

## 2015-12-30 NOTE — Progress Notes (Signed)
Patient had 3 large loose BMs this afternoon.

## 2015-12-30 NOTE — Progress Notes (Signed)
PROGRESS NOTE  Elizabeth Oneal [id number] DOB: 1916-06-02 DOA: 12/26/2015 PCP: Vernell Morgans, MD  Summary: 41 yof with PMH of HTN. C. Diff, and HLD presented from SNF for complaints of SOB. Per son arrival she has been treated with IV Vancomycin and Zosyn for the last few weeks for PNA and required supplemental O2. She also was noted to have chronic cough with intermittent production and multiple bowel movements. In the ED she was found to be febrile with temp of 102.9, C. Diff positive, and troponin 3.2 with non-acute EKG. She was admitted for sepsis and elevated troponin.   Assessment/Plan: 1. C. difficile colitis. Hemodynamics stable. No diarrhea much improved. Secondary to recent treatment for pneumonia with broad-spectrum antibiotics. Continue vancomycin. On oral budesonide prior to admission for unclear reasons. Gastroenterology recommended discontinuing this. 2. Sepsis secondary to C. difficile colitis. Resolved. 3. Acute hypoxic respiratory failure. Resolved. 4. NSTEMI. Stable. Management per cardiology. Change to Toprol-XL tomorrow. Start low-dose lisinopril tomorrow if renal function improved. Consider repeat echocardiogram in 3 months. Follow-up with cardiology in 2 weeks after discharge. 5. New systolic CHF  compensated. Likely ischemic versus Takotsubo CM. 6. New onset atrial fibrillation with rapid ventricular response. Currently in sinus rhythm. Transient, cardiology recommended against anticoagulation at this time, thought to be related to sepsis. 7. Acute kidney injury superimposed on chronic kidney disease stage III. Secondary sepsis. Resolving. 8. Anemia. Stable. Suspect chronic disease anemia. Follow-up as an outpatient.   Overall continuing to improve. Continue oral vancomycin. Continue cardiac manipulations per cardiology. Anticipate transfer skilled nursing facility 2/16.  BMP in AM. Start ACE inhibitor if creatinine improved.  Code Status: DNR DVT prophylaxis:  Lovenox Family Communication: No family at bedside Disposition Plan: Likely discharge to SNF  Murray Hodgkins, MD  [facility]  Pager [phone] If 7PM-7AM, please contact night-coverage at [email or web address], password [facility] 12/30/2015, 10:52 AM  LOS: 4 days   Consultants:  Cardiology   Procedures:  ECHO Study Conclusions - Left ventricle: The cavity size was normal. Systolic function was moderately to severely reduced. The estimated ejection fraction was in the range of 30% to 35%. Dyskinesis of the apical myocardium. Severe hypokinesis of the mid-apicalanteroseptal, anterior, inferior, and inferoseptal myocardium; consistent with infarction in the distribution of the left anterior descending coronary artery. There was fusion of early and atrial contributions to ventricular filling. The study is not technically sufficient to allow evaluation of LV diastolic function.  Antibiotics:  Vancomycin 2/11>>  HPI/Subjective: Patient reports that she feels a lot better today. Denies pain, NV. Breathing is okay and there is no CP. Has appetite. Diarrhea has improved, but is unsure of specific amount. Admits to a cough for the last few days.  Objective: Filed Vitals:   12/29/15 1500 12/29/15 1600 12/29/15 1700 12/30/15 0606  BP: 132/54 151/59 159/89 141/51  Pulse: 88 92 99 105  Temp:    98.1 F (36.7 C)  TempSrc:    Oral  Resp: 26 26 20 20   Height:      Weight:      SpO2: 99% 98% 97% 100%    Intake/Output Summary (Last 24 hours) at 12/30/15 1052 Last data filed at 12/30/15 0607  Gross per 24 hour  Intake   1675 ml  Output      0 ml  Net   1675 ml     Filed Weights   12/26/15 0746 12/28/15 0500 12/29/15 0500  Weight: 72.576 kg (160 lb) 72.8 kg (160 lb 7.9 oz) 72.7 kg (160 lb 4.4  oz)    Exam:     Afebrile, on [place],  General:  Appears calm and comfortable Cardiovascular: RRR, no m/r/g. 1+ LE edema. Telemetry: SR Respiratory: CTA bilaterally, no  w/r/r. Normal respiratory effort. Abdomen: soft, ntnd Musculoskeletal: grossly normal tone BUE/BLE Psychiatric: grossly normal mood and affect, speech fluent and appropriate Neurologic: grossly non-focal.  New data reviewed:  BUN 24/ creatinine 1.36. Trending down  Hgb 9.8, stable  TSH wnl  CBG stable  Magnesium and potassium wnl  Cardiology recommendations  Pertinent data since admission:  C. Dif positive.   Pending data:  None  Scheduled Meds: . aspirin EC  81 mg Oral Daily  . clopidogrel  75 mg Oral Daily  . enoxaparin (LOVENOX) injection  30 mg Subcutaneous Q24H  . magnesium chloride  1 tablet Oral Once  . metoprolol tartrate  25 mg Oral BID  . potassium chloride  20 mEq Oral Once  . sertraline  25 mg Oral Daily  . sodium chloride  250 mL Intravenous Once  . vancomycin  125 mg Oral 4 times per day   Continuous Infusions: . sodium chloride 75 mL/hr at 12/29/15 2225    Active Problems:   Hyperlipidemia LDL goal <130   C. difficile colitis   Sepsis ([place])   NSTEMI (non-ST elevated myocardial infarction) ([place])   Hypokalemia   CKD (chronic kidney disease) stage 3, GFR 30-59 ml/min   Acute respiratory failure with hypoxia ([facility])   Paroxysmal atrial fibrillation ([place])   Time spent 25 minutes   By signing my name below, I, Delene Ruffini, attest that this documentation has been prepared under the direction and in the presence of Tyshia Fenter P. Sarajane Jews, MD. Electronically Signed: Delene Ruffini, Scribe. 12/30/2015 11:43 am    I personally performed the services described in this documentation. All medical record entries made by the scribe were at my direction. I have reviewed the chart and agree that the record reflects my personal performance and is accurate and complete. Murray Hodgkins, MD

## 2015-12-30 NOTE — Progress Notes (Signed)
Subjective:  Feeling better. No chest pain or breathing problems today  Objective:  Vital Signs in the last 24 hours: Temp:  [98.1 F (36.7 C)] 98.1 F (36.7 C) (02/15 0606) Pulse Rate:  [79-105] 105 (02/15 0606) Resp:  [18-27] 20 (02/15 0606) BP: (116-159)/(44-89) 141/51 mmHg (02/15 0606) SpO2:  [97 %-100 %] 100 % (02/15 0606)  Intake/Output from previous day: 02/14 0701 - 02/15 0700 In: 1675 [P.O.:240; I.V.:1435] Out: -  Intake/Output from this shift:    Physical Exam: NECK: Without JVD, HJR, or bruit LUNGS: Decreased breath sound with crackles at bases HEART: Regular rate and rhythm, no murmur, gallop, rub, bruit, thrill, or heave EXTREMITIES: trace ankle edema bilaterally Without cyanosis, clubbing  Lab Results:  Recent Labs  12/29/15 0541 12/30/15 0540  WBC 8.3 6.5  HGB 11.3* 9.8*  PLT 182 217    Recent Labs  12/29/15 0545 12/30/15 0540  NA 141 138  K 4.1 3.6  CL 112* 106  CO2 20* 25  GLUCOSE 117* 100*  BUN 31* 24*  CREATININE 1.58* 1.36*   No results for input(s): TROPONINI in the last 72 hours.  Invalid input(s): CK, MB Hepatic Function Panel No results for input(s): PROT, ALBUMIN, AST, ALT, ALKPHOS, BILITOT, BILIDIR, IBILI in the last 72 hours. No results for input(s): CHOL in the last 72 hours. No results for input(s): PROTIME in the last 72 hours.   Cardiac Studies: Echocardiogram 12/27/2015 Left ventricle: The cavity size was normal. Systolic function was   moderately to severely reduced. The estimated ejection fraction   was in the range of 30% to 35%. Dyskinesis of the apical   myocardium. Severe hypokinesis of the mid-apicalanteroseptal,   anterior, inferior, and inferoseptal myocardium; consistent with   infarction in the distribution of the left anterior descending   coronary artery. There was fusion of early and atrial   contributions to ventricular filling. The study is not   technically sufficient to allow evaluation of LV  diastolic   function.  Assessment/Plan:  1. NSTEMI: Troponin peak 4.21 . She denies chest pain or dyspnea. Plavix added yesterday. She was placed on BB and ASA. No planned invasive procedures due to co-morbidities and age. Continue medical management. This has been discussed with the family by Dr. Domenic Polite. Will give potassium and magnesium to keep above 4 and 2.  2. Paroxysmal Atrial fib: Now in SR with incomplete LBBB. Plavix added in the setting of NSTEMI  3.  Systolic Dysfunction:Ef [id number]  Likely ischemic cardiomyopathy vs Takotsubo CM. Currently on IV fluids with Cdiff   4. Acute on chronic renal insufficiency: crt 1.36 today,improving.  LOS: 4 days    Ermalinda Barrios 12/30/2015, 10:20 AM Attending note  Patient seen and discussed with PA Bonnell Public, I agree with her documentation. Medically managed NSTEMI in 82 yo female with multiple medical comorbidities and DNR status with acute cdiff infection. No plans for cath. Transient afib episode in setting of cdiff sepsis, currently maintaining NSR. Have not committed to anticoag due to transient nature of afib and likely indentifiable temporary cause given her sepsis. New diagnosis of systolic heart failure, consider ICM vs Takotsubo. We will change lopressor to Toprol XL starting with tomorrows dose, if renal function continues to improve would start lisinopril 2.5 mg daily now that bp more stable.  Titrate meds over follow ups, would consider repeat echo in 3 months, if a stress induced CM likely should resolved.   We will sign off inpatient care, she will need f/u in  2 weeks with cardiology after discharge.  Zandra Abts MD

## 2015-12-31 ENCOUNTER — Inpatient Hospital Stay (HOSPITAL_COMMUNITY): Payer: Medicare Other

## 2015-12-31 DIAGNOSIS — N183 Chronic kidney disease, stage 3 (moderate): Secondary | ICD-10-CM

## 2015-12-31 LAB — BASIC METABOLIC PANEL
Anion gap: 9 (ref 5–15)
BUN: 21 mg/dL — AB (ref 6–20)
CHLORIDE: 105 mmol/L (ref 101–111)
CO2: 20 mmol/L — AB (ref 22–32)
CREATININE: 1.24 mg/dL — AB (ref 0.44–1.00)
Calcium: 8.3 mg/dL — ABNORMAL LOW (ref 8.9–10.3)
GFR calc Af Amer: 41 mL/min — ABNORMAL LOW (ref >60)
GFR calc non Af Amer: 35 mL/min — ABNORMAL LOW (ref >60)
GLUCOSE: 100 mg/dL — AB (ref 65–99)
POTASSIUM: 3.7 mmol/L (ref 3.5–5.1)
SODIUM: 134 mmol/L — AB (ref 135–145)

## 2015-12-31 LAB — CULTURE, BLOOD (ROUTINE X 2)
Culture: NO GROWTH
Culture: NO GROWTH
Culture: NO GROWTH

## 2015-12-31 MED ORDER — LISINOPRIL 5 MG PO TABS
2.5000 mg | ORAL_TABLET | Freq: Every day | ORAL | Status: DC
  Administered 2015-12-31 – 2016-01-01 (×2): 2.5 mg via ORAL
  Filled 2015-12-31 (×2): qty 1

## 2015-12-31 MED ORDER — GUAIFENESIN ER 600 MG PO TB12
1200.0000 mg | ORAL_TABLET | Freq: Two times a day (BID) | ORAL | Status: DC
  Administered 2015-12-31 – 2016-01-01 (×2): 1200 mg via ORAL
  Filled 2015-12-31 (×2): qty 2

## 2015-12-31 MED ORDER — FUROSEMIDE 20 MG PO TABS
20.0000 mg | ORAL_TABLET | Freq: Every day | ORAL | Status: DC
  Administered 2015-12-31 – 2016-01-01 (×2): 20 mg via ORAL
  Filled 2015-12-31 (×2): qty 1

## 2015-12-31 NOTE — Discharge Summary (Signed)
Physician Discharge Summary  Elizabeth Oneal [id number] DOB: 1916-03-10 DOA: 12/26/2015  PCP: Vernell Morgans, MD  Admit date: 12/26/2015 Discharge date: 12/31/2015  Recommendations for Outpatient Follow-up:  1. Follow up BMP in 1 week, follow-up Lasix therapy, monitor daily weights and adjust diuretics accordingly. 2. Consider repeat ECHO in 3 months.    Follow-up Information    Follow up with ARIZA,FERNANDO, MD.   Specialty:  Internal Medicine   Why:  after rehab   Contact information:   [address] [place] [address] [phone]       Follow up with Ermalinda Barrios, PA-C On 01/13/2016.   Specialty:  Cardiology   Why:  at 11:40 am   Contact information:   [place] [address] [phone]        Discharge Diagnoses:  1. C. diff colitis with sepsis 2. Acute hypoxic respiratory failure secondary to sepsis 3. NSTEMI 4. Paroxysmal a-fib 5. New systolic CHF, compensated 6. AKI superimposed on CKD stage III 7. Anemia  Discharge Condition: Improved Disposition: SNF  Diet recommendation: Heart healthy  Filed Weights   12/26/15 0746 12/28/15 0500 12/29/15 0500  Weight: 72.576 kg (160 lb) 72.8 kg (160 lb 7.9 oz) 72.7 kg (160 lb 4.4 oz)    History of present illness:  38 yof with PMH of HTN. C. Diff, and HLD presented from SNF for complaints of SOB. Per son arrival she has been treated with IV Vancomycin and Zosyn for the last few weeks for PNA and required supplemental O2. She also was noted to have chronic cough with intermittent production and multiple bowel movements. In the ED she was found to be febrile with temp of 102.9, C. Diff positive, and troponin 3.2 with non-acute EKG. She was admitted for sepsis and elevated troponin.   Hospital Course:  Patient presented with persistent diarrhea, s/p recent treatment for PNA with IV Vancomycin and Zosyn. Stool samples were positive for c.diff and she was found to be septic. She was placed on oral  Vancomycin with significant improvement. Per GI, she will need a 6 week taper of this. Her diarrhea has resolved and she denies any abdominal pain, nausea, or vomiting. On admission, patient also complained of SOB and was noted to have an O2 sat of 84% on RA, likely related to her newly diagnosed systolic CHF, sepsis and NSTEMI. She was placed on supplemental O2 and IV Lasix with significant improvement. She is now breathing comfortably on RA with stable vital signs. Initial labs revealed a troponin of 3.2. Cardiology was consulted and recommended medical management given her age and comorbidities. She has been started on Toprol XL and low-dose lisinopril which will be continued upon discharge. Her troponin has trended down. She will follow up with cardiology in 2 weeks.  Individual issues as below:  1. C. difficile colitis. Afebrile, vital signs stable. Overall continues to improve. Continue oral vancomycin with taper as recommended by gastroenterology. Gastroenterology did recommend discontinuing oral budesonide. 2. Sepsis secondary to C. difficile colitis, resolved. 3. Acute hypoxic respiratory failure secondary to sepsis, resolved. 4. NSTEMI. Remains stable. Per cardiology continue Toprol-XL, lisinopril. Repeat echocardiogram in 3 months. Follow-up with cardiology in 2 weeks. No statin given Lipitor allergy as per cardiology. 5. New systolic CHF, compensated. Lower extremity edema improved. Likely ischemic versus Takotsubo CM.  6. Paroxysmal atrial fibrillation with rapid ventricular response, transient. In sinus rhythm. Cardiology recommended against anticoagulation at this time, thought to be related to sepsis. 7. Acute kidney injury superimposed on  chronic kidney disease stage III. Secondary to sepsis.a. Stable. Suspect chronic disease anemia. Follow-up as an outpatient.  Consultants:  Cardiology  Procedures:  ECHO Study Conclusions - Left ventricle: The cavity size was normal. Systolic  function was moderately to severely reduced. The estimated ejection fraction was in the range of 30% to 35%. Dyskinesis of the apical myocardium. Severe hypokinesis of the mid-apicalanteroseptal, anterior, inferior, and inferoseptal myocardium; consistent with infarction in the distribution of the left anterior descending coronary artery. There was fusion of early and atrial contributions to ventricular filling. The study is not technically sufficient to allow evaluation of LV diastolic function.  Antibiotics:  Vancomycin 2/11>>  Discharge Instructions     Current Discharge Medication List    START taking these medications   Details  aspirin EC 81 MG EC tablet Take 1 tablet (81 mg total) by mouth daily.    clopidogrel (PLAVIX) 75 MG tablet Take 1 tablet (75 mg total) by mouth daily.    furosemide (LASIX) 20 MG tablet Take 1 tablet (20 mg total) by mouth daily.    lisinopril (PRINIVIL,ZESTRIL) 2.5 MG tablet Take 1 tablet (2.5 mg total) by mouth daily.    metoprolol succinate (TOPROL-XL) 25 MG 24 hr tablet Take 1 tablet (25 mg total) by mouth daily.    nitroGLYCERIN (NITROSTAT) 0.4 MG SL tablet Place 1 tablet (0.4 mg total) under the tongue every 5 (five) minutes x 3 doses as needed for chest pain.    vancomycin (VANCOCIN) 50 mg/mL oral solution 125 mg PO QID x 8 days, then 125 mg PO BID x7 days, then 125 mg PO daily x7 days, then 125 mg PO every other day x 7days, then 125 mg PO every 3 days, then stop.      CONTINUE these medications which have CHANGED   Details  acetaminophen (TYLENOL) 325 MG tablet Take 2 tablets (650 mg total) by mouth every 4 (four) hours as needed for headache or mild pain.      CONTINUE these medications which have NOT CHANGED   Details  Melatonin 3 MG TABS Take 1 tablet by mouth at bedtime.    Multiple Vitamin (MULTIVITAMIN) tablet Take 1 tablet by mouth daily.    Probiotic CAPS Take 1 capsule by mouth daily.      senna-docusate (SENOKOT-S) 8.6-50 MG tablet Take 1 tablet by mouth daily.    sertraline (ZOLOFT) 25 MG tablet Take 25 mg by mouth daily.      STOP taking these medications     amLODipine (NORVASC) 5 MG tablet      budesonide (ENTOCORT EC) 3 MG 24 hr capsule      loperamide (IMODIUM) 2 MG capsule      magnesium oxide (MAG-OX) 400 MG tablet      metoprolol tartrate (LOPRESSOR) 25 MG tablet        Allergies  Allergen Reactions  . Celebrex [Celecoxib]   . Codeine Other (See Comments)    Goes out of right state of mind.   . Lipitor [Atorvastatin]   . Morphine And Related   . Prozac [Fluoxetine Hcl]   . Vioxx [Rofecoxib]   . Xanax [Alprazolam] Other (See Comments)    Goes out of right state of mind.     The results of significant diagnostics from this hospitalization (including imaging, microbiology, ancillary and laboratory) are listed below for reference.    Significant Diagnostic Studies: Dg Chest 2 View  12/26/2015  CLINICAL DATA:  Shortness of breath. EXAM: CHEST  2 VIEW  COMPARISON:  Earlier today FINDINGS: Right upper extremity PICC with tip near the SVC origin. Cardiomegaly which is likely accentuated by rotation. Calcified mediastinal lymph nodes. Low lung volumes with bilateral atelectasis and small left effusion. No definite focal consolidation. Limited lateral view due to positioning. IMPRESSION: Low volumes with bilateral atelectasis and small left effusion. No consolidating pneumonia is suspected, but consider follow-up. Electronically Signed   By: Monte Fantasia M.D.   On: 12/26/2015 09:35   Dg Chest Port 1 View  12/26/2015  CLINICAL DATA:  Hypoxemia.  Recent history of pneumonia. EXAM: PORTABLE CHEST 1 VIEW COMPARISON:  09/16/2015; 07/30/2012 FINDINGS: Grossly unchanged enlarged cardiac silhouette and mediastinal contours given persistently reduced lung volumes and patient rotation. Atherosclerotic plaque within the abdominal aorta. Partially calcified right hilar  lymph nodes, unchanged. Interval placement of right upper extremity approach PICC line with tip projecting over the central aspect of the right innominate vein. Lung volumes remain reduced with grossly unchanged bibasilar heterogeneous opacities. No pleural effusion. No evidence of edema. No acute osseous abnormalities. IMPRESSION: 1. Persistently reduced lung volumes with associated bibasilar opacities, atelectasis versus infiltrate. No new focal airspace opacities. Further evaluation with a PA and lateral chest radiograph may be obtained as clinically indicated. 2. Interval placement of right upper extremity approach PICC line with tip projected over the central aspect of the right innominate vein. Electronically Signed   By: Sandi Mariscal M.D.   On: 12/26/2015 08:31    Microbiology: Recent Results (from the past 240 hour(s))  C difficile quick scan w PCR reflex     Status: Abnormal   Collection Time: 12/26/15  8:17 AM  Result Value Ref Range Status   C Diff antigen POSITIVE (A) NEGATIVE Final   C Diff toxin POSITIVE (A) NEGATIVE Final   C Diff interpretation Positive for toxigenic C. difficile  Final    Comment: CRITICAL RESULT CALLED TO, READ BACK BY AND VERIFIED WITH: BLACKBURN C. AT 0940A ON [id number] BY THOMPSON S.   Culture, blood (Routine X 2) w Reflex to ID Panel     Status: None (Preliminary result)   Collection Time: 12/26/15  8:43 AM  Result Value Ref Range Status   Specimen Description BLOOD RIGHT HAND  Final   Special Requests BOTTLES DRAWN AEROBIC AND ANAEROBIC 6CC  Final   Culture NO GROWTH 4 DAYS  Final   Report Status PENDING  Incomplete  Culture, blood (Routine X 2) w Reflex to ID Panel     Status: None   Collection Time: 12/26/15  9:32 AM  Result Value Ref Range Status   Specimen Description BLOOD LEFT ARM  Final   Special Requests BOTTLES DRAWN AEROBIC ONLY 6CC  Final   Culture  Setup Time   Final    GRAM POSITIVE COCCI IN CLUSTERS AEROBIC BOTTLE ONLY CRITICAL RESULT  CALLED TO, READ BACK BY AND VERIFIED WITH: M MCDANIEL,RN AT 1015 12/27/15 BY L BENFIELD    Culture   Final    STAPHYLOCOCCUS SPECIES (COAGULASE NEGATIVE) THE SIGNIFICANCE OF ISOLATING THIS ORGANISM FROM A SINGLE SET OF BLOOD CULTURES WHEN MULTIPLE SETS ARE DRAWN IS UNCERTAIN. PLEASE NOTIFY THE MICROBIOLOGY DEPARTMENT WITHIN ONE WEEK IF SPECIATION AND SENSITIVITIES ARE REQUIRED. Performed at [facility]    Report Status 12/29/2015 FINAL  Final  MRSA PCR Screening     Status: None   Collection Time: 12/26/15  6:45 PM  Result Value Ref Range Status   MRSA by PCR NEGATIVE NEGATIVE Final    Comment:  The GeneXpert MRSA Assay (FDA approved for NASAL specimens only), is one component of a comprehensive MRSA colonization surveillance program. It is not intended to diagnose MRSA infection nor to guide or monitor treatment for MRSA infections.   Culture, blood (x 2)     Status: None (Preliminary result)   Collection Time: 12/26/15  9:31 PM  Result Value Ref Range Status   Specimen Description BLOOD LEFT ANTECUBITAL  Final   Special Requests BOTTLES DRAWN AEROBIC ONLY 6CC ONLY  Final   Culture NO GROWTH 4 DAYS  Final   Report Status PENDING  Incomplete  Culture, blood (x 2)     Status: None (Preliminary result)   Collection Time: 12/26/15  9:41 PM  Result Value Ref Range Status   Specimen Description BLOOD LEFT ARM  Final   Special Requests BOTTLES DRAWN AEROBIC AND ANAEROBIC [place]  Final   Culture NO GROWTH 4 DAYS  Final   Report Status PENDING  Incomplete     Labs: Basic Metabolic Panel:  Recent Labs Lab 12/27/15 0954 12/28/15 0409 12/28/15 0634 12/29/15 0545 12/30/15 0540 12/31/15 0548  NA 137 138  --  141 138 134*  K 3.4* 3.4*  --  4.1 3.6 3.7  CL 104 106  --  112* 106 105  CO2 25 22  --  20* 25 20*  GLUCOSE 99 130*  --  117* 100* 100*  BUN 23* 24*  --  31* 24* 21*  CREATININE 1.02* 0.98  --  1.58* 1.36* 1.24*  CALCIUM 8.0* 8.0*  --  8.6* 8.5* 8.3*    MG  --   --  1.4*  --  1.8  --    Liver Function Tests:  Recent Labs Lab 12/26/15 0826  AST 30  ALT 13*  ALKPHOS 58  BILITOT 0.8  PROT 6.1*  ALBUMIN 2.9*   CBC:  Recent Labs Lab 12/26/15 0826 12/27/15 0508 12/28/15 0409 12/29/15 0541 12/30/15 0540  WBC 20.5* 13.9* 11.7* 8.3 6.5  NEUTROABS 17.8*  --   --   --   --   HGB 13.7 11.6* 10.7* 11.3* 9.8*  HCT 42.1 35.8* 33.7* 35.8* 29.6*  MCV 88.1 89.9 90.3 89.7 88.9  PLT 260 207 207 182 217   Cardiac Enzymes:  Recent Labs Lab 12/26/15 0826 12/26/15 1608 12/26/15 2131 12/27/15 0213  TROPONINI 3.26* 4.21* 4.09* 4.04*     Principal Problem:   C. difficile colitis Active Problems:   Hyperlipidemia LDL goal <130   Sepsis (HCC)   NSTEMI (non-ST elevated myocardial infarction) (HCC)   Hypokalemia   CKD (chronic kidney disease) stage 3, GFR 30-59 ml/min   Acute respiratory failure with hypoxia (HCC)   Paroxysmal atrial fibrillation ([place])   Time coordinating discharge: 35 minutes  Signed:  Murray Hodgkins, MD [facility] 12/31/2015, 7:24 AM   By signing my name below, I, Rosalie Doctor attest that this documentation has been prepared under the direction and in the presence of Murray Hodgkins, MD Electronically signed: Rosalie Doctor, Scribe.  12/31/2015   I personally performed the services described in this documentation. All medical record entries made by the scribe were at my direction. I have reviewed the chart and agree that the record reflects my personal performance and is accurate and complete. Murray Hodgkins, MD

## 2015-12-31 NOTE — Care Management Note (Signed)
Case Management Note  Patient Details  Name: Elizabeth Oneal MRN: [id number] Date of Birth: Aug 27, 1916  Expected Discharge Date:                  Expected Discharge Plan:  [place]  In-House Referral:  Clinical Social Work  Discharge planning Services  CM Consult  Post Acute Care Choice:  NA Choice offered to:  NA  DME Arranged:    DME Agency:     HH Arranged:    [place] Agency:     Status of Service:  Completed, signed off  Medicare Important Message Given:  Yes Date Medicare IM Given:    Medicare IM give by:    Date Additional Medicare IM Given:    Additional Medicare Important Message give by:     If discussed at [place] of Stay Meetings, dates discussed:  12/31/2015  Additional Comments: Anticipate return to SNF on 01/01/2016.  Sherald Barge, RN 12/31/2015, 2:41 PM

## 2015-12-31 NOTE — Care Management Important Message (Signed)
Important Message  Patient Details  Name: KYAN THIELEMANN MRN: [id number] Date of Birth: 05-Dec-1915   Medicare Important Message Given:  Yes    Sherald Barge, RN 12/31/2015, 2:41 PM

## 2015-12-31 NOTE — NC FL2 (Signed)
[place] MEDICAID FL2 LEVEL OF CARE SCREENING TOOL     IDENTIFICATION  Patient Name: Elizabeth Oneal Birthdate: Oct 31, 1916 Sex: female Admission Date (Current Location): 12/26/2015  [facility] and [place] Number:  [employer] and Address:  [place] [address] [place]      Provider Number: [id number]  Attending Physician Name and Address:  Samuella Cota, MD  Relative Name and Phone Number:       Current Level of Care: Hospital Recommended Level of Care: [place] Prior Approval Number:    Date Approved/Denied:   PASRR Number:    Discharge Plan: SNF    Current Diagnoses: Patient Active Problem List   Diagnosis Date Noted  . Paroxysmal atrial fibrillation ([place]) 12/29/2015  . C. difficile colitis 12/26/2015  . Sepsis ([place]) 12/26/2015  . NSTEMI (non-ST elevated myocardial infarction) ([place]) 12/26/2015  . Hypokalemia 12/26/2015  . CKD (chronic kidney disease) stage 3, GFR 30-59 ml/min 12/26/2015  . Acute respiratory failure with hypoxia ([place]) 12/26/2015  . History of depression 03/22/2015  . Frailty 03/22/2015  . PSEUDOMEMBRANOUS COLITIS 02/03/2009  . Hyperlipidemia LDL goal <130 02/03/2009  . Essential hypertension 02/03/2009  . SCHATZKI'S RING 02/03/2009  . GERD 02/03/2009  . COLITIS 02/03/2009  . DYSPHAGIA UNSPECIFIED 02/03/2009  . DIARRHEA 02/03/2009  . DIVERTICULITIS, HX OF 02/03/2009    Orientation RESPIRATION BLADDER Height & Weight     Self, Time, Situation, Place   (2L) Incontinent Weight: 160 lb 4.4 oz (72.7 kg) Height:  5\' 6"  (167.6 cm)  BEHAVIORAL SYMPTOMS/MOOD NEUROLOGICAL BOWEL NUTRITION STATUS      Incontinent  (regular diet)  AMBULATORY STATUS COMMUNICATION OF NEEDS Skin   Limited Assist Verbally Normal                       Personal Care Assistance Level of Assistance  Bathing, Dressing Bathing Assistance: Limited assistance   Dressing Assistance: Limited assistance      Functional Limitations Info             SPECIAL CARE FACTORS FREQUENCY  PT (By licensed PT), OT (By licensed OT)     PT Frequency: 5 OT Frequency: 5            Contractures      Additional Factors Info  Code Status Code Status Info: NKDA             Current Medications (12/31/2015):  This is the current hospital active medication list Current Facility-Administered Medications  Medication Dose Route Frequency Provider Last Rate Last Dose  . acetaminophen (TYLENOL) tablet 650 mg  650 mg Oral Q4H PRN Kathie Dike, MD      . aspirin EC tablet 81 mg  81 mg Oral Daily Kathie Dike, MD   81 mg at 12/30/15 0908  . clopidogrel (PLAVIX) tablet 75 mg  75 mg Oral Daily Arnoldo Lenis, MD   75 mg at 12/30/15 0908  . enoxaparin (LOVENOX) injection 30 mg  30 mg Subcutaneous Q24H Kathie Dike, MD   30 mg at 12/30/15 1641  . metoprolol succinate (TOPROL-XL) 24 hr tablet 25 mg  25 mg Oral Daily Arnoldo Lenis, MD      . nitroGLYCERIN (NITROSTAT) SL tablet 0.4 mg  0.4 mg Sublingual Q5 Min x 3 PRN Kathie Dike, MD      . ondansetron (ZOFRAN) injection 4 mg  4 mg Intravenous Q6H PRN Kathie Dike, MD   4 mg at  12/26/15 1439  . sertraline (ZOLOFT) tablet 25 mg  25 mg Oral Daily Kathie Dike, MD   25 mg at 12/30/15 0908  . sodium chloride 0.9 % bolus 250 mL  250 mL Intravenous Once Oswald Hillock, MD      . vancomycin (VANCOCIN) 50 mg/mL oral solution 125 mg  125 mg Oral 4 times per day Kathie Dike, MD   125 mg at 12/30/15 1640     Discharge Medications: Please see discharge summary for a list of discharge medications.  Relevant Imaging Results:  Relevant Lab Results:   Additional Information SSN [id number]  Ludwig Clarks, LCSW

## 2015-12-31 NOTE — Progress Notes (Signed)
Patient appears more confused today.  Combative with staff, refused breakfast and meds this AM.  Wants [place] to leave her alone whenever in room attempting to provide care.  Has had one small BM this morning.

## 2015-12-31 NOTE — Progress Notes (Addendum)
Patient continues to refuse meds or food.  Encouraged multiple times.  Also will not cooperate with mouth care. MD made aware, as patient has not taken any meds today.  Patient has very flat affect at this time, which is much different than yesterday afternoon.

## 2015-12-31 NOTE — Progress Notes (Signed)
PROGRESS NOTE  Elizabeth Oneal [id number] DOB: February 07, 1916 DOA: 12/26/2015 PCP: Vernell Morgans, MD  Summary: 25 yof with PMH of HTN. C. Diff, and HLD presented from SNF for complaints of SOB. Per son arrival she has been treated with IV Vancomycin and Zosyn for the last few weeks for PNA and required supplemental O2. She also was noted to have chronic cough with intermittent production and multiple bowel movements. In the ED she was found to be febrile with temp of 102.9, C. Diff positive, and troponin 3.2 with non-acute EKG. She was admitted for sepsis and elevated troponin.   Assessment/Plan: 1. C. difficile colitis. Continues to improve. Secondary to recent treatment for pneumonia with broad-spectrum antibiotics. Continue oral vancomycin. On oral budesonide prior to admission for unclear reasons. Gastroenterology recommended discontinuing this. 2. Sepsis secondary to C. difficile colitis. Resolved. 3. Acute hypoxic respiratory failure. Resolved. 4. NSTEMI. Stable. Management per cardiology. Toprol-XL; lisinopril added today. Consider repeat echocardiogram in 3 months. Follow-up with cardiology in 2 weeks after discharge, medical management recommended. Hold off on statin in light of Lipitor allergy. 5. New systolic CHF, compensated. Likely ischemic versus Takotsubo CM. Will start on low dose Lasix today.  6. Paroxysmal atrial fibrillation with rapid ventricular response. Currently in sinus rhythm. Transient, cardiology recommended against anticoagulation at this time, thought to be related to sepsis. 7. Acute kidney injury superimposed on chronic kidney disease stage III. Secondary sepsis. Resolving. 8. Anemia. Stable. Suspect chronic disease anemia. Follow-up as an outpatient.   A bit more confused today. Will hold off on discharge and continue to monitor.   If she continues to improve, anticipate transfer to skilled nursing facility tomorrow.  Continue oral vancomycin.   Code Status:  DNR DVT prophylaxis: Lovenox Family Communication: No family at bedside Disposition Plan: Discharge to SNF today  Murray Hodgkins, MD  [facility]  Pager [phone] If 7PM-7AM, please contact night-coverage at [email or web address], password [facility] 12/31/2015, 7:21 AM  LOS: 5 days   Consultants:  Cardiology   Procedures:  ECHO Study Conclusions - Left ventricle: The cavity size was normal. Systolic function was moderately to severely reduced. The estimated ejection fraction was in the range of 30% to 35%. Dyskinesis of the apical myocardium. Severe hypokinesis of the mid-apicalanteroseptal, anterior, inferior, and inferoseptal myocardium; consistent with infarction in the distribution of the left anterior descending coronary artery. There was fusion of early and atrial contributions to ventricular filling. The study is not technically sufficient to allow evaluation of LV diastolic function.  Antibiotics:  Vancomycin 2/11>>  HPI/Subjective: Denies any pain. Diarrhea is improved. Hx of limited secondary to patient's confusion today.   Objective: Filed Vitals:   12/30/15 0606 12/30/15 1500 12/30/15 1548 12/30/15 2258  BP: 141/51 130/51  107/56  Pulse: 105 97  102  Temp: 98.1 F (36.7 C) 98.4 F (36.9 C)  99.2 F (37.3 C)  TempSrc: Oral Oral  Axillary  Resp: 20 20  20   Height:      Weight:      SpO2: 100% 100% 97% 99%   No intake or output data in the 24 hours ending 12/31/15 0721   Filed Weights   12/26/15 0746 12/28/15 0500 12/29/15 0500  Weight: 72.576 kg (160 lb) 72.8 kg (160 lb 7.9 oz) 72.7 kg (160 lb 4.4 oz)    Exam:     Afebrile General:  Appears comfortable, calm. Cardiovascular: Regular rate and rhythm, no murmur, rub or gallop. 1-2+ bilateral lower extremity edema. Respiratory: Coarse upper airway noise. No wheezes,  rales or rhonchi. Normal respiratory effort. Abdomen: soft, ntnd Musculoskeletal: grossly normal tone bilateral upper  and lower extremities Psychiatric: Appears to be confused. Answers simple questions, follows simple commands. Neurologic: grossly non-focal.  New data reviewed:  BUN 21/ creatinine 1.24. Trending down.  One small bowel movement today.  Pertinent data since admission:  C. Diff positive.   Pending data:  None  Scheduled Meds: . aspirin EC  81 mg Oral Daily  . clopidogrel  75 mg Oral Daily  . enoxaparin (LOVENOX) injection  30 mg Subcutaneous Q24H  . metoprolol succinate  25 mg Oral Daily  . metoprolol tartrate  25 mg Oral BID  . sertraline  25 mg Oral Daily  . sodium chloride  250 mL Intravenous Once  . vancomycin  125 mg Oral 4 times per day   Continuous Infusions: . sodium chloride 75 mL/hr at 12/30/15 1142    Principal Problem:   C. difficile colitis Active Problems:   Hyperlipidemia LDL goal <130   Sepsis ([place])   NSTEMI (non-ST elevated myocardial infarction) ([place])   Hypokalemia   CKD (chronic kidney disease) stage 3, GFR 30-59 ml/min   Acute respiratory failure with hypoxia (HCC)   Paroxysmal atrial fibrillation ([place])    By signing my name below, I, Rosalie Doctor attest that this documentation has been prepared under the direction and in the presence of Murray Hodgkins, MD Electronically signed: Rosalie Doctor, Scribe. 12/31/2015   I personally performed the services described in this documentation. All medical record entries made by the scribe were at my direction. I have reviewed the chart and agree that the record reflects my personal performance and is accurate and complete. Murray Hodgkins, MD

## 2016-01-01 DIAGNOSIS — E785 Hyperlipidemia, unspecified: Secondary | ICD-10-CM

## 2016-01-01 MED ORDER — NITROGLYCERIN 0.4 MG SL SUBL: 0.4000 mg | SUBLINGUAL_TABLET | SUBLINGUAL | Status: DC | PRN

## 2016-01-01 MED ORDER — METOPROLOL SUCCINATE ER 25 MG PO TB24: 25.0000 mg | ORAL_TABLET | Freq: Every day | ORAL | Status: DC

## 2016-01-01 MED ORDER — VANCOMYCIN 50 MG/ML ORAL SOLUTION: ORAL | Status: DC

## 2016-01-01 MED ORDER — ASPIRIN 81 MG PO TBEC: 81.0000 mg | DELAYED_RELEASE_TABLET | Freq: Every day | ORAL | Status: DC

## 2016-01-01 MED ORDER — FUROSEMIDE 20 MG PO TABS: 20.0000 mg | ORAL_TABLET | Freq: Every day | ORAL | Status: DC

## 2016-01-01 MED ORDER — ACETAMINOPHEN 325 MG PO TABS: 650.0000 mg | ORAL_TABLET | ORAL | Status: DC | PRN

## 2016-01-01 MED ORDER — LISINOPRIL 2.5 MG PO TABS: 2.5000 mg | ORAL_TABLET | Freq: Every day | ORAL | Status: DC

## 2016-01-01 MED ORDER — CLOPIDOGREL BISULFATE 75 MG PO TABS: 75.0000 mg | ORAL_TABLET | Freq: Every day | ORAL | Status: DC

## 2016-01-01 NOTE — Progress Notes (Signed)
Discharged to [facility], condition stable, out via stretcher by [facility].

## 2016-01-01 NOTE — Progress Notes (Signed)
PROGRESS NOTE  Elizabeth Oneal [id number] DOB: Dec 11, 1915 DOA: 12/26/2015 PCP: Vernell Morgans, MD  Summary: 72 yof with PMH of HTN. C. Diff, and HLD presented from SNF for complaints of SOB. Per son arrival she has been treated with IV Vancomycin and Zosyn for the last few weeks for PNA and required supplemental O2. She also was noted to have chronic cough with intermittent production and multiple bowel movements. In the ED she was found to be febrile with temp of 102.9, C. Diff positive, and troponin 3.2 with non-acute EKG. She was admitted for sepsis and elevated troponin.   Assessment/Plan: 1. C. difficile colitis. Afebrile, vital signs stable. Overall continues to improve. Continue oral vancomycin with taper as recommended by gastroenterology. Gastroenterology did recommend discontinuing oral budesonide. 2. Sepsis secondary to C. difficile colitis, resolved. 3. Acute hypoxic respiratory failure secondary to sepsis, resolved. 4. NSTEMI. Remains stable. Per cardiology continue Toprol-XL, lisinopril. Repeat echocardiogram in 3 months. Follow-up with cardiology in 2 weeks. No statin given Lipitor allergy as per cardiology. 5. New systolic CHF, compensated. Lower extremity edema improved. Likely ischemic versus Takotsubo CM.  6. Paroxysmal atrial fibrillation with rapid ventricular response, transient. In sinus rhythm. Cardiology recommended against anticoagulation at this time, thought to be related to sepsis. 7. Acute kidney injury superimposed on chronic kidney disease stage III. Secondary to sepsis.a. Stable. Suspect chronic disease anemia. Follow-up as an outpatient.   Overall improved.   Continue oral vancomycin.  return to skilled nursing facility today.   Code Status: DNR DVT prophylaxis: Lovenox Family Communication: No family at bedside Disposition Plan: Discharge to SNF today  Murray Hodgkins, MD  [facility]  Pager [phone] If 7PM-7AM, please contact night-coverage  at [email or web address], password [facility] 01/01/2016, 7:42 AM  LOS: 6 days   Consultants:  Cardiology   Procedures:  ECHO Study Conclusions - Left ventricle: The cavity size was normal. Systolic function was moderately to severely reduced. The estimated ejection fraction was in the range of 30% to 35%. Dyskinesis of the apical myocardium. Severe hypokinesis of the mid-apicalanteroseptal, anterior, inferior, and inferoseptal myocardium; consistent with infarction in the distribution of the left anterior descending coronary artery. There was fusion of early and atrial contributions to ventricular filling. The study is not technically sufficient to allow evaluation of LV diastolic function.  Antibiotics:  Vancomycin 2/11>>  HPI/Subjective: Feels good. Denies any pain, CP, or SOB. Has a mild cough.   Objective: Filed Vitals:   12/31/15 1814 12/31/15 1850 12/31/15 2110 01/01/16 0655  BP: 140/57 140/57 116/47 130/63  Pulse: 99 99 106 105  Temp: 98.5 F (36.9 C)  98.2 F (36.8 C) 98 F (36.7 C)  TempSrc: Oral  Oral Oral  Resp: 20  20 18   Height:      Weight:      SpO2: 95%  94% 93%    Intake/Output Summary (Last 24 hours) at 01/01/16 0742 Last data filed at 12/31/15 1510  Gross per 24 hour  Intake      0 ml  Output      0 ml  Net      0 ml     Filed Weights   12/26/15 0746 12/28/15 0500 12/29/15 0500  Weight: 72.576 kg (160 lb) 72.8 kg (160 lb 7.9 oz) 72.7 kg (160 lb 4.4 oz)    Exam:     Afebrile, not hypoxic on RA General:  Appears calm and comfortable Eyes: PERRL, normal lids, irises  Cardiovascular: RRR, no m/r/g. 1+ BLE edema, appears improved. Respiratory:  CTA bilaterally, no w/r/r. Normal respiratory effort. Abdomen: soft, nd, positive bowel sounds,  Musculoskeletal: grossly normal tone BUE/BLE Psychiatric: grossly normal mood and affect, speech fluent and appropriate. Oriented to self, location, month  Neurologic: grossly non-focal.  New  data reviewed:  CXR reviewed- atelectasis, shallow inspiration   Scheduled Meds: . aspirin EC  81 mg Oral Daily  . clopidogrel  75 mg Oral Daily  . enoxaparin (LOVENOX) injection  30 mg Subcutaneous Q24H  . furosemide  20 mg Oral Daily  . guaiFENesin  1,200 mg Oral BID  . lisinopril  2.5 mg Oral Daily  . metoprolol succinate  25 mg Oral Daily  . sertraline  25 mg Oral Daily  . sodium chloride  250 mL Intravenous Once  . vancomycin  125 mg Oral 4 times per day   Continuous Infusions:    Principal Problem:   C. difficile colitis Active Problems:   Hyperlipidemia LDL goal <130   Sepsis ([place])   NSTEMI (non-ST elevated myocardial infarction) (HCC)   Hypokalemia   CKD (chronic kidney disease) stage 3, GFR 30-59 ml/min   Acute respiratory failure with hypoxia (HCC)   Paroxysmal atrial fibrillation ([place])    By signing my name below, I, Rosalie Doctor attest that this documentation has been prepared under the direction and in the presence of Murray Hodgkins, MD Electronically signed: Rosalie Doctor, Scribe. 01/01/2016  I personally performed the services described in this documentation. All medical record entries made by the scribe were at my direction. I have reviewed the chart and agree that the record reflects my personal performance and is accurate and complete. Murray Hodgkins, MD

## 2016-01-01 NOTE — Progress Notes (Addendum)
Patient for d/c today to SNF bed at [facility] as pta. Family- son Coralyn Mark and patient aware and agreeable to this plan- plan transfer via EMS. [name], MSW, [place]

## 2016-01-13 ENCOUNTER — Encounter: Payer: Medicare Other | Admitting: Physician Assistant

## 2016-01-13 ENCOUNTER — Encounter: Payer: Self-pay | Admitting: Cardiology

## 2016-01-13 NOTE — Progress Notes (Signed)
No show

## 2016-01-13 NOTE — Assessment & Plan Note (Signed)
No show today

## 2016-01-20 ENCOUNTER — Encounter: Payer: Medicare Other | Admitting: Physician Assistant

## 2016-01-21 ENCOUNTER — Encounter: Payer: Medicare Other | Admitting: Adult Health

## 2016-01-21 NOTE — Progress Notes (Signed)
Cardiology Office Note   ERROR  

## 2016-01-27 ENCOUNTER — Encounter (HOSPITAL_COMMUNITY): Payer: Self-pay

## 2016-01-27 ENCOUNTER — Inpatient Hospital Stay (HOSPITAL_COMMUNITY)
Admission: EM | Admit: 2016-01-27 | Discharge: 2016-01-31 | DRG: 871 | Disposition: A | Discharge status: Skilled Nursing Facility | Payer: Medicare Other | Attending: Family Medicine | Admitting: Family Medicine

## 2016-01-27 ENCOUNTER — Emergency Department (HOSPITAL_COMMUNITY): Payer: Medicare Other

## 2016-01-27 ENCOUNTER — Ambulatory Visit: Payer: Medicare Other | Admitting: Gastroenterology

## 2016-01-27 DIAGNOSIS — N183 Chronic kidney disease, stage 3 unspecified: Secondary | ICD-10-CM | POA: Diagnosis present

## 2016-01-27 DIAGNOSIS — H9190 Unspecified hearing loss, unspecified ear: Secondary | ICD-10-CM | POA: Diagnosis present

## 2016-01-27 DIAGNOSIS — I480 Paroxysmal atrial fibrillation: Secondary | ICD-10-CM | POA: Diagnosis present

## 2016-01-27 DIAGNOSIS — I10 Essential (primary) hypertension: Secondary | ICD-10-CM | POA: Diagnosis present

## 2016-01-27 DIAGNOSIS — R509 Fever, unspecified: Secondary | ICD-10-CM

## 2016-01-27 DIAGNOSIS — F0390 Unspecified dementia without behavioral disturbance: Secondary | ICD-10-CM | POA: Diagnosis present

## 2016-01-27 DIAGNOSIS — Z9119 Patient's noncompliance with other medical treatment and regimen: Secondary | ICD-10-CM | POA: Diagnosis not present

## 2016-01-27 DIAGNOSIS — Z515 Encounter for palliative care: Secondary | ICD-10-CM | POA: Diagnosis not present

## 2016-01-27 DIAGNOSIS — Z8701 Personal history of pneumonia (recurrent): Secondary | ICD-10-CM | POA: Diagnosis not present

## 2016-01-27 DIAGNOSIS — E785 Hyperlipidemia, unspecified: Secondary | ICD-10-CM | POA: Diagnosis present

## 2016-01-27 DIAGNOSIS — R05 Cough: Secondary | ICD-10-CM | POA: Insufficient documentation

## 2016-01-27 DIAGNOSIS — A047 Enterocolitis due to Clostridium difficile: Secondary | ICD-10-CM | POA: Diagnosis present

## 2016-01-27 DIAGNOSIS — Z7982 Long term (current) use of aspirin: Secondary | ICD-10-CM | POA: Diagnosis not present

## 2016-01-27 DIAGNOSIS — I2510 Atherosclerotic heart disease of native coronary artery without angina pectoris: Secondary | ICD-10-CM | POA: Diagnosis present

## 2016-01-27 DIAGNOSIS — J9601 Acute respiratory failure with hypoxia: Secondary | ICD-10-CM | POA: Diagnosis present

## 2016-01-27 DIAGNOSIS — Z8249 Family history of ischemic heart disease and other diseases of the circulatory system: Secondary | ICD-10-CM | POA: Diagnosis not present

## 2016-01-27 DIAGNOSIS — I5022 Chronic systolic (congestive) heart failure: Secondary | ICD-10-CM | POA: Diagnosis present

## 2016-01-27 DIAGNOSIS — R059 Cough, unspecified: Secondary | ICD-10-CM | POA: Insufficient documentation

## 2016-01-27 DIAGNOSIS — Z7189 Other specified counseling: Secondary | ICD-10-CM | POA: Insufficient documentation

## 2016-01-27 DIAGNOSIS — J189 Pneumonia, unspecified organism: Secondary | ICD-10-CM | POA: Diagnosis present

## 2016-01-27 DIAGNOSIS — K219 Gastro-esophageal reflux disease without esophagitis: Secondary | ICD-10-CM | POA: Diagnosis present

## 2016-01-27 DIAGNOSIS — I252 Old myocardial infarction: Secondary | ICD-10-CM

## 2016-01-27 DIAGNOSIS — Z7401 Bed confinement status: Secondary | ICD-10-CM

## 2016-01-27 DIAGNOSIS — Y95 Nosocomial condition: Secondary | ICD-10-CM | POA: Diagnosis present

## 2016-01-27 DIAGNOSIS — A0472 Enterocolitis due to Clostridium difficile, not specified as recurrent: Secondary | ICD-10-CM | POA: Diagnosis present

## 2016-01-27 DIAGNOSIS — N189 Chronic kidney disease, unspecified: Secondary | ICD-10-CM

## 2016-01-27 DIAGNOSIS — N179 Acute kidney failure, unspecified: Secondary | ICD-10-CM | POA: Diagnosis present

## 2016-01-27 DIAGNOSIS — K5900 Constipation, unspecified: Secondary | ICD-10-CM | POA: Diagnosis present

## 2016-01-27 DIAGNOSIS — A419 Sepsis, unspecified organism: Secondary | ICD-10-CM | POA: Diagnosis present

## 2016-01-27 DIAGNOSIS — I130 Hypertensive heart and chronic kidney disease with heart failure and stage 1 through stage 4 chronic kidney disease, or unspecified chronic kidney disease: Secondary | ICD-10-CM | POA: Diagnosis present

## 2016-01-27 DIAGNOSIS — Z79899 Other long term (current) drug therapy: Secondary | ICD-10-CM | POA: Diagnosis not present

## 2016-01-27 DIAGNOSIS — Z66 Do not resuscitate: Secondary | ICD-10-CM | POA: Diagnosis present

## 2016-01-27 DIAGNOSIS — Z7902 Long term (current) use of antithrombotics/antiplatelets: Secondary | ICD-10-CM | POA: Diagnosis not present

## 2016-01-27 LAB — COMPREHENSIVE METABOLIC PANEL
ALT: 14 U/L (ref 14–54)
AST: 29 U/L (ref 15–41)
Albumin: 2.9 g/dL — ABNORMAL LOW (ref 3.5–5.0)
Alkaline Phosphatase: 59 U/L (ref 38–126)
Anion gap: 9 (ref 5–15)
BUN: 35 mg/dL — ABNORMAL HIGH (ref 6–20)
CO2: 36 mmol/L — ABNORMAL HIGH (ref 22–32)
Calcium: 8.2 mg/dL — ABNORMAL LOW (ref 8.9–10.3)
Chloride: 96 mmol/L — ABNORMAL LOW (ref 101–111)
Creatinine, Ser: 2.55 mg/dL — ABNORMAL HIGH (ref 0.44–1.00)
GFR calc Af Amer: 17 mL/min — ABNORMAL LOW (ref >60)
GFR calc non Af Amer: 15 mL/min — ABNORMAL LOW (ref >60)
Glucose, Bld: 129 mg/dL — ABNORMAL HIGH (ref 65–99)
Potassium: 3.8 mmol/L (ref 3.5–5.1)
Sodium: 141 mmol/L (ref 135–145)
Total Bilirubin: 0.9 mg/dL (ref 0.3–1.2)
Total Protein: 6.5 g/dL (ref 6.5–8.1)

## 2016-01-27 LAB — CBC WITH DIFFERENTIAL/PLATELET
Basophils Absolute: 0 10*3/uL (ref 0.0–0.1)
Basophils Relative: 0 %
Eosinophils Absolute: 0 10*3/uL (ref 0.0–0.7)
Eosinophils Relative: 0 %
HCT: 39.6 % (ref 36.0–46.0)
Hemoglobin: 12.2 g/dL (ref 12.0–15.0)
Lymphocytes Relative: 12 %
Lymphs Abs: 1.9 10*3/uL (ref 0.7–4.0)
MCH: 28.2 pg (ref 26.0–34.0)
MCHC: 30.8 g/dL (ref 30.0–36.0)
MCV: 91.5 fL (ref 78.0–100.0)
Monocytes Absolute: 0.7 10*3/uL (ref 0.1–1.0)
Monocytes Relative: 5 %
Neutro Abs: 13.3 10*3/uL — ABNORMAL HIGH (ref 1.7–7.7)
Neutrophils Relative %: 83 %
Platelets: 295 10*3/uL (ref 150–400)
RBC: 4.33 MIL/uL (ref 3.87–5.11)
RDW: 14.7 % (ref 11.5–15.5)
WBC: 15.9 10*3/uL — ABNORMAL HIGH (ref 4.0–10.5)

## 2016-01-27 LAB — URINALYSIS, ROUTINE W REFLEX MICROSCOPIC
Glucose, UA: NEGATIVE mg/dL
Hgb urine dipstick: NEGATIVE
Leukocytes, UA: NEGATIVE
Nitrite: NEGATIVE
Protein, ur: NEGATIVE mg/dL
Specific Gravity, Urine: 1.025 (ref 1.005–1.030)
pH: 5 (ref 5.0–8.0)

## 2016-01-27 LAB — PROTIME-INR
INR: 1.17 (ref 0.00–1.49)
Prothrombin Time: 15.1 seconds (ref 11.6–15.2)

## 2016-01-27 LAB — LACTIC ACID, PLASMA: LACTIC ACID, VENOUS: 1.4 mmol/L (ref 0.5–2.0)

## 2016-01-27 LAB — I-STAT CG4 LACTIC ACID, ED: Lactic Acid, Venous: 2.04 mmol/L (ref 0.5–2.0)

## 2016-01-27 LAB — APTT: APTT: 40 s — AB (ref 24–37)

## 2016-01-27 MED ORDER — DEXTROSE 5 % IV SOLN: 2.0000 g | Freq: Two times a day (BID) | INTRAVENOUS | Status: DC

## 2016-01-27 MED ORDER — SODIUM CHLORIDE 0.9 % IV SOLN
INTRAVENOUS | Status: DC
  Administered 2016-01-28: via INTRAVENOUS

## 2016-01-27 MED ORDER — ASPIRIN EC 81 MG PO TBEC
81.0000 mg | DELAYED_RELEASE_TABLET | Freq: Every day | ORAL | Status: DC
  Administered 2016-01-28 – 2016-01-31 (×4): 81 mg via ORAL
  Filled 2016-01-27 (×4): qty 1

## 2016-01-27 MED ORDER — ONDANSETRON HCL 4 MG/2ML IJ SOLN: 4.0000 mg | Freq: Four times a day (QID) | INTRAMUSCULAR | Status: DC | PRN

## 2016-01-27 MED ORDER — ONDANSETRON HCL 4 MG PO TABS
4.0000 mg | ORAL_TABLET | Freq: Four times a day (QID) | ORAL | Status: DC | PRN
  Filled 2016-01-27: qty 1

## 2016-01-27 MED ORDER — CLOPIDOGREL BISULFATE 75 MG PO TABS
75.0000 mg | ORAL_TABLET | Freq: Every day | ORAL | Status: DC
  Administered 2016-01-28 – 2016-01-31 (×4): 75 mg via ORAL
  Filled 2016-01-27 (×4): qty 1

## 2016-01-27 MED ORDER — VANCOMYCIN 50 MG/ML ORAL SOLUTION
125.0000 mg | ORAL | Status: DC
  Administered 2016-01-28 – 2016-01-30 (×2): 125 mg via ORAL
  Filled 2016-01-27 (×3): qty 2.5

## 2016-01-27 MED ORDER — ACETAMINOPHEN 650 MG RE SUPP: 650.0000 mg | Freq: Four times a day (QID) | RECTAL | Status: DC | PRN

## 2016-01-27 MED ORDER — SODIUM CHLORIDE 0.9 % IV BOLUS (SEPSIS)
1000.0000 mL | INTRAVENOUS | Status: AC
  Administered 2016-01-27: 1000 mL via INTRAVENOUS

## 2016-01-27 MED ORDER — SODIUM CHLORIDE 0.9 % IV SOLN: INTRAVENOUS | Status: AC

## 2016-01-27 MED ORDER — ALUM & MAG HYDROXIDE-SIMETH 200-200-20 MG/5ML PO SUSP: 30.0000 mL | Freq: Four times a day (QID) | ORAL | Status: DC | PRN

## 2016-01-27 MED ORDER — SODIUM CHLORIDE 0.9% FLUSH
3.0000 mL | Freq: Two times a day (BID) | INTRAVENOUS | Status: DC
  Administered 2016-01-28 – 2016-01-31 (×6): 3 mL via INTRAVENOUS

## 2016-01-27 MED ORDER — ENOXAPARIN SODIUM 30 MG/0.3ML ~~LOC~~ SOLN
30.0000 mg | SUBCUTANEOUS | Status: DC
  Administered 2016-01-29 – 2016-01-31 (×3): 30 mg via SUBCUTANEOUS
  Filled 2016-01-27 (×4): qty 0.3

## 2016-01-27 MED ORDER — HYDRALAZINE HCL 20 MG/ML IJ SOLN: 5.0000 mg | Freq: Four times a day (QID) | INTRAMUSCULAR | Status: DC | PRN

## 2016-01-27 MED ORDER — ADULT MULTIVITAMIN W/MINERALS CH
1.0000 | ORAL_TABLET | Freq: Every day | ORAL | Status: DC
  Administered 2016-01-29 – 2016-01-31 (×3): 1 via ORAL
  Filled 2016-01-27 (×4): qty 1

## 2016-01-27 MED ORDER — ACETAMINOPHEN 325 MG PO TABS
650.0000 mg | ORAL_TABLET | Freq: Four times a day (QID) | ORAL | Status: DC | PRN
Start: 2016-01-27 — End: 2016-01-31
  Filled 2016-01-27: qty 2

## 2016-01-27 MED ORDER — SODIUM CHLORIDE 0.9 % IV BOLUS (SEPSIS)
500.0000 mL | INTRAVENOUS | Status: AC
  Administered 2016-01-27: 500 mL via INTRAVENOUS

## 2016-01-27 MED ORDER — VANCOMYCIN HCL IN DEXTROSE 1-5 GM/200ML-% IV SOLN
1000.0000 mg | Freq: Once | INTRAVENOUS | Status: AC
  Administered 2016-01-27: 1000 mg via INTRAVENOUS
  Filled 2016-01-27: qty 200

## 2016-01-27 MED ORDER — ONDANSETRON HCL 4 MG/2ML IJ SOLN: 4.0000 mg | Freq: Three times a day (TID) | INTRAMUSCULAR | Status: AC | PRN

## 2016-01-27 MED ORDER — BUDESONIDE 3 MG PO CPEP
3.0000 mg | ORAL_CAPSULE | Freq: Every day | ORAL | Status: DC
  Administered 2016-01-28 – 2016-01-31 (×4): 3 mg via ORAL
  Filled 2016-01-27 (×5): qty 1

## 2016-01-27 MED ORDER — HYDROMORPHONE HCL 1 MG/ML IJ SOLN: 0.5000 mg | INTRAMUSCULAR | Status: DC | PRN

## 2016-01-27 MED ORDER — PANTOPRAZOLE SODIUM 40 MG PO TBEC
40.0000 mg | DELAYED_RELEASE_TABLET | Freq: Every day | ORAL | Status: DC
  Filled 2016-01-27: qty 1

## 2016-01-27 MED ORDER — METOPROLOL SUCCINATE ER 25 MG PO TB24
25.0000 mg | ORAL_TABLET | Freq: Every day | ORAL | Status: DC
  Administered 2016-01-29 – 2016-01-31 (×3): 25 mg via ORAL
  Filled 2016-01-27 (×4): qty 1

## 2016-01-27 MED ORDER — CEFEPIME HCL 2 G IJ SOLR
2.0000 g | Freq: Once | INTRAMUSCULAR | Status: AC
  Administered 2016-01-27: 2 g via INTRAVENOUS
  Filled 2016-01-27: qty 2

## 2016-01-27 MED ORDER — POTASSIUM CHLORIDE 20 MEQ PO PACK
20.0000 meq | PACK | Freq: Every day | ORAL | Status: DC
Start: 2016-01-28 — End: 2016-01-31
  Administered 2016-01-28 – 2016-01-31 (×4): 20 meq via ORAL
  Filled 2016-01-27 (×5): qty 1

## 2016-01-27 MED ORDER — CEFEPIME HCL 1 G IJ SOLR
1.0000 g | INTRAMUSCULAR | Status: DC
  Administered 2016-01-28 – 2016-01-30 (×2): 1 g via INTRAVENOUS
  Filled 2016-01-27 (×4): qty 1

## 2016-01-27 MED ORDER — VANCOMYCIN HCL IN DEXTROSE 1-5 GM/200ML-% IV SOLN: 1000.0000 mg | INTRAVENOUS | Status: DC

## 2016-01-27 NOTE — ED Notes (Signed)
Pt sent here from [place] for evaluation of fever. Unknown how high and no documentation of tylenol given

## 2016-01-27 NOTE — H&P (Signed)
[facility] Admission History and Physical       Elizabeth Oneal [id number] DOB: 08/15/1916 DOA: 01/27/2016  Referring physician:  EDP PCP: Vernell Morgans, MD  Specialists:   Chief Complaint: Fever Cough and SOB  HPI: Elizabeth Oneal is a 82 y.o. female with a history of CAD, HTN, Hyperlipidemia, and C. Diff Colitis sent to the ED from the [facility] SNF due to Fevers and Chills and SOB today.   She also reports a loss of Appetite.  She was found to have Fever and Tachycardia and O2 sats to 91%.   A Sepsis workup was initiated and she was placed on IV Antibiotics for HCAP (early findings on Chest X-ray).       Review of Systems:    Constitutional: No Weight Loss, No Weight Gain, Night Sweats, +Fevers, +Chills, Dizziness, Light Headedness, Fatigue, or Generalized Weakness HEENT: No Headaches, Difficulty Swallowing,Tooth/Dental Problems,Sore Throat,  No Sneezing, Rhinitis, Ear Ache, Nasal Congestion, or Post Nasal Drip,  Cardio-vascular:  No Chest pain, Orthopnea, PND, Edema in Lower Extremities, Anasarca, Dizziness, Palpitations  Resp: No Dyspnea, No DOE, No Productive Cough, +Non-Productive Cough, No Hemoptysis, No Wheezing.    GI: No Heartburn, Indigestion, Abdominal Pain, Nausea, Vomiting, Diarrhea, Constipation, Hematemesis, Hematochezia, Melena, Change in Bowel Habits,  +Loss of Appetite  GU: No Dysuria, No Change in Color of Urine, No Urgency or Urinary Frequency, No Flank pain.  Musculoskeletal: No Joint Pain or Swelling, No Decreased Range of Motion, No Back Pain.  Neurologic: No Syncope, No Seizures, Muscle Weakness, Paresthesia, Vision Disturbance or Loss, No Diplopia, No Vertigo, No Difficulty Walking,  Skin: No Rash or Lesions. Psych: No Change in Mood or Affect, No Depression or Anxiety, No Memory loss, No Confusion, or Hallucinations   Past Medical History  Diagnosis Date  . Hyperlipidemia   . Essential hypertension   . Degenerative disc disease, lumbar   . C.  difficile colitis   . Reflux   . Hiatal hernia   . History of pneumonia      Past Surgical History  Procedure Laterality Date  . Abdominal hysterectomy    . Cholecystectomy    . Appendectomy    . Abdominal surgery    . Fracture surgery    . Cataract surgery    . Joint replacement    . Colon surgery  2009      Prior to Admission medications   Medication Sig Start Date End Date Taking? Authorizing Provider  acetaminophen (TYLENOL) 325 MG tablet Take 2 tablets (650 mg total) by mouth every 4 (four) hours as needed for headache or mild pain. 01/01/16  Yes Samuella Cota, MD  aspirin EC 81 MG EC tablet Take 1 tablet (81 mg total) by mouth daily. 01/01/16  Yes Samuella Cota, MD  budesonide (ENTOCORT EC) 3 MG 24 hr capsule Take 3 mg by mouth daily.   Yes Historical Provider, MD  clopidogrel (PLAVIX) 75 MG tablet Take 1 tablet (75 mg total) by mouth daily. 01/01/16  Yes Samuella Cota, MD  furosemide (LASIX) 20 MG tablet Take 1 tablet (20 mg total) by mouth daily. 01/01/16  Yes Samuella Cota, MD  lisinopril (PRINIVIL,ZESTRIL) 2.5 MG tablet Take 1 tablet (2.5 mg total) by mouth daily. 01/01/16  Yes Samuella Cota, MD  Melatonin 3 MG TABS Take 1 tablet by mouth at bedtime.   Yes Historical Provider, MD  metoprolol succinate (TOPROL-XL) 25 MG 24 hr tablet Take 1 tablet (25 mg total) by mouth daily.  01/01/16  Yes Samuella Cota, MD  Multiple Vitamin (MULTIVITAMIN) tablet Take 1 tablet by mouth daily.   Yes Historical Provider, MD  nitroGLYCERIN (NITROSTAT) 0.4 MG SL tablet Place 1 tablet (0.4 mg total) under the tongue every 5 (five) minutes x 3 doses as needed for chest pain. 01/01/16  Yes Samuella Cota, MD  omeprazole (PRILOSEC) 20 MG capsule Take 20 mg by mouth 2 (two) times daily before a meal.   Yes Historical Provider, MD  potassium chloride (KLOR-CON) 20 MEQ packet Take 20 mEq by mouth daily.   Yes Historical Provider, MD  sertraline (ZOLOFT) 25 MG tablet Take 25 mg by  mouth daily.   Yes Historical Provider, MD  vancomycin (VANCOCIN) 50 mg/mL oral solution 125 mg PO QID x 8 days, then 125 mg PO BID x7 days, then 125 mg PO daily x7 days, then 125 mg PO every other day x 7days, then 125 mg PO every 3 days, then stop. 01/01/16  Yes Samuella Cota, MD     Allergies  Allergen Reactions  . Celebrex [Celecoxib]   . Codeine Other (See Comments)    Goes out of right state of mind.   . Lipitor [Atorvastatin]   . Morphine And Related   . Prozac [Fluoxetine Hcl]   . Vioxx [Rofecoxib]   . Xanax [Alprazolam] Other (See Comments)    Goes out of right state of mind.     Social History:  Bed-bound Resident of [facility]  reports that she has never smoked. She does not have any smokeless tobacco history on file. She reports that she does not drink alcohol or use illicit drugs.    Family History  Problem Relation Age of Onset  . Heart disease Sister        Physical Exam:  GEN:  Pleasant thin Frail Elderly  82 y.o. Caucasian female examined and in no acute distress; cooperative with exam Filed Vitals:   01/27/16 1817 01/27/16 1830 01/27/16 1930 01/27/16 1937  BP:  140/60 170/135   Pulse:   110   Temp: 100.9 F (38.3 C)     TempSrc: Rectal     Resp:   26   Height:    5\' 4"  (1.626 m)  Weight:    72.576 kg (160 lb)  SpO2:   97%    Blood pressure 170/135, pulse 110, temperature 100.9 F (38.3 C), temperature source Rectal, resp. rate 26, height 5\' 4"  (1.626 m), weight 72.576 kg (160 lb), SpO2 97 %. PSYCH: She is alert and oriented x4; does not appear anxious does not appear depressed; affect is normal HEENT: Normocephalic and Atraumatic, Mucous membranes pink; PERRLA; EOM intact; Fundi:  Benign;  No scleral icterus, Nares: Patent, Oropharynx: Clear, Poor Dentition,    Neck:  FROM, No Cervical Lymphadenopathy nor Thyromegaly or Carotid Bruit; No JVD; Breasts:: Not examined CHEST WALL: No tenderness CHEST: Normal respiration, clear to auscultation  bilaterally HEART: Regular rate and rhythm; no murmurs rubs or gallops BACK: No kyphosis or scoliosis; No CVA tenderness ABDOMEN: Positive Bowel Sounds, Soft Non-Tender, No Rebound or Guarding; No Masses, No Organomegaly. Rectal Exam: Not done EXTREMITIES: No Cyanosis, Clubbing, or Edema; No Ulcerations. Genitalia: not examined PULSES: 2+ and symmetric SKIN: Normal hydration no rash or ulceration CNS:  Alert and Oriented x 4, No Focal Deficits Vascular: pulses palpable throughout    Labs on Admission:  Basic Metabolic Panel:  Recent Labs Lab 01/27/16 1839  NA 141  K 3.8  CL 96*  CO2 36*  GLUCOSE 129*  BUN 35*  CREATININE 2.55*  CALCIUM 8.2*   Liver Function Tests:  Recent Labs Lab 01/27/16 1839  AST 29  ALT 14  ALKPHOS 59  BILITOT 0.9  PROT 6.5  ALBUMIN 2.9*   No results for input(s): LIPASE, AMYLASE in the last 168 hours. No results for input(s): AMMONIA in the last 168 hours. CBC:  Recent Labs Lab 01/27/16 1839  WBC 15.9*  NEUTROABS 13.3*  HGB 12.2  HCT 39.6  MCV 91.5  PLT 295   Cardiac Enzymes: No results for input(s): CKTOTAL, CKMB, CKMBINDEX, TROPONINI in the last 168 hours.  BNP (last 3 results) No results for input(s): BNP in the last 8760 hours.  ProBNP (last 3 results) No results for input(s): PROBNP in the last 8760 hours.  CBG: No results for input(s): GLUCAP in the last 168 hours.  Radiological Exams on Admission: Dg Chest Port 1 View  01/27/2016  CLINICAL DATA:  Acute onset of fever and high blood pressure. Mild cough. Initial encounter. EXAM: PORTABLE CHEST 1 VIEW COMPARISON:  Chest radiograph performed 12/31/2015 FINDINGS: The lungs are hypoexpanded. Vascular crowding and vascular congestion are seen. Peribronchial thickening is noted. Mild bilateral opacities likely reflect atelectasis. No definite pleural effusion or pneumothorax is seen. The cardiomediastinal silhouette is normal in size. No acute osseous abnormalities are  identified. IMPRESSION: Lungs hypoexpanded. Vascular congestion noted. Peribronchial thickening noted. Mild bilateral opacities likely reflect atelectasis. Electronically Signed   By: Garald Balding M.D.   On: 01/27/2016 18:33     EKG: Independently reviewed.    Assessment/Plan:      82 y.o. female with  Principal Problem:    Sepsis ([place])- Most Likely Early HCAP   IV Vancomycin and Cefepime   IVFs    Active Problems:    Acute renal failure (ARF) (HCC)AKI (acute kidney injury) (HCC)   IVFs   Monitor BUN/ Cr      Essential hypertension         CKD (chronic kidney disease) stage 3, GFR 30-59 ml/min   Monitor BUN/Cr        C. difficile colitis   Continue Oral Vancomycin taper   Enteric Precautions     DVT Prophylaxis   Lovenox     Code Status:    DO NOT RESUSCITATE         Family Communication:   Son (POA) at Bedside    Disposition Plan:    Inpatient Status with Expected LOS 2-3 days     Time spent:  [facility] Pager [phone]   If [place] Please Contact the Day Rounding Team MD for [facility]  If 7PM-7AM, Please Contact Night-Floor Coverage  [email or web address] Password [facility] 01/27/2016, 8:08 PM     ADDENDUM:   Patient was seen and examined on 01/27/2016

## 2016-01-27 NOTE — ED Notes (Signed)
Lab at bedside now to collect blood cultures.

## 2016-01-27 NOTE — ED Provider Notes (Signed)
CSN: [id number]     Arrival date & time 01/27/16  1736 History   First MD Initiated Contact with Patient 01/27/16 1746     Chief Complaint  Patient presents with  . Fever     (Consider location/radiation/quality/duration/timing/severity/associated sxs/prior Treatment) HPI Comments: Elizabeth Oneal is a 82 y.o. female who presents today with a fever. She arrived confused from a nursing facility. Patient reportedly is being treated with Vancomycin PO for C. Diff. Patient reports she has had an unproductive cough. She reports a decreased appetite. Unknown if fever was treated at nursing facility.    Patient is a 82 y.o. female presenting with fever. The history is provided by the patient, the nursing home and the EMS personnel. The history is limited by the condition of the patient and the absence of a caregiver. No language interpreter was used.  Fever Associated symptoms: cough   Associated symptoms: no dysuria     Past Medical History  Diagnosis Date  . Hyperlipidemia   . Essential hypertension   . Degenerative disc disease, lumbar   . C. difficile colitis   . Reflux   . Hiatal hernia   . History of pneumonia    Past Surgical History  Procedure Laterality Date  . Abdominal hysterectomy    . Cholecystectomy    . Appendectomy    . Abdominal surgery    . Fracture surgery    . Cataract surgery    . Joint replacement    . Colon surgery  2009   Family History  Problem Relation Age of Onset  . Heart disease Sister    Social History  Substance Use Topics  . Smoking status: Never Smoker   . Smokeless tobacco: None  . Alcohol Use: No   OB History    No data available     Review of Systems  Unable to perform ROS: Dementia  Constitutional: Positive for fever.  Respiratory: Positive for cough.   Genitourinary: Negative for dysuria.      Allergies  Celebrex; Codeine; Lipitor; Morphine and related; Prozac; Vioxx; and Xanax  Home Medications   Prior to Admission  medications   Medication Sig Start Date End Date Taking? Authorizing Provider  acetaminophen (TYLENOL) 325 MG tablet Take 2 tablets (650 mg total) by mouth every 4 (four) hours as needed for headache or mild pain. 01/01/16  Yes Samuella Cota, MD  aspirin EC 81 MG EC tablet Take 1 tablet (81 mg total) by mouth daily. 01/01/16  Yes Samuella Cota, MD  budesonide (ENTOCORT EC) 3 MG 24 hr capsule Take 3 mg by mouth daily.   Yes Historical Provider, MD  clopidogrel (PLAVIX) 75 MG tablet Take 1 tablet (75 mg total) by mouth daily. 01/01/16  Yes Samuella Cota, MD  furosemide (LASIX) 20 MG tablet Take 1 tablet (20 mg total) by mouth daily. 01/01/16  Yes Samuella Cota, MD  lisinopril (PRINIVIL,ZESTRIL) 2.5 MG tablet Take 1 tablet (2.5 mg total) by mouth daily. 01/01/16  Yes Samuella Cota, MD  Melatonin 3 MG TABS Take 1 tablet by mouth at bedtime.   Yes Historical Provider, MD  metoprolol succinate (TOPROL-XL) 25 MG 24 hr tablet Take 1 tablet (25 mg total) by mouth daily. 01/01/16  Yes Samuella Cota, MD  Multiple Vitamin (MULTIVITAMIN) tablet Take 1 tablet by mouth daily.   Yes Historical Provider, MD  nitroGLYCERIN (NITROSTAT) 0.4 MG SL tablet Place 1 tablet (0.4 mg total) under the tongue every 5 (five) minutes  x 3 doses as needed for chest pain. 01/01/16  Yes Samuella Cota, MD  omeprazole (PRILOSEC) 20 MG capsule Take 20 mg by mouth 2 (two) times daily before a meal.   Yes Historical Provider, MD  potassium chloride (KLOR-CON) 20 MEQ packet Take 20 mEq by mouth daily.   Yes Historical Provider, MD  sertraline (ZOLOFT) 25 MG tablet Take 25 mg by mouth daily.   Yes Historical Provider, MD  vancomycin (VANCOCIN) 50 mg/mL oral solution 125 mg PO QID x 8 days, then 125 mg PO BID x7 days, then 125 mg PO daily x7 days, then 125 mg PO every other day x 7days, then 125 mg PO every 3 days, then stop. 01/01/16  Yes Samuella Cota, MD   BP 97/47 mmHg  Pulse 104  Temp(Src) 100.9 F (38.3 C)  (Rectal)  Resp 17  Ht 5\' 4"  (1.626 m)  Wt 72.576 kg  BMI 27.45 kg/m2  SpO2 96% Physical Exam  Constitutional: She appears well-developed and well-nourished. No distress.  HENT:  Head: Normocephalic and atraumatic.  Eyes: Conjunctivae are normal. Right eye exhibits no discharge. Left eye exhibits no discharge. No scleral icterus.  Neck: Normal range of motion. Neck supple. No thyromegaly present.  Cardiovascular: Regular rhythm, normal heart sounds and intact distal pulses.  Tachycardia present.  Exam reveals no gallop and no friction rub.   No murmur heard. Pulmonary/Chest: Effort normal and breath sounds normal. No stridor. No respiratory distress. She has no wheezes. She has no rales.  Abdominal: Soft. Bowel sounds are normal. She exhibits no distension. There is no tenderness. There is no rebound and no guarding.  Genitourinary:  Patient has likely candida rash due to her Depends.  Musculoskeletal: She exhibits no edema.  Lymphadenopathy:    She has no cervical adenopathy.  Neurological: She is alert. Coordination normal.  Skin: Skin is warm and dry. No rash noted. She is not diaphoretic. No pallor.  No infectious cause noted on full body exam; one well healing, scabbed abrasion on R arm  Psychiatric: Cognition and memory are impaired.  Nursing note and vitals reviewed.   ED Course  Procedures (including critical care time) Labs Review Labs Reviewed  URINALYSIS, ROUTINE W REFLEX MICROSCOPIC (NOT AT [facility]) - Abnormal; Notable for the following:    Bilirubin Urine SMALL (*)    Ketones, ur TRACE (*)    All other components within normal limits  COMPREHENSIVE METABOLIC PANEL - Abnormal; Notable for the following:    Chloride 96 (*)    CO2 36 (*)    Glucose, Bld 129 (*)    BUN 35 (*)    Creatinine, Ser 2.55 (*)    Calcium 8.2 (*)    Albumin 2.9 (*)    GFR calc non Af Amer 15 (*)    GFR calc Af Amer 17 (*)    All other components within normal limits  CBC WITH  DIFFERENTIAL/PLATELET - Abnormal; Notable for the following:    WBC 15.9 (*)    Neutro Abs 13.3 (*)    All other components within normal limits  I-STAT CG4 LACTIC ACID, ED - Abnormal; Notable for the following:    Lactic Acid, Venous 2.04 (*)    All other components within normal limits  URINE CULTURE  CULTURE, BLOOD (ROUTINE X 2)  CULTURE, BLOOD (ROUTINE X 2)  I-STAT CG4 LACTIC ACID, ED    Imaging Review Dg Chest Port 1 View  01/27/2016  CLINICAL DATA:  Acute onset of fever  and high blood pressure. Mild cough. Initial encounter. EXAM: PORTABLE CHEST 1 VIEW COMPARISON:  Chest radiograph performed 12/31/2015 FINDINGS: The lungs are hypoexpanded. Vascular crowding and vascular congestion are seen. Peribronchial thickening is noted. Mild bilateral opacities likely reflect atelectasis. No definite pleural effusion or pneumothorax is seen. The cardiomediastinal silhouette is normal in size. No acute osseous abnormalities are identified. IMPRESSION: Lungs hypoexpanded. Vascular congestion noted. Peribronchial thickening noted. Mild bilateral opacities likely reflect atelectasis. Electronically Signed   By: Garald Balding M.D.   On: 01/27/2016 18:33   I have personally reviewed and evaluated these images and lab results as part of my medical decision-making.   EKG Interpretation   Date/Time:  Wednesday January 27 2016 17:50:49 EDT Ventricular Rate:  116 PR Interval:  125 QRS Duration: 117 QT Interval:  340 QTC Calculation: 472 R Axis:   -47 Text Interpretation:  Sinus tachycardia LVH with IVCD, LAD and secondary  repol abnrm Probable inferior infarct, recent Anterior ST elevation,  probably due to LVH Since last tracing rate slower Confirmed by Sabra Heck   MD, BRIAN ([id number]) on 01/27/2016 6:46:04 PM      MDM   Patient arrived via EMS from nursing facility. Reported fever. Unknown onset. Patient confused, but reports she has had a cough and does not recall any dysuria. Patient being treated  for C. Diff. w Vanc PO. Code Sepsis called. WBC 15.9. CMP Albumin low 2.9, Cr and BUN high, GFR low. UA small Bili, trace ketones, no obvious UTI. CXR Vasc congestion, peribronchial thickening, mild bilateral opacities. EKG sinus tach, LVH with IVCD. Hypoxic 88% on room air. Dr. Sabra Heck spoke with Dr. Arnoldo Morale who agreed to admit the patient to inpatient telemetry. Dr. Sabra Heck also evaluated this patient and agrees with the plan.  Final diagnoses:  Cough  Fever      Frederica Kuster, PA-C 01/27/16 [place], PA-C 01/27/16 [place], MD 01/29/16 [phone]

## 2016-01-27 NOTE — Progress Notes (Addendum)
Pharmacy Note:  Initial antibiotics for Vancomycin and Cefepime ordered by EDP for HCAP.  Estimated Creatinine Clearance: 12 mL/min (by C-G formula based on Cr of 2.55).   Allergies  Allergen Reactions  . Celebrex [Celecoxib]   . Codeine Other (See Comments)    Goes out of right state of mind.   . Lipitor [Atorvastatin]   . Morphine And Related   . Prozac [Fluoxetine Hcl]   . Vioxx [Rofecoxib]   . Xanax [Alprazolam] Other (See Comments)    Goes out of right state of mind.     Filed Vitals:   01/27/16 1830 01/27/16 1930  BP: 140/60 170/135  Pulse:  110  Temp:    Resp:  26    Anti-infectives    Start     Dose/Rate Route Frequency Ordered Stop   01/29/16 1800  vancomycin (VANCOCIN) IVPB 1000 mg/200 mL premix     1,000 mg 200 mL/hr over 60 Minutes Intravenous Every 48 hours 01/27/16 2048     01/28/16 2000  ceFEPIme (MAXIPIME) 1 g in dextrose 5 % 50 mL IVPB     1 g 100 mL/hr over 30 Minutes Intravenous Every 24 hours 01/27/16 2047     01/28/16 0645  ceFEPIme (MAXIPIME) 2 g in dextrose 5 % 50 mL IVPB  Status:  Discontinued     2 g 100 mL/hr over 30 Minutes Intravenous Every 12 hours 01/27/16 2045 01/27/16 2047   01/27/16 1845  ceFEPIme (MAXIPIME) 2 g in dextrose 5 % 50 mL IVPB     2 g 100 mL/hr over 30 Minutes Intravenous  Once 01/27/16 1842 01/27/16 1927   01/27/16 1845  vancomycin (VANCOCIN) IVPB 1000 mg/200 mL premix     1,000 mg 200 mL/hr over 60 Minutes Intravenous  Once 01/27/16 1842 01/27/16 1951      Plan: Initial doses of Vancomycin and Cefepime X 1 ordered. F/U admission orders for further dosing if therapy continued.  Pricilla Larsson, [facility] 01/27/2016 8:50 PM   PM:   Vancomycin and Cefepime to be continued on admission.  Poor renal function noted. Vancomycin 1gm IV every 48 and Cefepime 1gm IV every 24 hours. Monitor labs, micro and vitals.   Pricilla Larsson, [facility] 01/27/2016 8:50 PM

## 2016-01-27 NOTE — ED Provider Notes (Signed)
The patient is a 82 year old female, she is elderly, she does not have a good memory, EMS reports that the patient has had a fever, increased coughing and some hypoxia per EMS. The patient is unable to give me very much information however she does appear mildly tachypneic, she is tachycardic to 115, she has a fever, she is hypoxic to 88%. Supplemental oxygen given, chest x-ray ordered, labs, sepsis protocol started. I suspect the patient has either aspiration pneumonia or some other source of infection and will likely need antibiotics and admission. She has had a borderline blood pressure and appears to have some hypotension which has required 30 mL/kg IV fluid bolus and broad-spectrum healthcare associated pneumonia antibiotics as it was likely that pneumonia was the likely source. The patient is critically ill and appears septic   D/w Dr. Arnoldo Morale who will admit to hospital  CRITICAL CARE Performed by: Johnna Acosta Total critical care time: 35 minutes Critical care time was exclusive of separately billable procedures and treating other patients. Critical care was necessary to treat or prevent imminent or life-threatening deterioration. Critical care was time spent personally by me on the following activities: development of treatment plan with patient and/or surrogate as well as nursing, discussions with consultants, evaluation of patient's response to treatment, examination of patient, obtaining history from patient or surrogate, ordering and performing treatments and interventions, ordering and review of laboratory studies, ordering and review of radiographic studies, pulse oximetry and re-evaluation of patient's condition.   EKG Interpretation  Date/Time:  Wednesday January 27 2016 17:50:49 EDT Ventricular Rate:  116 PR Interval:  125 QRS Duration: 117 QT Interval:  340 QTC Calculation: 472 R Axis:   -47 Text Interpretation:  Sinus tachycardia LVH with IVCD, LAD and secondary repol abnrm  Probable inferior infarct, recent Anterior ST elevation, probably due to LVH Since last tracing rate slower Confirmed by Anneliese Leblond  MD, [place] ([id number]) on 01/27/2016 6:46:04 PM      Medical screening examination/treatment/procedure(s) were conducted as a shared visit with non-physician practitioner(s) and myself.  I personally evaluated the patient during the encounter.  Clinical Impression:   Final diagnoses:  Cough  Fever  Sepsis Acute Renal Failure       Noemi Chapel, MD 01/29/16 [phone]

## 2016-01-28 ENCOUNTER — Encounter (HOSPITAL_COMMUNITY): Payer: Self-pay | Admitting: Primary Care

## 2016-01-28 DIAGNOSIS — J189 Pneumonia, unspecified organism: Secondary | ICD-10-CM | POA: Insufficient documentation

## 2016-01-28 DIAGNOSIS — Z7189 Other specified counseling: Secondary | ICD-10-CM

## 2016-01-28 DIAGNOSIS — N179 Acute kidney failure, unspecified: Secondary | ICD-10-CM | POA: Insufficient documentation

## 2016-01-28 DIAGNOSIS — N189 Chronic kidney disease, unspecified: Secondary | ICD-10-CM

## 2016-01-28 DIAGNOSIS — Z515 Encounter for palliative care: Secondary | ICD-10-CM | POA: Insufficient documentation

## 2016-01-28 LAB — BASIC METABOLIC PANEL
Anion gap: 8 (ref 5–15)
BUN: 34 mg/dL — ABNORMAL HIGH (ref 6–20)
CO2: 33 mmol/L — ABNORMAL HIGH (ref 22–32)
Calcium: 7.7 mg/dL — ABNORMAL LOW (ref 8.9–10.3)
Chloride: 103 mmol/L (ref 101–111)
Creatinine, Ser: 2.11 mg/dL — ABNORMAL HIGH (ref 0.44–1.00)
GFR calc Af Amer: 21 mL/min — ABNORMAL LOW (ref >60)
GFR calc non Af Amer: 18 mL/min — ABNORMAL LOW (ref >60)
Glucose, Bld: 107 mg/dL — ABNORMAL HIGH (ref 65–99)
Potassium: 3.5 mmol/L (ref 3.5–5.1)
Sodium: 144 mmol/L (ref 135–145)

## 2016-01-28 LAB — CBC
HEMATOCRIT: 34.4 % — AB (ref 36.0–46.0)
HEMATOCRIT: 36.1 % (ref 36.0–46.0)
HEMOGLOBIN: 11.2 g/dL — AB (ref 12.0–15.0)
Hemoglobin: 10.8 g/dL — ABNORMAL LOW (ref 12.0–15.0)
MCH: 28.8 pg (ref 26.0–34.0)
MCH: 28.5 pg (ref 26.0–34.0)
MCHC: 31.4 g/dL (ref 30.0–36.0)
MCHC: 31 g/dL (ref 30.0–36.0)
MCV: 91.7 fL (ref 78.0–100.0)
MCV: 91.9 fL (ref 78.0–100.0)
Platelets: 235 10*3/uL (ref 150–400)
Platelets: 232 10*3/uL (ref 150–400)
RBC: 3.75 MIL/uL — AB (ref 3.87–5.11)
RBC: 3.93 MIL/uL (ref 3.87–5.11)
RDW: 14.8 % (ref 11.5–15.5)
RDW: 14.6 % (ref 11.5–15.5)
WBC: 11.1 10*3/uL — AB (ref 4.0–10.5)
WBC: 11.9 10*3/uL — ABNORMAL HIGH (ref 4.0–10.5)

## 2016-01-28 LAB — INFLUENZA PANEL BY PCR (TYPE A & B)
H1N1FLUPCR: NOT DETECTED
Influenza A By PCR: NEGATIVE
Influenza B By PCR: NEGATIVE

## 2016-01-28 LAB — PROCALCITONIN: Procalcitonin: <0.1 ng/mL

## 2016-01-28 LAB — LACTIC ACID, PLASMA: Lactic Acid, Venous: 1.1 mmol/L (ref 0.5–2.0)

## 2016-01-28 LAB — MRSA PCR SCREENING: MRSA BY PCR: NEGATIVE

## 2016-01-28 MED ORDER — OSELTAMIVIR PHOSPHATE 30 MG PO CAPS
30.0000 mg | ORAL_CAPSULE | Freq: Every day | ORAL | Status: DC
  Administered 2016-01-29 – 2016-01-31 (×3): 30 mg via ORAL
  Filled 2016-01-28 (×5): qty 1

## 2016-01-28 MED ORDER — SACCHAROMYCES BOULARDII 250 MG PO CAPS
250.0000 mg | ORAL_CAPSULE | Freq: Two times a day (BID) | ORAL | Status: DC
  Administered 2016-01-28 – 2016-01-31 (×6): 250 mg via ORAL
  Filled 2016-01-28 (×6): qty 1

## 2016-01-28 MED ORDER — ENSURE ENLIVE PO LIQD
237.0000 mL | Freq: Two times a day (BID) | ORAL | Status: DC
  Administered 2016-01-28 – 2016-01-31 (×4): 237 mL via ORAL

## 2016-01-28 MED ORDER — SODIUM CHLORIDE 0.9 % IV SOLN
INTRAVENOUS | Status: DC
  Filled 2016-01-28: qty 1000

## 2016-01-28 MED ORDER — CETYLPYRIDINIUM CHLORIDE 0.05 % MT LIQD
7.0000 mL | Freq: Two times a day (BID) | OROMUCOSAL | Status: DC
  Administered 2016-01-28 – 2016-01-31 (×6): 7 mL via OROMUCOSAL

## 2016-01-28 NOTE — Progress Notes (Signed)
[facility] PROGRESS NOTE  Elizabeth Oneal [id number] DOB: 06-Oct-1916 DOA: 01/27/2016 PCP: Vernell Morgans, MD  Assessment/Plan: #1 sepsis/??HCAP Unknown etiology. Patient had presented with fever and chills, shortness of breath. Patient being treated for C. difficile colitis already on oral vancomycin taper from last hospitalization. Concern for healthcare associated pneumonia. Will check an influenza PCR. Placed on droplet precaution. Contact precaution. Patient refusing oral medications this morning. Continue empiric IV cefepime. We'll discontinue vancomycin. Continue oral vancomycin. Will start patient on Tamiflu.  #2 C. difficile colitis being treated prior to admission Patient with a C. difficile colitis from last hospitalization was a vancomycin taper. Continue current oral vancomycin taper. Will add Florastor. Monitor electrolytes and replete. Discontinue Protonix. Gentle hydration as patient with poor oral intake. Follow.  #3 acute on chronic chronic kidney disease stage III Likely secondary to prerenal azotemia as patient with poor oral intake and looks clinically dehydrated on examination. Patient also being treated for C. difficile colitis. Check urine electrolytes. Gentle hydration. Follow.  #4 hypertension Blood pressure has been borderline. Monitor closely.  #5 prophylaxis Discontinue Protonix. Lovenox for DVT prophylaxis.   Code Status: DO NOT RESUSCITATE Family Communication: Updated patient. No family present. Disposition Plan: Back to nursing home when medically stable with palliative care to follow.   Consultants:  Palliative care: Quinn Axe, NP 01/28/2016  Procedures:  Chest x-ray. 01/27/2016    Antibiotics:  Oral vancomycin started on previous discharge patient on a taper for C. difficile colitis.  IV vancomycin 01/27/2016>>>>> 01/28/2016  IV cefepime 01/27/2016  HPI/Subjective: Patient denies any chest pain. No shortness of breath. Patient  states feels better than on admission. Per nursing patient refusing medications and oral intake this morning.  Objective: Filed Vitals:   01/27/16 2249 01/28/16 1300  BP: 105/48 116/58  Pulse: 105 97  Temp: 98.2 F (36.8 C) 98.1 F (36.7 C)  Resp: 16 17    Intake/Output Summary (Last 24 hours) at 01/28/16 1607 Last data filed at 01/28/16 1243  Gross per 24 hour  Intake     60 ml  Output      0 ml  Net     60 ml   Filed Weights   01/27/16 1937 01/27/16 2249  Weight: 72.576 kg (160 lb) 62.052 kg (136 lb 12.8 oz)    Exam:   General:  NAD. Dry mucous membranes.  Cardiovascular: RRR  Respiratory: CTAB anterior lung fields  Abdomen: soft, nontender, nondistended, positive bowel sounds  Musculoskeletal:  no clubbing, cyanosis or edema.   Data Reviewed: Basic Metabolic Panel:  Recent Labs Lab 01/27/16 1839 01/28/16 0200  NA 141 144  K 3.8 3.5  CL 96* 103  CO2 36* 33*  GLUCOSE 129* 107*  BUN 35* 34*  CREATININE 2.55* 2.11*  CALCIUM 8.2* 7.7*   Liver Function Tests:  Recent Labs Lab 01/27/16 1839  AST 29  ALT 14  ALKPHOS 59  BILITOT 0.9  PROT 6.5  ALBUMIN 2.9*   No results for input(s): LIPASE, AMYLASE in the last 168 hours. No results for input(s): AMMONIA in the last 168 hours. CBC:  Recent Labs Lab 01/27/16 1839 01/28/16 0200  WBC 15.9* 11.1*  NEUTROABS 13.3*  --   HGB 12.2 10.8*  HCT 39.6 34.4*  MCV 91.5 91.7  PLT 295 235   Cardiac Enzymes: No results for input(s): CKTOTAL, CKMB, CKMBINDEX, TROPONINI in the last 168 hours. BNP (last 3 results) No results for input(s): BNP in the last 8760 hours.  ProBNP (last 3 results) No  results for input(s): PROBNP in the last 8760 hours.  CBG: No results for input(s): GLUCAP in the last 168 hours.  Recent Results (from the past 240 hour(s))  Urine culture     Status: None (Preliminary result)   Collection Time: 01/27/16  6:30 PM  Result Value Ref Range Status   Specimen Description URINE,  CATHETERIZED  Final   Special Requests NONE  Final   Culture   Final    NO GROWTH < 24 HOURS Performed at [facility]    Report Status PENDING  Incomplete  Blood Culture (routine x 2)     Status: None (Preliminary result)   Collection Time: 01/27/16  6:48 PM  Result Value Ref Range Status   Specimen Description BLOOD RIGHT HAND  Final   Special Requests   Final    BOTTLES DRAWN AEROBIC AND ANAEROBIC AEB=6CC ANA=4CC   Culture NO GROWTH < 24 HOURS  Final   Report Status PENDING  Incomplete  Blood Culture (routine x 2)     Status: None (Preliminary result)   Collection Time: 01/27/16  6:54 PM  Result Value Ref Range Status   Specimen Description BLOOD RIGHT ARM  Final   Special Requests BOTTLES DRAWN AEROBIC AND ANAEROBIC 8CC EACH  Final   Culture NO GROWTH < 24 HOURS  Final   Report Status PENDING  Incomplete  MRSA PCR Screening     Status: None   Collection Time: 01/28/16 12:15 AM  Result Value Ref Range Status   MRSA by PCR NEGATIVE NEGATIVE Final    Comment:        The GeneXpert MRSA Assay (FDA approved for NASAL specimens only), is one component of a comprehensive MRSA colonization surveillance program. It is not intended to diagnose MRSA infection nor to guide or monitor treatment for MRSA infections.      Studies: Dg Chest Port 1 View  01/27/2016  CLINICAL DATA:  Acute onset of fever and high blood pressure. Mild cough. Initial encounter. EXAM: PORTABLE CHEST 1 VIEW COMPARISON:  Chest radiograph performed 12/31/2015 FINDINGS: The lungs are hypoexpanded. Vascular crowding and vascular congestion are seen. Peribronchial thickening is noted. Mild bilateral opacities likely reflect atelectasis. No definite pleural effusion or pneumothorax is seen. The cardiomediastinal silhouette is normal in size. No acute osseous abnormalities are identified. IMPRESSION: Lungs hypoexpanded. Vascular congestion noted. Peribronchial thickening noted. Mild bilateral opacities likely  reflect atelectasis. Electronically Signed   By: Garald Balding M.D.   On: 01/27/2016 18:33    Scheduled Meds: . sodium chloride   Intravenous STAT  . sodium chloride   Intravenous STAT  . antiseptic oral rinse  7 mL Mouth Rinse BID  . aspirin EC  81 mg Oral Daily  . budesonide  3 mg Oral Daily  . ceFEPime (MAXIPIME) IV  1 g Intravenous Q24H  . clopidogrel  75 mg Oral Daily  . enoxaparin (LOVENOX) injection  30 mg Subcutaneous Q24H  . feeding supplement (ENSURE ENLIVE)  237 mL Oral BID BM  . metoprolol succinate  25 mg Oral Daily  . multivitamin with minerals  1 tablet Oral Daily  . oseltamivir  30 mg Oral Daily  . pantoprazole  40 mg Oral Daily  . potassium chloride  20 mEq Oral Daily  . sodium chloride flush  3 mL Intravenous Q12H  . vancomycin  125 mg Oral QODAY  . [START ON 01/29/2016] vancomycin  1,000 mg Intravenous Q48H   Continuous Infusions:   Principal Problem:   Sepsis ([place])  Active Problems:   Essential hypertension   C. difficile colitis   CKD (chronic kidney disease) stage 3, GFR 30-59 ml/min   Acute renal failure (ARF) (H[facility])   AKI (acute kidney injury) ([place])   Palliative care encounter   Advanced care planning/counseling discussion    Time spent: 72 minutes    THOMPSON,DANIEL  M.D.  [facility] Pager [phone]. If 7PM-7AM, please contact night-coverage at [email or web address], password [facility] 01/28/2016, 4:07 PM  LOS: 1 day

## 2016-01-28 NOTE — Care Management Note (Signed)
Case Management Note  Patient Details  Name: Elizabeth Oneal MRN: [id number] Date of Birth: Oct 28, 1916  Subjective/Objective:                    Action/Plan: Anticipated discharge plan is for patient to return to [facility] at discharge.   Expected Discharge Date:                  Expected Discharge Plan:  [place]  In-House Referral:     Discharge planning Services  CM Consult  Post Acute Care Choice:    Choice offered to:     DME Arranged:    DME Agency:     HH Arranged:    [place] Agency:     Status of Service:  In process, will continue to follow  Medicare Important Message Given:    Date Medicare IM Given:    Medicare IM give by:    Date Additional Medicare IM Given:    Additional Medicare Important Message give by:     If discussed at [place] of Stay Meetings, dates discussed:    Additional Comments:  Alvie Heidelberg, RN 01/28/2016, 8:31 AM

## 2016-01-28 NOTE — [place] FL2 (Signed)
Combine MEDICAID FL2 LEVEL OF CARE SCREENING TOOL     IDENTIFICATION  Patient Name: Elizabeth Oneal Birthdate: November 03, 1916 Sex: female Admission Date (Current Location): 01/27/2016  [facility] and [place] Number:  [employer] and Address:  [place] [address] [place]      Provider Number: [id number]  Attending Physician Name and Address:  Eugenie Filler, MD  Relative Name and Phone Number:       Current Level of Care: Hospital Recommended Level of Care: [place] Prior Approval Number:    Date Approved/Denied:   PASRR Number:    Discharge Plan: SNF    Current Diagnoses: Patient Active Problem List   Diagnosis Date Noted  . Acute renal failure (ARF) ([place]) 01/27/2016  . AKI (acute kidney injury) ([place]) 01/27/2016  . Cough   . Paroxysmal atrial fibrillation ([place]) 12/29/2015  . C. difficile colitis 12/26/2015  . Sepsis ([place]) 12/26/2015  . NSTEMI (non-ST elevated myocardial infarction) ([place]) 12/26/2015  . Hypokalemia 12/26/2015  . CKD (chronic kidney disease) stage 3, GFR 30-59 ml/min 12/26/2015  . Acute respiratory failure with hypoxia ([place]) 12/26/2015  . History of depression 03/22/2015  . Frailty 03/22/2015  . PSEUDOMEMBRANOUS COLITIS 02/03/2009  . Hyperlipidemia LDL goal <130 02/03/2009  . Essential hypertension 02/03/2009  . SCHATZKI'S RING 02/03/2009  . GERD 02/03/2009  . COLITIS 02/03/2009  . DYSPHAGIA UNSPECIFIED 02/03/2009  . DIARRHEA 02/03/2009  . DIVERTICULITIS, HX OF 02/03/2009    Orientation RESPIRATION BLADDER Height & Weight     Self  O2 (2/L continious) Incontinent Weight: 136 lb 12.8 oz (62.052 kg) Height:  5\' 4"  (162.6 cm)  BEHAVIORAL SYMPTOMS/MOOD NEUROLOGICAL BOWEL NUTRITION STATUS  Other (Comment) (no behaviors)   Incontinent Diet (Heart Healthy)  AMBULATORY STATUS COMMUNICATION OF NEEDS Skin   Extensive Assist Verbally PU Stage and Appropriate Care (wound on arm)   PU Stage 2  Dressing: BID                   Personal Care Assistance Level of Assistance  Bathing, Feeding, Dressing Bathing Assistance: Limited assistance Feeding assistance: Limited assistance Dressing Assistance: Limited assistance     Functional Limitations Info  Sight, Hearing, Speech Sight Info: Impaired Hearing Info: Impaired Speech Info: Adequate    SPECIAL CARE FACTORS FREQUENCY                       Contractures Contractures Info: Not present    Additional Factors Info  Code Status, Allergies, Psychotropic, Insulin Sliding Scale, Isolation Precautions Code Status Info: DNR Allergies Info: Celebrex, Codeine, Lipitor, Morphine And Related, Prozac, Vioxx, Xanax Psychotropic Info: none Insulin Sliding Scale Info: none Isolation Precautions Info: [place]      Current Medications (01/28/2016):  This is the current hospital active medication list Current Facility-Administered Medications  Medication Dose Route Frequency Provider Last Rate Last Dose  . 0.9 %  sodium chloride infusion   Intravenous STAT Alexandra M Law, PA-C      . 0.9 %  sodium chloride infusion   Intravenous Continuous Theressa Millard, MD 75 mL/hr at 01/28/16 0015    . 0.9 %  sodium chloride infusion   Intravenous STAT Alexandra M Law, PA-C      . acetaminophen (TYLENOL) tablet 650 mg  650 mg Oral Q6H PRN Theressa Millard, MD       Or  . acetaminophen (TYLENOL) suppository 650 mg  650 mg Rectal Q6H PRN Harvette  Evonnie Dawes, MD      . alum & mag hydroxide-simeth (MAALOX/MYLANTA) 200-200-20 MG/5ML suspension 30 mL  30 mL Oral Q6H PRN Theressa Millard, MD      . antiseptic oral rinse (CPC / CETYLPYRIDINIUM CHLORIDE 0.05%) solution 7 mL  7 mL Mouth Rinse BID Theressa Millard, MD   7 mL at 01/28/16 0015  . aspirin EC tablet 81 mg  81 mg Oral Daily Harvette Evonnie Dawes, MD      . budesonide (ENTOCORT EC) 24 hr capsule 3 mg  3 mg Oral Daily Harvette C Jenkins, MD      . ceFEPIme (MAXIPIME) 1 g in dextrose 5  % 50 mL IVPB  1 g Intravenous Q24H Harvette Evonnie Dawes, MD      . clopidogrel (PLAVIX) tablet 75 mg  75 mg Oral Daily Harvette Evonnie Dawes, MD      . enoxaparin (LOVENOX) injection 30 mg  30 mg Subcutaneous Q24H Harvette Evonnie Dawes, MD      . hydrALAZINE (APRESOLINE) injection 5 mg  5 mg Intravenous Q6H PRN Theressa Millard, MD      . HYDROmorphone (DILAUDID) injection 0.5 mg  0.5 mg Intravenous Q3H PRN Theressa Millard, MD      . metoprolol succinate (TOPROL-XL) 24 hr tablet 25 mg  25 mg Oral Daily Harvette Evonnie Dawes, MD      . multivitamin with minerals tablet 1 tablet  1 tablet Oral Daily Harvette Evonnie Dawes, MD      . ondansetron (ZOFRAN) injection 4 mg  4 mg Intravenous Q8H PRN Alexandra M Law, PA-C      . ondansetron (ZOFRAN) tablet 4 mg  4 mg Oral Q6H PRN Theressa Millard, MD       Or  . ondansetron (ZOFRAN) injection 4 mg  4 mg Intravenous Q6H PRN Theressa Millard, MD      . ondansetron (ZOFRAN) injection 4 mg  4 mg Intravenous Q8H PRN Alexandra M Law, PA-C      . oseltamivir (TAMIFLU) capsule 30 mg  30 mg Oral Daily Irine Seal V, MD      . pantoprazole (PROTONIX) EC tablet 40 mg  40 mg Oral Daily Harvette Evonnie Dawes, MD      . potassium chloride (KLOR-CON) packet 20 mEq  20 mEq Oral Daily Harvette Evonnie Dawes, MD      . sodium chloride flush (NS) 0.9 % injection 3 mL  3 mL Intravenous Q12H Theressa Millard, MD   3 mL at 01/28/16 0014  . vancomycin (VANCOCIN) 50 mg/mL oral solution 125 mg  125 mg Oral QODAY Theressa Millard, MD      . Derrill Memo ON 01/29/2016] vancomycin (VANCOCIN) IVPB 1000 mg/200 mL premix  1,000 mg Intravenous Q48H Theressa Millard, MD         Discharge Medications: Please see discharge summary for a list of discharge medications.  Relevant Imaging Results:  Relevant Lab Results:   Additional Information    Lilly Cove, LCSW

## 2016-01-28 NOTE — Progress Notes (Signed)
Initial Nutrition Assessment  DOCUMENTATION CODES:  Not applicable  INTERVENTION:  Ensure Enlive po BID, each supplement provides 350 kcal and 20 grams of protein  Magic cup q dinner, each supplement provides 290 kcal and 9 grams of protein  NUTRITION DIAGNOSIS:  Increased nutrient needs related to wound healing/acute illness as evidenced by estimated nutritional requirements for this condition  GOAL:  Patient will meet greater than or equal to 90% of their needs  MONITOR:  PO intake, Supplement acceptance, Labs, Skin  REASON FOR ASSESSMENT:  Low Braden    ASSESSMENT:  82 y/o female PMhx CAD, HTN, HLD, C diff colitis who presented from [facility] due fevers, chills, SOB, loss of appetite. Admitted for likely early HCAP.   Pt would only give very brief answers to all asked questions. When asked why she wasn't eating she states "I dont know". She gave the same response to the inquiry about when her last good meal was. She did not know of any foods she wanted. RD offered ice cream/ensure. She said she liked both of these. When asked if she would drink Ensure if it was presented to her, she states "id try".   She denies n/v/c. She has had significant diarrhea.   She did not know what her usual weight was. Per EMR documentation, the 160 lbs has frequently been reported as her UBW. Unsure when last time she actually weighed this much was. Per distant ED visit documentation. It appears that she weighs now, about what she did in 2013. Attempted to get bed weight today, but scale was not functional.   NFPE: Mild-moderate muscle/fat wasting appreciated. Though this may be stable vs normal aging as pt is 82 years old. Had fair amount of swelling in lower extremities. This would be setting of chronic illness and not applicable to this acute illness.   Labs reviewed: Decreased renal function. Hypocalcemia. Leukocytosis, Depressed H/H.   Diet Order:  Diet Heart Room service appropriate?: Yes;  Fluid consistency:: Thin  Skin: Stage 2 ulcer R arm.   Last BM:  3-15 diarhea  Height:  Ht Readings from Last 1 Encounters:  01/27/16 5\' 4"  (1.626 m)   Weight:  Wt Readings from Last 1 Encounters:  01/27/16 136 lb 12.8 oz (62.052 kg)   Wt Readings from Last 10 Encounters:  01/27/16 136 lb 12.8 oz (62.052 kg)  12/29/15 160 lb 4.4 oz (72.7 kg)  03/18/15 160 lb (72.576 kg)  12/30/13 160 lb (72.576 kg)  07/30/12 140 lb (63.504 kg)  03/26/12 130 lb (58.968 kg)   Ideal Body Weight:  54.54 kg  BMI:  Body mass index is 23.47 kg/(m^2).  Estimated Nutritional Needs:  Kcal:  1450-1650 (23-27 kcal/kg bw) Protein:  62-75 g Pro (1-1.2 g/kg bw) Fluid:  1.6 liters  EDUCATION NEEDS:  No education needs identified at this time  Burtis Junes RD, LDN Nutrition Pager: [phone] 01/28/2016 2:24 PM

## 2016-01-28 NOTE — Clinical Social Work Note (Signed)
Clinical Social Work Assessment  Patient Details  Name: Elizabeth Oneal MRN: [id number] Date of Birth: 15-Jan-1916  Date of referral:  01/28/16               Reason for consult:  Other (Comment Required) (patient from a facility: SNF)                Permission sought to share information with:  Case Manager, Customer service manager, Family Supports Permission granted to share information::     Name::        Agency::  [facility]  Relationship::  Husband  Contact Information:     Housing/Transportation Living arrangements for the past 2 months:  [place] of Information:  Medical Team, Facility, Patient Patient Interpreter Needed:  None Criminal Activity/Legal Involvement Pertinent to Current Situation/Hospitalization:  No - Comment as needed Significant Relationships:  Spouse, Other Family Members Lives with:  Facility Resident Do you feel safe going back to the place where you live?  Yes Need for family participation in patient care:  Yes (Comment)  Care giving concerns:  No care giving concerns at this time. Patient recent admission in Feb and will return to [facility] where she has been residing.  Husband involved. No barriers or concerns with care.  Call placed to facility for additional information regarding patient's care at SNF/LTC.   Social Worker assessment / plan:  LCSW received consult for facility resident. No concerns with safety or care at facility. No family concerns. Reviewed chart, updated FL2 and left message for facility to call back regarding care/level at DC. Patient currently on contact percautions. LCSW will facilitate DC back to SNF once pt medically stable. MD please sign FL2  Employment status:  Retired Forensic scientist:  Medicare PT Recommendations:  Not assessed at this time Information / Referral to community resources:  Other (Comment Required) (none requested at this time)  Patient/Family's Response to care:  Agreeable to  plan  Patient/Family's Understanding of and Emotional Response to Diagnosis, Current Treatment, and Prognosis:  Will follow up with patient once strict contact precautions have stabilized. Husband aware of current diagnosis and prognosis.  Agreeable with plan with no barriers.    Emotional Assessment Appearance:  Appears stated age Attitude/Demeanor/Rapport:  Unable to Assess (patient under strict contact percautions.  Will see patient once more medically stable) Affect (typically observed):  Calm Orientation:  Oriented to Self Alcohol / Substance use:  Not Applicable Psych involvement (Current and /or in the community):  No (Comment)  Discharge Needs  Concerns to be addressed:  No discharge needs identified Readmission within the last 30 days:  Yes (recent DC in mid Feb 2017) Current discharge risk:  None Barriers to Discharge:  No Barriers Identified, Continued Medical Work up   Lilly Cove, LCSW 01/28/2016, 10:17 AM

## 2016-01-28 NOTE — Progress Notes (Signed)
Pt is combative and agitated when prompted by staff for any care.  She is refusing PO medications and all PO intake including Meals and fluids.  Dr. Grandville Silos is aware.  Will continue to monitor.

## 2016-01-28 NOTE — Consult Note (Signed)
Consultation Note Date: 01/28/2016   Patient Name: Elizabeth Oneal  DOB: 27-Feb-1916  MRN: [id number]  Age / Sex: 82 y.o., female  PCP: Vernell Morgans, MD Referring Physician: Eugenie Filler, MD  Reason for Consultation: Disposition, Establishing goals of care and Psychosocial/spiritual support  Clinical Assessment/Narrative: Elizabeth Oneal is resting quietly in bed, no family at bedside.  Elizabeth Oneal. She will take liquids from me but not eat.  I ask if she is ready to return to [facility], she states, "no". She will not look at me at that point.   Call to son Elizabeth Oneal who shares Elizabeth Oneal's story, about being bitten by her cat/declining with GI problems (due to cat bite per Elizabeth Oneal), and eventual placement in [facility]. Elizabeth Oneal shares that Elizabeth Oneal has continued to have problems with constipation and her bowels since. He shares that when she is constipated, her behaviors change.  We talk about her recurrent PNE (he states that she was just in the hospital with pneumonia last month)  and that this is not unexpected in people with dementia and those who have poor mobility.  Elizabeth Oneal shares that Elizabeth Oneal has only been diagnosed with dementia about 1 years time.   We talk about healthcare power of attorney and also advanced directives.  We talk about the concepts of allow a natural death and let nature take its course.  Elizabeth Oneal shares that his wife is currently battling cancer that has reoccurred, and that he has not had much time to spend with his mother during this time.  Contacts/Participants in Discussion:  Elizabeth Oneal is unable to participate, brief call to son, Elizabeth Oneal. Primary Decision Maker: sons Elizabeth Oneal and Elizabeth Oneal  Relationship to Patient as above HCPOA: yes  Sons share HCPOA  SUMMARY OF RECOMMENDATIONS  Code Status/Advance Care Planning: DNR    Code Status Orders        Start     Ordered   01/27/16 2030  Do not attempt resuscitation (DNR)   Continuous    Question Answer Comment  In the event of cardiac or respiratory ARREST Do not call a "code blue"   In the event of cardiac or respiratory ARREST Do not perform Intubation, CPR, defibrillation or ACLS   In the event of cardiac or respiratory ARREST Use medication by any route, position, wound care, and other measures to relive pain and suffering. May use oxygen, suction and manual treatment of airway obstruction as needed for comfort.      01/27/16 2029    Code Status History    Date Active Date Inactive Code Status Order ID Comments User Context   12/26/2015  6:38 PM 01/01/2016  5:08 PM DNR [id number]  Kathie Dike, MD Inpatient    Advance Directive Documentation        Most Recent Value   Type of Advance Directive  Healthcare Power of Attorney   Pre-existing out of facility DNR order (yellow form or pink MOST form)     "MOST" Form in Place?        Other Directives:MOST Form at [facility], not here at [facility]  Symptom Management:   Per hospitalist  Palliative Prophylaxis:   Aspiration, Oral Care and Turn Reposition  Additional Recommendations (Limitations, Scope, Preferences):  treat the treatable  Psycho-social/Spiritual:  Support System: Adequate Desire for further Chaplaincy support:not discussed today Additional Recommendations: Education on Hospice  Prognosis: Unable to determine, based on outcomes, likely 4 months or less dt recurrent  PNE, dementia.   Discharge Planning: [facility] for rehab with Palliative care service follow-up   Chief Complaint/ Primary Diagnoses: Present on Admission:  . Sepsis ([place]) . Acute renal failure (ARF) ([place]) . AKI (acute kidney injury) ([place]) . CKD (chronic kidney disease) stage 3, GFR 30-59 ml/min . C. difficile colitis . Essential hypertension  I have reviewed the medical record, interviewed the patient and family, and examined the patient. The following  aspects are pertinent.  Past Medical History  Diagnosis Date  . Hyperlipidemia   . Essential hypertension   . Degenerative disc disease, lumbar   . C. difficile colitis   . Reflux   . Hiatal hernia   . History of pneumonia    Social History   Social History  . Marital Status: Widowed    Spouse Name: N/A  . Number of Children: N/A  . Years of Education: N/A   Social History Main Topics  . Smoking status: Never Smoker   . Smokeless tobacco: None  . Alcohol Use: No  . Drug Use: No  . Sexual Activity: Not Asked   Other Topics Concern  . None   Social History Narrative   Family History  Problem Relation Age of Onset  . Heart disease Sister    Scheduled Meds: . sodium chloride   Intravenous STAT  . sodium chloride   Intravenous STAT  . antiseptic oral rinse  7 mL Mouth Rinse BID  . aspirin EC  81 mg Oral Daily  . budesonide  3 mg Oral Daily  . ceFEPime (MAXIPIME) IV  1 g Intravenous Q24H  . clopidogrel  75 mg Oral Daily  . enoxaparin (LOVENOX) injection  30 mg Subcutaneous Q24H  . feeding supplement (ENSURE ENLIVE)  237 mL Oral BID BM  . metoprolol succinate  25 mg Oral Daily  . multivitamin with minerals  1 tablet Oral Daily  . oseltamivir  30 mg Oral Daily  . pantoprazole  40 mg Oral Daily  . potassium chloride  20 mEq Oral Daily  . sodium chloride flush  3 mL Intravenous Q12H  . vancomycin  125 mg Oral QODAY  . [START ON 01/29/2016] vancomycin  1,000 mg Intravenous Q48H   Continuous Infusions:  PRN Meds:.acetaminophen **OR** acetaminophen, alum & mag hydroxide-simeth, hydrALAZINE, HYDROmorphone (DILAUDID) injection, ondansetron **OR** ondansetron (ZOFRAN) IV Medications Prior to Admission:  Prior to Admission medications   Medication Sig Start Date End Date Taking? Authorizing Provider  acetaminophen (TYLENOL) 325 MG tablet Take 2 tablets (650 mg total) by mouth every 4 (four) hours as needed for headache or mild pain. 01/01/16  Yes Samuella Cota, MD    aspirin EC 81 MG EC tablet Take 1 tablet (81 mg total) by mouth daily. 01/01/16  Yes Samuella Cota, MD  budesonide (ENTOCORT EC) 3 MG 24 hr capsule Take 3 mg by mouth daily.   Yes Historical Provider, MD  clopidogrel (PLAVIX) 75 MG tablet Take 1 tablet (75 mg total) by mouth daily. 01/01/16  Yes Samuella Cota, MD  furosemide (LASIX) 20 MG tablet Take 1 tablet (20 mg total) by mouth daily. 01/01/16  Yes Samuella Cota, MD  lisinopril (PRINIVIL,ZESTRIL) 2.5 MG tablet Take 1 tablet (2.5 mg total) by mouth daily. 01/01/16  Yes Samuella Cota, MD  Melatonin 3 MG TABS Take 1 tablet by mouth at bedtime.   Yes Historical Provider, MD  metoprolol succinate (TOPROL-XL) 25 MG 24 hr tablet Take 1 tablet (25 mg total) by mouth daily.  01/01/16  Yes Samuella Cota, MD  Multiple Vitamin (MULTIVITAMIN) tablet Take 1 tablet by mouth daily.   Yes Historical Provider, MD  nitroGLYCERIN (NITROSTAT) 0.4 MG SL tablet Place 1 tablet (0.4 mg total) under the tongue every 5 (five) minutes x 3 doses as needed for chest pain. 01/01/16  Yes Samuella Cota, MD  omeprazole (PRILOSEC) 20 MG capsule Take 20 mg by mouth 2 (two) times daily before a meal.   Yes Historical Provider, MD  potassium chloride (KLOR-CON) 20 MEQ packet Take 20 mEq by mouth daily.   Yes Historical Provider, MD  sertraline (ZOLOFT) 25 MG tablet Take 25 mg by mouth daily.   Yes Historical Provider, MD  vancomycin (VANCOCIN) 50 mg/mL oral solution 125 mg PO QID x 8 days, then 125 mg PO BID x7 days, then 125 mg PO daily x7 days, then 125 mg PO every other day x 7days, then 125 mg PO every 3 days, then stop. 01/01/16  Yes Samuella Cota, MD   Allergies  Allergen Reactions  . Celebrex [Celecoxib]   . Codeine Other (See Comments)    Goes out of right state of mind.   . Lipitor [Atorvastatin]   . Morphine And Related   . Prozac [Fluoxetine Hcl]   . Vioxx [Rofecoxib]   . Xanax [Alprazolam] Other (See Comments)    Goes out of right state of  mind.     Review of Systems  Unable to perform ROS: Dementia    Physical Exam  Nursing note and vitals reviewed. Constitutional: No distress.  HENT:  Head: Normocephalic and atraumatic.  Cardiovascular: Normal rate.   Respiratory: No respiratory distress.  GI: Soft. She exhibits no distension.  Neurological: She is alert.  Skin: Skin is warm and dry.    Vital Signs: BP 105/48 mmHg  Pulse 105  Temp(Src) 98.2 F (36.8 C) (Axillary)  Resp 16  Ht 5\' 4"  (1.626 m)  Wt 62.052 kg (136 lb 12.8 oz)  BMI 23.47 kg/m2  SpO2 94%  SpO2: SpO2: 94 % O2 Device:SpO2: 94 % O2 Flow Rate: .O2 Flow Rate (L/min): 2 L/min  IO: Intake/output summary:  Intake/Output Summary (Last 24 hours) at 01/28/16 1434 Last data filed at 01/28/16 1243  Gross per 24 hour  Intake     60 ml  Output      0 ml  Net     60 ml    LBM: Last BM Date: 01/27/16 Baseline Weight: Weight: 72.576 kg (160 lb) Most recent weight: Weight: 62.052 kg (136 lb 12.8 oz)      Palliative Assessment/Data:  Flowsheet Rows        Most Recent Value   Intake Tab    Referral Department  Hospitalist   Unit at Time of Referral  Med/Surg Unit   Palliative Care Primary Diagnosis  Sepsis/Infectious Disease   Date Notified  01/28/16   Palliative Care Type  New Palliative care   Reason for referral  Advance Care Planning   Date of Admission  01/27/16   Date first seen by Palliative Care  01/28/16   # of days Palliative referral response time  0 Day(s)   # of days IP prior to Palliative referral  1   Clinical Assessment    Palliative Performance Scale Score  20%   Pain Max last 24 hours  Not able to report   Pain Min Last 24 hours  Not able to report   Dyspnea Max Last 24 Hours  Not able  to report   Dyspnea Min Last 24 hours  Not able to report   Psychosocial & Spiritual Assessment    Palliative Care Outcomes    Patient/Family meeting held?  No   Who was at the meeting?  Patient unable to participate, brief call to son    Palliative Care Outcomes  Provided advance care planning, Provided psychosocial or spiritual support   Patient/Family wishes: Interventions discontinued/not started   Mechanical Ventilation   Palliative Care follow-up planned  -- [Follow-up at [facility]]      Additional Data Reviewed:  CBC:    Component Value Date/Time   WBC 11.1* 01/28/2016 0200   HGB 10.8* 01/28/2016 0200   HCT 34.4* 01/28/2016 0200   PLT 235 01/28/2016 0200   MCV 91.7 01/28/2016 0200   NEUTROABS 13.3* 01/27/2016 1839   LYMPHSABS 1.9 01/27/2016 1839   MONOABS 0.7 01/27/2016 1839   EOSABS 0.0 01/27/2016 1839   BASOSABS 0.0 01/27/2016 1839   Comprehensive Metabolic Panel:    Component Value Date/Time   NA 144 01/28/2016 0200   NA 135 03/19/2015 1031   K 3.5 01/28/2016 0200   CL 103 01/28/2016 0200   CO2 33* 01/28/2016 0200   BUN 34* 01/28/2016 0200   BUN 29 03/19/2015 1031   CREATININE 2.11* 01/28/2016 0200   CREATININE 1.49* 03/04/2013 1535   GLUCOSE 107* 01/28/2016 0200   GLUCOSE 114* 03/19/2015 1031   CALCIUM 7.7* 01/28/2016 0200   AST 29 01/27/2016 1839   ALT 14 01/27/2016 1839   ALKPHOS 59 01/27/2016 1839   BILITOT 0.9 01/27/2016 1839   BILITOT 0.5 03/19/2015 1031   PROT 6.5 01/27/2016 1839   PROT 6.8 03/19/2015 1031   ALBUMIN 2.9* 01/27/2016 1839   ALBUMIN 4.4 03/19/2015 1031     Time In: 1400 Time Out: 1450 Time Total: 50 minutes Greater than 50%  of this time was spent counseling and coordinating care related to the above assessment and plan.  Signed by: Drue Novel, NP  Drue Novel, NP  01/28/2016, 2:34 PM  Please [facility] Palliative Medicine Team phone at [phone] for questions and concerns.

## 2016-01-29 DIAGNOSIS — A419 Sepsis, unspecified organism: Principal | ICD-10-CM

## 2016-01-29 DIAGNOSIS — N183 Chronic kidney disease, stage 3 (moderate): Secondary | ICD-10-CM

## 2016-01-29 DIAGNOSIS — N189 Chronic kidney disease, unspecified: Secondary | ICD-10-CM

## 2016-01-29 DIAGNOSIS — A047 Enterocolitis due to Clostridium difficile: Secondary | ICD-10-CM

## 2016-01-29 DIAGNOSIS — J189 Pneumonia, unspecified organism: Secondary | ICD-10-CM

## 2016-01-29 DIAGNOSIS — N179 Acute kidney failure, unspecified: Secondary | ICD-10-CM

## 2016-01-29 LAB — URINE CULTURE: Culture: NO GROWTH

## 2016-01-29 LAB — BASIC METABOLIC PANEL
ANION GAP: 7 (ref 5–15)
BUN: 28 mg/dL — ABNORMAL HIGH (ref 6–20)
CO2: 30 mmol/L (ref 22–32)
Calcium: 8.1 mg/dL — ABNORMAL LOW (ref 8.9–10.3)
Chloride: 108 mmol/L (ref 101–111)
Creatinine, Ser: 1.15 mg/dL — ABNORMAL HIGH (ref 0.44–1.00)
GFR, EST AFRICAN AMERICAN: 44 mL/min — AB (ref >60)
GFR, EST NON AFRICAN AMERICAN: 38 mL/min — AB (ref >60)
Glucose, Bld: 101 mg/dL — ABNORMAL HIGH (ref 65–99)
POTASSIUM: 3.6 mmol/L (ref 3.5–5.1)
SODIUM: 145 mmol/L (ref 135–145)

## 2016-01-29 MED ORDER — LORAZEPAM 2 MG/ML IJ SOLN: 0.5000 mg | Freq: Once | INTRAMUSCULAR | Status: DC

## 2016-01-29 NOTE — Care Management Important Message (Signed)
Important Message  Patient Details  Name: Elizabeth Oneal MRN: [id number] Date of Birth: [birth year]   Medicare Important Message Given:  Yes    Alvie Heidelberg, RN 01/29/2016, 8:14 AM

## 2016-01-29 NOTE — Progress Notes (Signed)
PROGRESS NOTE  LACORA CAMERA [id number] DOB: Feb 03, 1916 DOA: 01/27/2016 PCP: Vernell Morgans, MD  Summary: 82 -year-old woman just discharged February 16, treated for cc C. difficile colitis with sepsis, acute hypoxic respiratory failure, NSTEMI, new systolic heart failure who presented 3/15 from [facility] with fever, chills and shortness of breath. Found to be tachycardic and febrile with hypoxic respiratory failure. Admitted for sepsis, HCAP, AKI,   Assessment/Plan: 1. Sepsis, thought secondary to HCAP clinically although chest x-ray unrevealing. Etiology unclear. Blood and urine cx showed no growth at 24 hr. started empirically on Tamiflu. Influenza PCR negative. Afebrile 24 hours. Minimal leukocytosis. Hemodynamics stable. Hypoxia stable. 2. AKI superimposed on CKD stage III. Resolved. Likely secondary to prerenal azotemia.  3. C diff colitis treated PTA. Asymptomatic. Pt was on Vanc taper fro last hospitalization. Protonix discontinued. 4. Chronic systolic CHF, dx 99991111. Ischemic versus Takotsubo CM. LVEF 30-35%. Appears stable. 5. PAF 6. PMH NSTEMI 12/2015. 7. Dementia    Overall appears stable, improving. She did have some confusion during her last admission. She is currently compliant. We'll continue antibiotics today and hopefully can transition to oral antibiotics tomorrow.  Continue empiric IV cefepime and PO Vanc.   Code Status: DNR DVT prophylaxis: Lovenox Family Communication: none present Disposition Plan: Discharge back to SNF once improved  Murray Hodgkins, MD  [facility]  Pager [phone] If 7PM-7AM, please contact night-coverage at [email or web address], password [facility] 01/29/2016, 8:16 AM  LOS: 2 days   Consultants:  Palliative care  Procedures:  none  Antibiotics: 1. Oral vancomycin started on previous discharge patient on a taper for C. difficile colitis. 2. IV Vanc 3/15 >> 3/16 3. Cefepime 3/15 >>  HPI/Subjective: Has been noncompliant with nursing,  refusing medications at times and refusing to wear oxygen at times. Confused. Did have success when grandson administered her medications this morning.  Objective: Filed Vitals:   01/28/16 1300 01/28/16 2000 01/29/16 0001 01/29/16 0300  BP: 116/58 116/52 125/40 121/44  Pulse: 97 102 103 102  Temp: 98.1 F (36.7 C) 98.4 F (36.9 C) 99 F (37.2 C) 98.5 F (36.9 C)  TempSrc: Oral Oral Axillary Axillary  Resp: 17 16 16 16   Height:      Weight:      SpO2: 94% 100% 100% 93%    Intake/Output Summary (Last 24 hours) at 01/29/16 0816 Last data filed at 01/28/16 1900  Gross per 24 hour  Intake    180 ml  Output      0 ml  Net    180 ml     Filed Weights   01/27/16 1937 01/27/16 2249  Weight: 72.576 kg (160 lb) 62.052 kg (136 lb 12.8 oz)    Exam:    General:  Appears calm and comfortable Eyes: PERRL, normal lids, irises & conjunctiva ENT: grossly normal hearing, lips Cardiovascular: RRR, no m/r/g. No LE edema. Telemetry: SR, no arrhythmias  Respiratory: CTA bilaterally, no w/r/r. Normal respiratory effort. Abdomen: soft, ntnd Musculoskeletal: grossly normal tone BUE/BLE Psychiatric: Confused, disoriented to location but follows simple commands. Permit examination and is willing to have oxygen on. Neurologic: grossly non-focal.  New data reviewed:  Potassium 3.6  Creatinine 1.15, much improved. BUN trending downwards.  Hemoglobin stable 11.2, WBC stable  Pertinent data since admission:  none  Pending data:  Blood and urine cx  Scheduled Meds: . antiseptic oral rinse  7 mL Mouth Rinse BID  . aspirin EC  81 mg Oral Daily  . budesonide  3 mg Oral Daily  .  ceFEPime (MAXIPIME) IV  1 g Intravenous Q24H  . clopidogrel  75 mg Oral Daily  . enoxaparin (LOVENOX) injection  30 mg Subcutaneous Q24H  . feeding supplement (ENSURE ENLIVE)  237 mL Oral BID BM  . metoprolol succinate  25 mg Oral Daily  . multivitamin with minerals  1 tablet Oral Daily  . oseltamivir  30 mg  Oral Daily  . potassium chloride  20 mEq Oral Daily  . saccharomyces boulardii  250 mg Oral BID  . sodium chloride flush  3 mL Intravenous Q12H  . vancomycin  125 mg Oral QODAY   Continuous Infusions: . sodium chloride 0.9 % 1,000 mL infusion      Principal Problem:   Sepsis ([place]) Active Problems:   Essential hypertension   C. difficile colitis   CKD (chronic kidney disease) stage 3, GFR 30-59 ml/min   Acute renal failure (ARF) ([place])   AKI (acute kidney injury) ([place])   Palliative care encounter   Advanced care planning/counseling discussion   Acute kidney injury superimposed on CKD ([place])   HCAP (healthcare-associated pneumonia)   Time spent 25 minutes   By signing my name below, I, Delene Ruffini, attest that this documentation has been prepared under the direction and in the presence of Palak Tercero P. Sarajane Jews, MD. Electronically Signed: Delene Ruffini, Scribe.  01/29/2016  I personally performed the services described in this documentation. All medical record entries made by the scribe were at my direction. I have reviewed the chart and agree that the record reflects my personal performance and is accurate and complete. Murray Hodgkins, MD

## 2016-01-30 DIAGNOSIS — J9601 Acute respiratory failure with hypoxia: Secondary | ICD-10-CM

## 2016-01-30 MED ORDER — CEFUROXIME AXETIL 250 MG PO TABS
500.0000 mg | ORAL_TABLET | Freq: Two times a day (BID) | ORAL | Status: DC
  Administered 2016-01-30 – 2016-01-31 (×2): 500 mg via ORAL
  Filled 2016-01-30 (×3): qty 2

## 2016-01-30 NOTE — Progress Notes (Signed)
PROGRESS NOTE  Elizabeth Oneal [id number] DOB: [birth year] DOA: 01/27/2016 PCP: Vernell Morgans, MD  Summary: 82 -year-old woman just discharged February 16, treated for cc C. difficile colitis with sepsis, acute hypoxic respiratory failure, NSTEMI, new systolic heart failure who presented 3/15 from [facility] with fever, chills and shortness of breath. Found to be tachycardic and febrile with hypoxic respiratory failure. Admitted for sepsis, HCAP, AKI,   Assessment/Plan: 1. Sepsis, resolved. Secondary to HCAP chest x-ray unrevealing. Blood and urine cultures unremarkable. Started empirically on Tamiflu. Influenza PCR negative. Minimal leukocytosis. Hemodynamics stable. Hypoxia stable. 2. Acute hypoxic respiratory failure.  3. AKI superimposed on CKD stage III. Resolved. Likely secondary to prerenal azotemia.  4. C diff colitis treated PTA. Asymptomatic. Pt was on Vanc taper from last hospitalization. Protonix discontinued. No diarrhea.  5. Chronic systolic CHF, dx 99991111. Ischemic versus Takotsubo CM. LVEF 30-35%. Appears stable. 6. PAF 7. PMH NSTEMI 12/2015. 8. Dementia. Stable.   Much improved. Will transition to oral abx today.   Return to [facility] 3/19  Code Status: DNR DVT prophylaxis: Lovenox Family Communication: none present Disposition Plan: Discharge back to SNF once improved  Murray Hodgkins, MD  [facility]  Pager [phone] If 7PM-7AM, please contact night-coverage at [email or web address], password [facility] 01/30/2016, 7:53 AM  LOS: 3 days   Consultants:  Palliative care  Procedures:  none  Antibiotics:  IV Vanc 3/15 >> 3/16  Cefepime 3/15 >>  HPI/Subjective: Feeling ok. No reports of chest pain, shortness of breath, n/v/d, or abd pain. Denies any diarrhea.   Objective: Filed Vitals:   01/29/16 1450 01/29/16 1946 01/30/16 0000 01/30/16 0500  BP: 118/56 126/45 136/51 136/45  Pulse: 102 86 84 79  Temp: 98.4 F (36.9 C) 98.5 F (36.9 C) 97.5 F (36.4 C) 97.6 F  (36.4 C)  TempSrc: Axillary Axillary Oral Oral  Resp: 16 16 16 18   Height:      Weight:      SpO2: 93% 100% 100% 99%    Intake/Output Summary (Last 24 hours) at 01/30/16 0753 Last data filed at 01/29/16 1730  Gross per 24 hour  Intake    240 ml  Output      0 ml  Net    240 ml     Filed Weights   01/27/16 1937 01/27/16 2249  Weight: 72.576 kg (160 lb) 62.052 kg (136 lb 12.8 oz)    Exam:  General:  Appears calm and comfortable ENT: Somewhat hard of hearing. Cardiovascular: RRR, no m/r/g. No LE edema. Respiratory: CTA bilaterally, no w/r/r. Normal respiratory effort. Abdomen: soft, ntnd Musculoskeletal: grossly normal tone BUE/BLE Psychiatric: Confusion appears resolved at this point. Answers are appropriate. She follows commands.  New data reviewed:  None  Scheduled Meds: . antiseptic oral rinse  7 mL Mouth Rinse BID  . aspirin EC  81 mg Oral Daily  . budesonide  3 mg Oral Daily  . ceFEPime (MAXIPIME) IV  1 g Intravenous Q24H  . clopidogrel  75 mg Oral Daily  . enoxaparin (LOVENOX) injection  30 mg Subcutaneous Q24H  . feeding supplement (ENSURE ENLIVE)  237 mL Oral BID BM  . metoprolol succinate  25 mg Oral Daily  . multivitamin with minerals  1 tablet Oral Daily  . oseltamivir  30 mg Oral Daily  . potassium chloride  20 mEq Oral Daily  . saccharomyces boulardii  250 mg Oral BID  . sodium chloride flush  3 mL Intravenous Q12H  . vancomycin  125 mg Oral QODAY  Continuous Infusions: . sodium chloride 0.9 % 1,000 mL infusion      Principal Problem:   Sepsis ([place]) Active Problems:   Essential hypertension   C. difficile colitis   CKD (chronic kidney disease) stage 3, GFR 30-59 ml/min   Acute respiratory failure with hypoxia (HCC)   AKI (acute kidney injury) ([place])   Palliative care encounter   Advanced care planning/counseling discussion   Acute kidney injury superimposed on CKD ([place])   HCAP (healthcare-associated pneumonia)   Time spent 15  minutes   By signing my name below, I, Rennis Harding attest that this documentation has been prepared under the direction and in the presence of Murray Hodgkins, MD Electronically signed: Rennis Harding  01/30/2016 12:26pm  I personally performed the services described in this documentation. All medical record entries made by the scribe were at my direction. I have reviewed the chart and agree that the record reflects my personal performance and is accurate and complete. Murray Hodgkins, MD

## 2016-01-30 NOTE — Discharge Summary (Signed)
Physician Discharge Summary  Elizabeth Oneal [id number] DOB: 1916-04-27 DOA: 01/27/2016  PCP: Vernell Morgans, MD  Admit date: 01/27/2016 Discharge date: 01/31/2016  Recommendations for Outpatient Follow-up:   Follow-up resolution of pneumonia  Follow-up resolution of C. difficile colitis present on admission  PPI discontinued given C. Difficile  Gastroenterology recommended discontinuing budesonide on last admission   Follow-up Information    Follow up with ARIZA,FERNANDO, MD. Schedule an appointment as soon as possible for a visit in 1 week.   Specialty:  Internal Medicine   Contact information:   [address] [place] [address] [phone]      Discharge Diagnoses:  1. Sepsis 2. HCAP 3. Acute hypoxic respiratory failure 4. AKI 5. C diff colitis 6. Chronic systolic CHF 7. Dementia   Discharge Condition: Improved  Disposition: Discharge to SNF  Diet recommendation: Heart healthy   Filed Weights   01/27/16 1937 01/27/16 2249  Weight: 72.576 kg (160 lb) 62.052 kg (136 lb 12.8 oz)    History of present illness:  82 -year-old woman just discharged February 16, treated for cc C. difficile colitis with sepsis, acute hypoxic respiratory failure, NSTEMI, new systolic heart failure who presented 3/15 from [facility] with fever, chills and shortness of breath. Found to be tachycardic and febrile with hypoxic respiratory failure. Admitted for sepsis, HCAP, AKI.  Hospital Course:  Admitted for sepsis with symptomatology most suggestive of pneumonia although chest x-ray unrevealing. Rapidly improved with empiric antibiotics. Also started empirically on Tamiflu although influenza PCR negative. Hypoxia has resolved. Hospitalization uncomplicated. Individual issues as below.  1. Sepsis, resolved. Secondary to HCAP clinically although chest x-ray unrevealing. tarted empirically on Tamiflu. Influenza PCR negative.  2. HCAP, clinically resolved 3. Acute hypoxic respiratory  failure. Resolved. 4. AKI superimposed on CKD stage III. Resolved. Secondary to prerenal azotemia.  5. C diff colitis treated PTA. Onn Vanc taper from last hospitalization. PPI discontinued. Stable. 6. Chronic systolic CHF, dx 99991111. Ischemic versus Takotsubo CM. LVEF 30-35%. Remains stable. 7. PAF 8. PMH NSTEMI 12/2015. 9. Dementia. Stable.   Consultants:  Palliative care  Procedures:  none   Discharge Instructions   Current Discharge Medication List    START taking these medications   Details  cefUROXime (CEFTIN) 500 MG tablet Take 1 tablet (500 mg total) by mouth 2 (two) times daily with a meal. Last dose 3/20 AM    oseltamivir (TAMIFLU) 30 MG capsule Take 1 capsule (30 mg total) by mouth daily. Last dose 3/20      CONTINUE these medications which have NOT CHANGED   Details  acetaminophen (TYLENOL) 325 MG tablet Take 2 tablets (650 mg total) by mouth every 4 (four) hours as needed for headache or mild pain.    aspirin EC 81 MG EC tablet Take 1 tablet (81 mg total) by mouth daily.    clopidogrel (PLAVIX) 75 MG tablet Take 1 tablet (75 mg total) by mouth daily.    furosemide (LASIX) 20 MG tablet Take 1 tablet (20 mg total) by mouth daily.    lisinopril (PRINIVIL,ZESTRIL) 2.5 MG tablet Take 1 tablet (2.5 mg total) by mouth daily.    Melatonin 3 MG TABS Take 1 tablet by mouth at bedtime.    metoprolol succinate (TOPROL-XL) 25 MG 24 hr tablet Take 1 tablet (25 mg total) by mouth daily.    Multiple Vitamin (MULTIVITAMIN) tablet Take 1 tablet by mouth daily.    nitroGLYCERIN (NITROSTAT) 0.4 MG SL tablet Place 1 tablet (0.4 mg total) under the tongue every 5 (  five) minutes x 3 doses as needed for chest pain.    potassium chloride (KLOR-CON) 20 MEQ packet Take 20 mEq by mouth daily.    sertraline (ZOLOFT) 25 MG tablet Take 25 mg by mouth daily.    vancomycin (VANCOCIN) 50 mg/mL oral solution 125 mg PO QID x 8 days, then 125 mg PO BID x7 days, then 125 mg PO daily x7  days, then 125 mg PO every other day x 7days, then 125 mg PO every 3 days, then stop.      STOP taking these medications     budesonide (ENTOCORT EC) 3 MG 24 hr capsule      omeprazole (PRILOSEC) 20 MG capsule        Allergies  Allergen Reactions  . Celebrex [Celecoxib]   . Codeine Other (See Comments)    Goes out of right state of mind.   . Lipitor [Atorvastatin]   . Morphine And Related   . Prozac [Fluoxetine Hcl]   . Vioxx [Rofecoxib]   . Xanax [Alprazolam] Other (See Comments)    Goes out of right state of mind.     The results of significant diagnostics from this hospitalization (including imaging, microbiology, ancillary and laboratory) are listed below for reference.    Significant Diagnostic Studies: Dg Chest Port 1 View  01/27/2016  CLINICAL DATA:  Acute onset of fever and high blood pressure. Mild cough. Initial encounter. EXAM: PORTABLE CHEST 1 VIEW COMPARISON:  Chest radiograph performed 12/31/2015 FINDINGS: The lungs are hypoexpanded. Vascular crowding and vascular congestion are seen. Peribronchial thickening is noted. Mild bilateral opacities likely reflect atelectasis. No definite pleural effusion or pneumothorax is seen. The cardiomediastinal silhouette is normal in size. No acute osseous abnormalities are identified. IMPRESSION: Lungs hypoexpanded. Vascular congestion noted. Peribronchial thickening noted. Mild bilateral opacities likely reflect atelectasis. Electronically Signed   By: Garald Balding M.D.   On: 01/27/2016 18:33    Microbiology: Recent Results (from the past 240 hour(s))  Urine culture     Status: None   Collection Time: 01/27/16  6:30 PM  Result Value Ref Range Status   Specimen Description URINE, CATHETERIZED  Final   Special Requests NONE  Final   Culture   Final    NO GROWTH 2 DAYS Performed at [facility]    Report Status 01/29/2016 FINAL  Final  Blood Culture (routine x 2)     Status: None (Preliminary result)   Collection  Time: 01/27/16  6:48 PM  Result Value Ref Range Status   Specimen Description BLOOD RIGHT HAND  Final   Special Requests   Final    BOTTLES DRAWN AEROBIC AND ANAEROBIC AEB=6CC ANA=4CC   Culture NO GROWTH 4 DAYS  Final   Report Status PENDING  Incomplete  Blood Culture (routine x 2)     Status: None (Preliminary result)   Collection Time: 01/27/16  6:54 PM  Result Value Ref Range Status   Specimen Description BLOOD RIGHT ARM  Final   Special Requests BOTTLES DRAWN AEROBIC AND ANAEROBIC 8CC EACH  Final   Culture NO GROWTH 4 DAYS  Final   Report Status PENDING  Incomplete  MRSA PCR Screening     Status: None   Collection Time: 01/28/16 12:15 AM  Result Value Ref Range Status   MRSA by PCR NEGATIVE NEGATIVE Final    Comment:        The GeneXpert MRSA Assay (FDA approved for NASAL specimens only), is one component of a comprehensive MRSA colonization surveillance  program. It is not intended to diagnose MRSA infection nor to guide or monitor treatment for MRSA infections.      Labs: Basic Metabolic Panel:  Recent Labs Lab 01/27/16 1839 01/28/16 0200 01/29/16 0531  NA 141 144 145  K 3.8 3.5 3.6  CL 96* 103 108  CO2 36* 33* 30  GLUCOSE 129* 107* 101*  BUN 35* 34* 28*  CREATININE 2.55* 2.11* 1.15*  CALCIUM 8.2* 7.7* 8.1*   Liver Function Tests:  Recent Labs Lab 01/27/16 1839  AST 29  ALT 14  ALKPHOS 59  BILITOT 0.9  PROT 6.5  ALBUMIN 2.9*   CBC:  Recent Labs Lab 01/27/16 1839 01/28/16 0200 01/28/16 1636  WBC 15.9* 11.1* 11.9*  NEUTROABS 13.3*  --   --   HGB 12.2 10.8* 11.2*  HCT 39.6 34.4* 36.1  MCV 91.5 91.7 91.9  PLT 295 235 232    Principal Problem:   Sepsis ([place]) Active Problems:   Essential hypertension   C. difficile colitis   CKD (chronic kidney disease) stage 3, GFR 30-59 ml/min   Acute respiratory failure with hypoxia (HCC)   AKI (acute kidney injury) ([place])   Palliative care encounter   Advanced care planning/counseling discussion    Acute kidney injury superimposed on CKD ([place])   HCAP (healthcare-associated pneumonia)   Time coordinating discharge: 35 minutes  Signed:  Murray Hodgkins, MD [facility] 01/31/2016, 10:56 AM    By signing my name below, I, Rennis Harding attest that this documentation has been prepared under the direction and in the presence of Murray Hodgkins, MD Electronically signed: Rennis Harding  01/31/2016 10:05 am  I personally performed the services described in this documentation. All medical record entries made by the scribe were at my direction. I have reviewed the chart and agree that the record reflects my personal performance and is accurate and complete. Murray Hodgkins, MD

## 2016-01-30 NOTE — Progress Notes (Signed)
Pharmacy Antibiotic Note  Elizabeth Oneal is a 82 y.o. female admitted on 01/27/2016 with pneumonia.  Pharmacy has been consulted for cefepime dosing.  Plan: Cont cefepime 1gm IV q24 hours  Height: 5\' 4"  (162.6 cm) Weight: 136 lb 12.8 oz (62.052 kg) IBW/kg (Calculated) : 54.7  Temp (24hrs), Avg:98 F (36.7 C), Min:97.5 F (36.4 C), Max:98.5 F (36.9 C)   Recent Labs Lab 01/27/16 1839 01/27/16 1844 01/27/16 2312 01/28/16 0200 01/28/16 1636 01/29/16 0531  WBC 15.9*  --   --  11.1* 11.9*  --   CREATININE 2.55*  --   --  2.11*  --  1.15*  LATICACIDVEN  --  2.04* 1.4 1.1  --   --     Estimated Creatinine Clearance: 23.6 mL/min (by C-G formula based on Cr of 1.15).    Allergies  Allergen Reactions  . Celebrex [Celecoxib]   . Codeine Other (See Comments)    Goes out of right state of mind.   . Lipitor [Atorvastatin]   . Morphine And Related   . Prozac [Fluoxetine Hcl]   . Vioxx [Rofecoxib]   . Xanax [Alprazolam] Other (See Comments)    Goes out of right state of mind.     Antimicrobials this admission: cefepime 3/15 >>  Vancomycin po  >> taper cont from home  Microbiology results: 3/15 BCx: ngtd 3/15 UCx: ng final   3/16 MRSA PCR: (-)  Thank you for allowing pharmacy to be a part of this patient's care.  Excell Seltzer Poteet 01/30/2016 10:04 AM

## 2016-01-31 MED ORDER — CEFUROXIME AXETIL 500 MG PO TABS: 500.0000 mg | ORAL_TABLET | Freq: Two times a day (BID) | ORAL | Status: DC

## 2016-01-31 MED ORDER — OSELTAMIVIR PHOSPHATE 30 MG PO CAPS: 30.0000 mg | ORAL_CAPSULE | Freq: Every day | ORAL | Status: DC

## 2016-01-31 NOTE — Progress Notes (Signed)
PROGRESS NOTE  Elizabeth Oneal [id number] DOB: 1915-12-16 DOA: 01/27/2016 PCP: Vernell Morgans, MD  Summary: 82 -year-old woman just discharged February 16, treated for cc C. difficile colitis with sepsis, acute hypoxic respiratory failure, NSTEMI, new systolic heart failure who presented 3/15 from [facility] with fever, chills and shortness of breath. Found to be tachycardic and febrile with hypoxic respiratory failure. Admitted for sepsis, HCAP, AKI,   Assessment/Plan: 1. Sepsis, resolved. Secondary to HCAP clinically although chest x-ray unrevealing. tarted empirically on Tamiflu. Influenza PCR negative.  2. HCAP, clinically resolved 3. Acute hypoxic respiratory failure. Resolved. 4. AKI superimposed on CKD stage III. Resolved. Secondary to prerenal azotemia.  5. C diff colitis treated PTA. Onn Vanc taper from last hospitalization. PPI discontinued. Stable. 6. Chronic systolic CHF, dx 99991111. Ischemic versus Takotsubo CM. LVEF 30-35%. Remains stable. 7. PAF 8. PMH NSTEMI 12/2015. 9. Dementia. Stable.   Continues to improve, hypoxia resolved, afebrile.  Transfer back to skilled nursing facility today.  Murray Hodgkins, MD  [facility]  Pager [phone] If 7PM-7AM, please contact night-coverage at [email or web address], password [facility] 01/31/2016, 7:24 AM  LOS: 4 days   Consultants:  Palliative care  Procedures:  none  Antibiotics:  IV Vanc 3/15 >> 3/16  Cefepime 3/15 >>3/18  Ceftin 3/18>>3/19  HPI/Subjective: Patient is feeling well and has no pain. No reports of chest pain, shortness of breath, n/v/, or abd pain. One episode of diarrhea.  Objective: Filed Vitals:   01/30/16 1109 01/30/16 1528 01/30/16 2204 01/31/16 0500  BP: 144/114 132/50 129/51 170/47  Pulse: 83 80 86 81  Temp:  98 F (36.7 C) 97.9 F (36.6 C) 98 F (36.7 C)  TempSrc:  Oral Axillary Oral  Resp:  18 18 118  Height:      Weight:      SpO2:  100% 100% 99%    Intake/Output Summary (Last 24  hours) at 01/31/16 0724 Last data filed at 01/30/16 1838  Gross per 24 hour  Intake    243 ml  Output      0 ml  Net    243 ml     Filed Weights   01/27/16 1937 01/27/16 2249  Weight: 72.576 kg (160 lb) 62.052 kg (136 lb 12.8 oz)    Exam:  Afebrile, VSS General:  Appears calm and comfortable. Lying in bed Cardiovascular: RRR, no m/r/g. No LE edema. Respiratory: CTA bilaterally, no w/r/r. Normal respiratory effort. Abdomen: soft, ntnd Psychiatric: grossly normal mood and affect, speech fluent and appropriate  New data reviewed:  None  Scheduled Meds: . antiseptic oral rinse  7 mL Mouth Rinse BID  . aspirin EC  81 mg Oral Daily  . budesonide  3 mg Oral Daily  . cefUROXime  500 mg Oral BID WC  . clopidogrel  75 mg Oral Daily  . enoxaparin (LOVENOX) injection  30 mg Subcutaneous Q24H  . feeding supplement (ENSURE ENLIVE)  237 mL Oral BID BM  . metoprolol succinate  25 mg Oral Daily  . multivitamin with minerals  1 tablet Oral Daily  . oseltamivir  30 mg Oral Daily  . potassium chloride  20 mEq Oral Daily  . saccharomyces boulardii  250 mg Oral BID  . sodium chloride flush  3 mL Intravenous Q12H  . vancomycin  125 mg Oral QODAY   Continuous Infusions: . sodium chloride 0.9 % 1,000 mL infusion      Principal Problem:   Sepsis ([place]) Active Problems:   Essential hypertension   C. difficile colitis  CKD (chronic kidney disease) stage 3, GFR 30-59 ml/min   Acute respiratory failure with hypoxia (HCC)   AKI (acute kidney injury) [facility])   Palliative care encounter   Advanced care planning/counseling discussion   Acute kidney injury superimposed on CKD ([place])   HCAP (healthcare-associated pneumonia)     By signing my name below, I, Rennis Harding attest that this documentation has been prepared under the direction and in the presence of Murray Hodgkins, MD Electronically signed: Rennis Harding  01/31/2016 10:05 am  I personally performed the services described  in this documentation. All medical record entries made by the scribe were at my direction. I have reviewed the chart and agree that the record reflects my personal performance and is accurate and complete. Murray Hodgkins, MD

## 2016-01-31 NOTE — Progress Notes (Signed)
Pt's IV catheter removed and intact. Pt's IV site clean dry and intact. Pt in stable condition and in no acute distress at time of discharge. Report called and given to [facility]. Pt will be transported by [facility].

## 2016-02-01 LAB — CULTURE, BLOOD (ROUTINE X 2)
Culture: NO GROWTH
Culture: NO GROWTH

## 2016-02-04 ENCOUNTER — Encounter: Payer: Self-pay | Admitting: Adult Health

## 2016-02-04 ENCOUNTER — Ambulatory Visit (INDEPENDENT_AMBULATORY_CARE_PROVIDER_SITE_OTHER): Payer: Medicare Other | Admitting: Adult Health

## 2016-02-04 VITALS — BP 128/72 | HR 94 | Ht 67.0 in | Wt 158.0 lb

## 2016-02-04 DIAGNOSIS — I5032 Chronic diastolic (congestive) heart failure: Secondary | ICD-10-CM | POA: Diagnosis not present

## 2016-02-04 DIAGNOSIS — R000 Tachycardia, unspecified: Secondary | ICD-10-CM | POA: Diagnosis not present

## 2016-02-04 DIAGNOSIS — I10 Essential (primary) hypertension: Secondary | ICD-10-CM | POA: Diagnosis not present

## 2016-02-04 MED ORDER — METOPROLOL TARTRATE 25 MG PO TABS: 12.5000 mg | ORAL_TABLET | Freq: Two times a day (BID) | ORAL | Status: DC

## 2016-02-04 NOTE — Patient Instructions (Signed)
Medication Instructions:  START METOPROLOL 12.5 MG TWO TIMES DAILY   Labwork: NONE  Testing/Procedures: NONE  Follow-Up: Your physician recommends that you schedule a follow-up appointment in: 1 MONTH    Any Other Special Instructions Will Be Listed Below (If Applicable).     If you need a refill on your cardiac medications before your next appointment, please call your pharmacy.

## 2016-02-04 NOTE — Progress Notes (Signed)
Cardiology Office Note   Date:  02/04/2016   ID:  Elizabeth Oneal, [place] May 21, 1916, MRN [id number]  PCP:  Vernell Morgans, MD  Cardiologist:  Jory Sims, NP   Chief Complaint  Patient presents with  . Shortness of Breath  . Tachycardia      History of Present Illness: Elizabeth Oneal is a 82 y.o. female who presents for ongoing assessment and management of chronic systolic heart failure, with recent hospitalization for sepsis, healthcare, acquired pneumonia, acute hypoxic respiratory failure, and acute kidney injury. She was treated with Tamiflu, she was also treated for C. Difficile.  Colitis.  Echocardiogram on 2 2017 revealed ischemic versus [place] cardiomyopathy with EF of 30-35%, and found to be stable.  She was referred to palliative care.  She is feeling better since hospitalization. She having fatigue. Is in a wheelchair. She continues to have some coughing.   Past Medical History  Diagnosis Date  . Hyperlipidemia   . Essential hypertension   . Degenerative disc disease, lumbar   . C. difficile colitis   . Reflux   . Hiatal hernia   . History of pneumonia     Past Surgical History  Procedure Laterality Date  . Abdominal hysterectomy    . Cholecystectomy    . Appendectomy    . Abdominal surgery    . Fracture surgery    . Cataract surgery    . Joint replacement    . Colon surgery  2009     Current Outpatient Prescriptions  Medication Sig Dispense Refill  . budesonide (ENTOCORT EC) 3 MG 24 hr capsule Take by mouth daily.    . potassium chloride (KLOR-CON) 20 MEQ packet Take 20 mEq by mouth daily.    Marland Kitchen acetaminophen (TYLENOL) 325 MG tablet Take 2 tablets (650 mg total) by mouth every 4 (four) hours as needed for headache or mild pain. (Patient not taking: Reported on 02/04/2016)    . aspirin EC 81 MG EC tablet Take 1 tablet (81 mg total) by mouth daily. (Patient not taking: Reported on 02/04/2016)    . cefUROXime (CEFTIN) 500 MG tablet Take 1 tablet (500 mg total)  by mouth 2 (two) times daily with a meal. Last dose 3/20 AM (Patient not taking: Reported on 02/04/2016)    . clopidogrel (PLAVIX) 75 MG tablet Take 1 tablet (75 mg total) by mouth daily. (Patient not taking: Reported on 02/04/2016)    . furosemide (LASIX) 20 MG tablet Take 1 tablet (20 mg total) by mouth daily. (Patient not taking: Reported on 02/04/2016)    . lisinopril (PRINIVIL,ZESTRIL) 2.5 MG tablet Take 1 tablet (2.5 mg total) by mouth daily. (Patient not taking: Reported on 02/04/2016)    . Melatonin 3 MG TABS Take 1 tablet by mouth at bedtime. Reported on 02/04/2016    . metoprolol succinate (TOPROL-XL) 25 MG 24 hr tablet Take 1 tablet (25 mg total) by mouth daily. (Patient not taking: Reported on 02/04/2016)    . Multiple Vitamin (MULTIVITAMIN) tablet Take 1 tablet by mouth daily. Reported on 02/04/2016    . nitroGLYCERIN (NITROSTAT) 0.4 MG SL tablet Place 1 tablet (0.4 mg total) under the tongue every 5 (five) minutes x 3 doses as needed for chest pain. (Patient not taking: Reported on 02/04/2016)    . oseltamivir (TAMIFLU) 30 MG capsule Take 1 capsule (30 mg total) by mouth daily. Last dose 3/20 (Patient not taking: Reported on 02/04/2016)    . sertraline (ZOLOFT) 25 MG tablet Take 25 mg by mouth  daily. Reported on 02/04/2016    . vancomycin (VANCOCIN) 50 mg/mL oral solution 125 mg PO QID x 8 days, then 125 mg PO BID x7 days, then 125 mg PO daily x7 days, then 125 mg PO every other day x 7days, then 125 mg PO every 3 days, then stop. (Patient not taking: Reported on 02/04/2016)     No current facility-administered medications for this visit.    Allergies:   Celebrex; Codeine; Lipitor; Morphine and related; Prozac; Vioxx; and Xanax    Social History:  The patient  reports that she has never smoked. She does not have any smokeless tobacco history on file. She reports that she does not drink alcohol or use illicit drugs.   Family History:  The patient's family history includes Heart disease in her  sister.    ROS: All other systems are reviewed and negative. Unless otherwise mentioned in H&P    PHYSICAL EXAM: VS:  BP 128/72 mmHg  Pulse 94  Ht 5\' 7"  (1.702 m)  Wt 158 lb (71.668 kg)  BMI 24.74 kg/m2  SpO2 94% , BMI Body mass index is 24.74 kg/(m^2). GEN: Well nourished, well developed, in no acute distress HEENT: normal Neck: no JVD, carotid bruits, or masses Cardiac: RRR; tachycardic,  no murmurs, rubs, or gallops,no edema  Respiratory:  Clear to auscultation bilaterally upper lobes. Crackles in the bases.  GI: soft, nontender, nondistended, + BS MS: no deformity or atrophy Skin: warm and dry, no rash Neuro:  Strength and sensation are intact Psych: euthymic mood, full affect   Recent Labs: 12/30/2015: Magnesium 1.8; TSH 1.404 01/27/2016: ALT 14 01/28/2016: Hemoglobin 11.2*; Platelets 232 01/29/2016: BUN 28*; Creatinine, Ser 1.15*; Potassium 3.6; Sodium 145    Lipid Panel    Component Value Date/Time   CHOL 203* 03/19/2015 1031   CHOL 198 03/04/2013 1535   TRIG 146 03/19/2015 1031   HDL 55 03/19/2015 1031   HDL 46 03/04/2013 1535   CHOLHDL 3.7 03/19/2015 1031   CHOLHDL 4.3 03/04/2013 1535   VLDL 47* 03/04/2013 1535   LDLCALC 119* 03/19/2015 1031   LDLCALC 105* 03/04/2013 1535      Wt Readings from Last 3 Encounters:  02/04/16 158 lb (71.668 kg)  01/27/16 136 lb 12.8 oz (62.052 kg)  12/29/15 160 lb 4.4 oz (72.7 kg)        ASSESSMENT AND PLAN:  1. Chronic Diastolic CHF: No evidence of fluid overload. She continues fatigued and significantly deconditioned. Wheel chair bound, but no dependent edema. Wt is stable   2. Tachycardia: Has been taken off metoprolol. Will restart at 12.5 mg BID for resting tachycardia. Will see again in one month. No evidence of dehydration on diuretic.   3. Hypertension: Soft today. May need to stop lisinopril if addition of metoprolol at low dose for tachycardia causes decrease.    Current medicines are reviewed at length  with the patient today.    Labs/ tests ordered today include:  No orders of the defined types were placed in this encounter.     Disposition:   FU with 1 month.    Signed, Jory Sims, NP  02/04/2016 2:58 PM    [facility] [address] [place], [place] [address] Phone: [phone]; Fax: [phone]

## 2016-02-04 NOTE — Progress Notes (Deleted)
Name: Elizabeth Oneal    DOB: [birth year]  Age: 82 y.o.  MR#: [id number]       PCP:  Vernell Morgans, MD      Insurance: Payor: MEDICARE / Plan: MEDICARE PART A AND B / Product Type: *No Product type* /   CC:   No chief complaint on file.   VS Filed Vitals:   02/04/16 1427  BP: 128/72  Pulse: 94  Height: 5\' 7"  (1.702 m)  Weight: 158 lb (71.668 kg)  SpO2: 94%    Weights Current Weight  02/04/16 158 lb (71.668 kg)  01/27/16 136 lb 12.8 oz (62.052 kg)  12/29/15 160 lb 4.4 oz (72.7 kg)    Blood Pressure  BP Readings from Last 3 Encounters:  02/04/16 128/72  01/31/16 146/50  01/01/16 130/63     Admit date:  (Not on file) Last encounter with RMR:  Visit date not found   Allergy Celebrex; Codeine; Lipitor; Morphine and related; Prozac; Vioxx; and Xanax  Current Outpatient Prescriptions  Medication Sig Dispense Refill  . budesonide (ENTOCORT EC) 3 MG 24 hr capsule Take by mouth daily.    . potassium chloride (KLOR-CON) 20 MEQ packet Take 20 mEq by mouth daily.    Marland Kitchen acetaminophen (TYLENOL) 325 MG tablet Take 2 tablets (650 mg total) by mouth every 4 (four) hours as needed for headache or mild pain. (Patient not taking: Reported on 02/04/2016)    . aspirin EC 81 MG EC tablet Take 1 tablet (81 mg total) by mouth daily. (Patient not taking: Reported on 02/04/2016)    . cefUROXime (CEFTIN) 500 MG tablet Take 1 tablet (500 mg total) by mouth 2 (two) times daily with a meal. Last dose 3/20 AM (Patient not taking: Reported on 02/04/2016)    . clopidogrel (PLAVIX) 75 MG tablet Take 1 tablet (75 mg total) by mouth daily. (Patient not taking: Reported on 02/04/2016)    . furosemide (LASIX) 20 MG tablet Take 1 tablet (20 mg total) by mouth daily. (Patient not taking: Reported on 02/04/2016)    . lisinopril (PRINIVIL,ZESTRIL) 2.5 MG tablet Take 1 tablet (2.5 mg total) by mouth daily. (Patient not taking: Reported on 02/04/2016)    . Melatonin 3 MG TABS Take 1 tablet by mouth at bedtime. Reported on  02/04/2016    . metoprolol succinate (TOPROL-XL) 25 MG 24 hr tablet Take 1 tablet (25 mg total) by mouth daily. (Patient not taking: Reported on 02/04/2016)    . Multiple Vitamin (MULTIVITAMIN) tablet Take 1 tablet by mouth daily. Reported on 02/04/2016    . nitroGLYCERIN (NITROSTAT) 0.4 MG SL tablet Place 1 tablet (0.4 mg total) under the tongue every 5 (five) minutes x 3 doses as needed for chest pain. (Patient not taking: Reported on 02/04/2016)    . oseltamivir (TAMIFLU) 30 MG capsule Take 1 capsule (30 mg total) by mouth daily. Last dose 3/20 (Patient not taking: Reported on 02/04/2016)    . sertraline (ZOLOFT) 25 MG tablet Take 25 mg by mouth daily. Reported on 02/04/2016    . vancomycin (VANCOCIN) 50 mg/mL oral solution 125 mg PO QID x 8 days, then 125 mg PO BID x7 days, then 125 mg PO daily x7 days, then 125 mg PO every other day x 7days, then 125 mg PO every 3 days, then stop. (Patient not taking: Reported on 02/04/2016)     No current facility-administered medications for this visit.    Discontinued Meds:   There are no discontinued medications.  Patient Active Problem  List   Diagnosis Date Noted  . Palliative care encounter   . Advanced care planning/counseling discussion   . Acute kidney injury superimposed on CKD ([place])   . HCAP (healthcare-associated pneumonia)   . AKI (acute kidney injury) ([place]) 01/27/2016  . Cough   . Paroxysmal atrial fibrillation ([place]) 12/29/2015  . C. difficile colitis 12/26/2015  . Sepsis ([place]) 12/26/2015  . NSTEMI (non-ST elevated myocardial infarction) ([place]) 12/26/2015  . Hypokalemia 12/26/2015  . CKD (chronic kidney disease) stage 3, GFR 30-59 ml/min 12/26/2015  . Acute respiratory failure with hypoxia ([place]) 12/26/2015  . History of depression 03/22/2015  . Frailty 03/22/2015  . PSEUDOMEMBRANOUS COLITIS 02/03/2009  . Hyperlipidemia LDL goal <130 02/03/2009  . Essential hypertension 02/03/2009  . SCHATZKI'S RING 02/03/2009  . GERD 02/03/2009  .  COLITIS 02/03/2009  . DYSPHAGIA UNSPECIFIED 02/03/2009  . DIARRHEA 02/03/2009  . DIVERTICULITIS, HX OF 02/03/2009    LABS    Component Value Date/Time   NA 145 01/29/2016 0531   NA 144 01/28/2016 0200   NA 141 01/27/2016 1839   NA 135 03/19/2015 1031   K 3.6 01/29/2016 0531   K 3.5 01/28/2016 0200   K 3.8 01/27/2016 1839   CL 108 01/29/2016 0531   CL 103 01/28/2016 0200   CL 96* 01/27/2016 1839   CO2 30 01/29/2016 0531   CO2 33* 01/28/2016 0200   CO2 36* 01/27/2016 1839   GLUCOSE 101* 01/29/2016 0531   GLUCOSE 107* 01/28/2016 0200   GLUCOSE 129* 01/27/2016 1839   GLUCOSE 114* 03/19/2015 1031   BUN 28* 01/29/2016 0531   BUN 34* 01/28/2016 0200   BUN 35* 01/27/2016 1839   BUN 29 03/19/2015 1031   CREATININE 1.15* 01/29/2016 0531   CREATININE 2.11* 01/28/2016 0200   CREATININE 2.55* 01/27/2016 1839   CREATININE 1.49* 03/04/2013 1535   CALCIUM 8.1* 01/29/2016 0531   CALCIUM 7.7* 01/28/2016 0200   CALCIUM 8.2* 01/27/2016 1839   GFRNONAA 38* 01/29/2016 0531   GFRNONAA 18* 01/28/2016 0200   GFRNONAA 15* 01/27/2016 1839   GFRAA 44* 01/29/2016 0531   GFRAA 21* 01/28/2016 0200   GFRAA 17* 01/27/2016 1839   CMP     Component Value Date/Time   NA 145 01/29/2016 0531   NA 135 03/19/2015 1031   K 3.6 01/29/2016 0531   CL 108 01/29/2016 0531   CO2 30 01/29/2016 0531   GLUCOSE 101* 01/29/2016 0531   GLUCOSE 114* 03/19/2015 1031   BUN 28* 01/29/2016 0531   BUN 29 03/19/2015 1031   CREATININE 1.15* 01/29/2016 0531   CREATININE 1.49* 03/04/2013 1535   CALCIUM 8.1* 01/29/2016 0531   PROT 6.5 01/27/2016 1839   PROT 6.8 03/19/2015 1031   ALBUMIN 2.9* 01/27/2016 1839   ALBUMIN 4.4 03/19/2015 1031   AST 29 01/27/2016 1839   ALT 14 01/27/2016 1839   ALKPHOS 59 01/27/2016 1839   BILITOT 0.9 01/27/2016 1839   BILITOT 0.5 03/19/2015 1031   GFRNONAA 38* 01/29/2016 0531   GFRAA 44* 01/29/2016 0531       Component Value Date/Time   WBC 11.9* 01/28/2016 1636   WBC 11.1*  01/28/2016 0200   WBC 15.9* 01/27/2016 1839   HGB 11.2* 01/28/2016 1636   HGB 10.8* 01/28/2016 0200   HGB 12.2 01/27/2016 1839   HCT 36.1 01/28/2016 1636   HCT 34.4* 01/28/2016 0200   HCT 39.6 01/27/2016 1839   MCV 91.9 01/28/2016 1636   MCV 91.7 01/28/2016 0200   MCV 91.5 01/27/2016 1839  Lipid Panel     Component Value Date/Time   CHOL 203* 03/19/2015 1031   CHOL 198 03/04/2013 1535   TRIG 146 03/19/2015 1031   HDL 55 03/19/2015 1031   HDL 46 03/04/2013 1535   CHOLHDL 3.7 03/19/2015 1031   CHOLHDL 4.3 03/04/2013 1535   VLDL 47* 03/04/2013 1535   LDLCALC 119* 03/19/2015 1031   LDLCALC 105* 03/04/2013 1535    ABG No results found for: PHART, PCO2ART, PO2ART, HCO3, TCO2, ACIDBASEDEF, O2SAT   Lab Results  Component Value Date   TSH 1.404 12/30/2015   BNP (last 3 results) No results for input(s): BNP in the last 8760 hours.  ProBNP (last 3 results) No results for input(s): PROBNP in the last 8760 hours.  Cardiac Panel (last 3 results) No results for input(s): CKTOTAL, CKMB, TROPONINI, RELINDX in the last 72 hours.  Iron/TIBC/Ferritin/ %Sat    Component Value Date/Time   IRON 22* 02/11/2010 1422   TIBC 273 02/11/2010 1422   FERRITIN 74 02/11/2010 1422   IRONPCTSAT 8* 02/11/2010 1422     EKG Orders placed or performed during the hospital encounter of 01/27/16  . EKG 12-Lead  . EKG 12-Lead  . EKG     Prior Assessment and Plan Problem List as of 02/04/2016      Cardiovascular and Mediastinum   Essential hypertension   NSTEMI (non-ST elevated myocardial infarction) [facility])   Last Assessment & Plan 01/13/2016 Erroneous Encounter Written 01/13/2016 12:47 PM by Imogene Burn, PA-C    No show today      Paroxysmal atrial fibrillation ([place])     Respiratory   Acute respiratory failure with hypoxia ([facility])   HCAP (healthcare-associated pneumonia)     Digestive   PSEUDOMEMBRANOUS COLITIS   SCHATZKI'S RING   GERD   COLITIS   DYSPHAGIA UNSPECIFIED    DIVERTICULITIS, HX OF   C. difficile colitis     Genitourinary   CKD (chronic kidney disease) stage 3, GFR 30-59 ml/min   AKI (acute kidney injury) ([place])   Acute kidney injury superimposed on CKD ([place])     Other   Hyperlipidemia LDL goal <130   DIARRHEA   History of depression   Frailty   Sepsis ([place])   Hypokalemia   Cough   Palliative care encounter   Advanced care planning/counseling discussion       Imaging: Dg Chest Port 1 View  01/27/2016  CLINICAL DATA:  Acute onset of fever and high blood pressure. Mild cough. Initial encounter. EXAM: PORTABLE CHEST 1 VIEW COMPARISON:  Chest radiograph performed 12/31/2015 FINDINGS: The lungs are hypoexpanded. Vascular crowding and vascular congestion are seen. Peribronchial thickening is noted. Mild bilateral opacities likely reflect atelectasis. No definite pleural effusion or pneumothorax is seen. The cardiomediastinal silhouette is normal in size. No acute osseous abnormalities are identified. IMPRESSION: Lungs hypoexpanded. Vascular congestion noted. Peribronchial thickening noted. Mild bilateral opacities likely reflect atelectasis. Electronically Signed   By: Garald Balding M.D.   On: 01/27/2016 18:33

## 2016-02-12 ENCOUNTER — Other Ambulatory Visit: Payer: Self-pay

## 2016-02-12 ENCOUNTER — Encounter: Payer: Self-pay | Admitting: Gastroenterology

## 2016-02-12 ENCOUNTER — Ambulatory Visit (INDEPENDENT_AMBULATORY_CARE_PROVIDER_SITE_OTHER): Payer: Medicare Other | Admitting: Gastroenterology

## 2016-02-12 VITALS — BP 122/68 | HR 109 | Temp 98.8°F | Ht 66.0 in

## 2016-02-12 DIAGNOSIS — R112 Nausea with vomiting, unspecified: Secondary | ICD-10-CM | POA: Diagnosis not present

## 2016-02-12 MED ORDER — ONDANSETRON HCL 4 MG PO TABS: 4.0000 mg | ORAL_TABLET | Freq: Three times a day (TID) | ORAL | Status: DC

## 2016-02-12 NOTE — Patient Instructions (Signed)
Start taking Zofran 30 minutes before meals and at bedtime scheduled.  I will speak to speech pathology regarding their evaluation.

## 2016-02-17 ENCOUNTER — Encounter: Payer: Self-pay | Admitting: Gastroenterology

## 2016-02-17 ENCOUNTER — Telehealth: Payer: Self-pay | Admitting: Gastroenterology

## 2016-02-17 NOTE — Progress Notes (Signed)
Primary Care Physician:  Vernell Morgans, MD Primary Gastroenterologist:  Dr. Gala Romney   Chief Complaint  Patient presents with  . Emesis  . choking    Aide states that she choked before they came to office    HPI:   Elizabeth Oneal is a 82 y.o. female presenting today at the request of [facility] secondary to vomiting. It appears the referral was placed January 25, 2016, but she was admitted to [facility] 01/27/16- 01/30/16 with pneumonia, Cdiff colitis (which was diagnosed in Feb 2017). She had been placed on a Vanc taper in Feb 2017. She has a remote history of microscopic colitis. History of Schatzki's ring s/p dilation in 2009. Presenting today with her aide. Patient with dementia and crying out for help. Aide states she vomits with eating about 3-4 times per day. Took a bite and wouldn't eat anymore. Able to take her meds with applesauce but gagging a few times. Unclear if having dysphagia. Down about 10 lbs in the past month per aide. Unclear if she is on Prilosec or not. Notes burping and belching. Unclear if any persistent diarrhea.   Past Medical History  Diagnosis Date  . Hyperlipidemia   . Essential hypertension   . Degenerative disc disease, lumbar   . C. difficile colitis   . Reflux   . Hiatal hernia   . History of pneumonia     Past Surgical History  Procedure Laterality Date  . Abdominal hysterectomy    . Cholecystectomy    . Appendectomy    . Abdominal surgery    . Fracture surgery    . Cataract surgery    . Joint replacement    . Colon surgery  2009  . Colonoscopy  2009    Normal rectum, s/p colonic biopsy: microscopic colitis   . Esophagogastroduodenoscopy  2009    Schatzki's ring s/p dilation, small hiatal hernia    Current Outpatient Prescriptions  Medication Sig Dispense Refill  . acetaminophen (TYLENOL) 325 MG tablet Take 2 tablets (650 mg total) by mouth every 4 (four) hours as needed for headache or mild pain.    Marland Kitchen aspirin EC 81 MG EC tablet Take 1  tablet (81 mg total) by mouth daily.    . budesonide (ENTOCORT EC) 3 MG 24 hr capsule Take by mouth daily.    . cefUROXime (CEFTIN) 500 MG tablet Take 1 tablet (500 mg total) by mouth 2 (two) times daily with a meal. Last dose 3/20 AM    . clopidogrel (PLAVIX) 75 MG tablet Take 1 tablet (75 mg total) by mouth daily.    . furosemide (LASIX) 20 MG tablet Take 1 tablet (20 mg total) by mouth daily.    Marland Kitchen lisinopril (PRINIVIL,ZESTRIL) 2.5 MG tablet Take 1 tablet (2.5 mg total) by mouth daily.    . Melatonin 3 MG TABS Take 1 tablet by mouth at bedtime. Reported on 02/04/2016    . metoprolol succinate (TOPROL-XL) 25 MG 24 hr tablet Take 1 tablet (25 mg total) by mouth daily.    . metoprolol tartrate (LOPRESSOR) 25 MG tablet Take 0.5 tablets (12.5 mg total) by mouth 2 (two) times daily. 180 tablet 3  . potassium chloride (KLOR-CON) 20 MEQ packet Take 20 mEq by mouth daily.    . sertraline (ZOLOFT) 25 MG tablet Take 25 mg by mouth daily. Reported on 02/04/2016    . Multiple Vitamin (MULTIVITAMIN) tablet Take 1 tablet by mouth daily. Reported on 02/12/2016    . nitroGLYCERIN (NITROSTAT)  0.4 MG SL tablet Place 1 tablet (0.4 mg total) under the tongue every 5 (five) minutes x 3 doses as needed for chest pain. (Patient not taking: Reported on 02/04/2016)    . ondansetron (ZOFRAN) 4 MG tablet Take 1 tablet (4 mg total) by mouth 4 (four) times daily -  before meals and at bedtime. 120 tablet 1  . oseltamivir (TAMIFLU) 30 MG capsule Take 1 capsule (30 mg total) by mouth daily. Last dose 3/20 (Patient not taking: Reported on 02/04/2016)    . vancomycin (VANCOCIN) 50 mg/mL oral solution 125 mg PO QID x 8 days, then 125 mg PO BID x7 days, then 125 mg PO daily x7 days, then 125 mg PO every other day x 7days, then 125 mg PO every 3 days, then stop. (Patient not taking: Reported on 02/04/2016)     No current facility-administered medications for this visit.    Allergies as of 02/12/2016 - Review Complete 02/12/2016    Allergen Reaction Noted  . Celebrex [celecoxib]  02/28/2013  . Codeine Other (See Comments)   . Lipitor [atorvastatin]  02/28/2013  . Morphine and related  07/30/2012  . Prozac [fluoxetine hcl]  02/28/2013  . Vioxx [rofecoxib]  02/28/2013  . Xanax [alprazolam] Other (See Comments) 03/26/2012    Family History  Problem Relation Age of Onset  . Heart disease Sister     Social History   Social History  . Marital Status: Widowed    Spouse Name: N/A  . Number of Children: N/A  . Years of Education: N/A   Occupational History  . Not on file.   Social History Main Topics  . Smoking status: Never Smoker   . Smokeless tobacco: Not on file  . Alcohol Use: No  . Drug Use: No  . Sexual Activity: Not on file   Other Topics Concern  . Not on file   Social History Narrative    Review of Systems: Unable to obtain due to cognitive status.   Physical Exam: BP 122/68 mmHg  Pulse 109  Temp(Src) 98.8 F (37.1 C)  Ht 5\' 6"  (1.676 m)  Wt  General:   Alert, appears uncomfortable and scared. Crying out for help. Aide at side.  Lungs:  Clear to auscultation bilaterally.  Heart:  S1, S2 present without murmurs appreciated.  Abdomen:  +BS, soft, non-tender and non-distended. Limited exam with patient sitting in wheelchair Rectal:  Deferred  Msk:  Kyphosis  Extremities:  With trace edema  Neurologic:  Alert to person only  Skin:  Intact without significant lesions or rashes.  Lab Results  Component Value Date   WBC 11.9* 01/28/2016   HGB 11.2* 01/28/2016   HCT 36.1 01/28/2016   MCV 91.9 01/28/2016   PLT 232 01/28/2016   Lab Results  Component Value Date   ALT 14 01/27/2016   AST 29 01/27/2016   ALKPHOS 59 01/27/2016   BILITOT 0.9 01/27/2016   Lab Results  Component Value Date   CREATININE 1.15* 01/29/2016   BUN 28* 01/29/2016   NA 145 01/29/2016   K 3.6 01/29/2016   CL 108 01/29/2016   CO2 30 01/29/2016

## 2016-02-17 NOTE — Assessment & Plan Note (Signed)
82 year old lady with dementia presenting with reports of vomiting multiple times a day with eating. Last EGD in 2009 with Schatzki's ring s/p dilation. Unclear etiology, as patient unable to provide information. It is unclear if she has been evaluated by speech therapy, but if not, this would be interesting to see if she has any dysphagia components. Could very well have recurrent ring or stricture vs delayed gastric emptying vs med effect. Weight loss is concerning, as reported by aide present with her. Would avoid PPI in the setting of recent Cdiff unless absolutely needed.   Will start Zofran scheduled with meals and at bedtime.  Addendum on 02/17/16: Spoke with nurse taking care of patient. Vomiting has resolved. Will have her return in 4 weeks. I will also order a speech evaluation in the interim. Would want to avoid an EGD unless absolutely necessary.

## 2016-02-17 NOTE — Telephone Encounter (Signed)
Spoke with Tye Maryland at [facility], she will look it up and will call me back.

## 2016-02-17 NOTE — Telephone Encounter (Signed)
Can we see if she has had a speech eval at [facility] in [place]? If not, I would like to order one.  Please have her return in 4-6 weeks to see me.

## 2016-02-17 NOTE — Telephone Encounter (Signed)
Elizabeth Oneal, please arrange for an appt to see me in 4-6 weeks. Thanks!

## 2016-02-17 NOTE — Progress Notes (Signed)
Received speech therapy notes dated 01/29/16. Appears she has moderate to severe swallowing issues, tolerating oral diet but with anterior-posterior transit time and pharyngeal stage greater than 1 second, pharyngeal swallow may be delayed. May require cueing and compensatory strategies, needs close supervision of oral intake. Recommended a mechanical soft, ground diet.   I will see her in 4 weeks. Would recommend BPE if any concern for dysphagia.

## 2016-02-18 NOTE — Progress Notes (Signed)
CC'ED TO PCP 

## 2016-02-22 ENCOUNTER — Encounter: Payer: Self-pay | Admitting: Internal Medicine

## 2016-02-22 NOTE — Telephone Encounter (Signed)
APPT MADE AND LETTER SENT  °

## 2016-02-23 NOTE — Telephone Encounter (Signed)
Faxed request letter to [facility].

## 2016-03-07 ENCOUNTER — Encounter: Payer: Self-pay | Admitting: Adult Health

## 2016-03-07 ENCOUNTER — Ambulatory Visit (INDEPENDENT_AMBULATORY_CARE_PROVIDER_SITE_OTHER): Payer: Medicare Other | Admitting: Adult Health

## 2016-03-07 VITALS — BP 116/66 | HR 101 | Ht 63.0 in | Wt 148.0 lb

## 2016-03-07 DIAGNOSIS — R000 Tachycardia, unspecified: Secondary | ICD-10-CM

## 2016-03-07 DIAGNOSIS — I5032 Chronic diastolic (congestive) heart failure: Secondary | ICD-10-CM | POA: Diagnosis not present

## 2016-03-07 NOTE — Patient Instructions (Signed)

## 2016-03-07 NOTE — Progress Notes (Signed)
Cardiology Office Note   Date:  03/07/2016   ID:  Tanyanika, Seabourn 06-12-1916, MRN [id number]  PCP:  Hilbert Corrigan, MD  Cardiologist: Cloria Spring, NP   No chief complaint on file.     History of Present Illness: Elizabeth Oneal is a 82 y.o. female who presents for of chronic systolic heart failure, with known history of essential hypertension, with hospitalization in March with pneumonia, patient had echocardiogram in February of 2017 which revealed ischemia versus [place] cardiomyopathy with EF of 30-35%. She is also being followed by GI, and noted to have moderate to severe swallowing issues. She had been referred to palliative care.  Today in clinic she is coughing and sneezing and has vomited upon herself. She is confused and is uncertain what medications she is taking. The skilled nursing facility did not send over a med list. She denies any bleeding dizziness or chest pain.  Past Medical History  Diagnosis Date  . Hyperlipidemia   . Essential hypertension   . Degenerative disc disease, lumbar   . C. difficile colitis   . Reflux   . Hiatal hernia   . History of pneumonia     Past Surgical History  Procedure Laterality Date  . Abdominal hysterectomy    . Cholecystectomy    . Appendectomy    . Abdominal surgery    . Fracture surgery    . Cataract surgery    . Joint replacement    . Colon surgery  2009  . Colonoscopy  2009    Normal rectum, s/p colonic biopsy: microscopic colitis   . Esophagogastroduodenoscopy  2009    Schatzki's ring s/p dilation, small hiatal hernia     Current Outpatient Prescriptions  Medication Sig Dispense Refill  . acetaminophen (TYLENOL) 325 MG tablet Take 2 tablets (650 mg total) by mouth every 4 (four) hours as needed for headache or mild pain.    Marland Kitchen aspirin EC 81 MG EC tablet Take 1 tablet (81 mg total) by mouth daily.    . budesonide (ENTOCORT EC) 3 MG 24 hr capsule Take by mouth daily.    . cefUROXime (CEFTIN) 500  MG tablet Take 1 tablet (500 mg total) by mouth 2 (two) times daily with a meal. Last dose 3/20 AM    . clopidogrel (PLAVIX) 75 MG tablet Take 1 tablet (75 mg total) by mouth daily.    . furosemide (LASIX) 20 MG tablet Take 1 tablet (20 mg total) by mouth daily.    Marland Kitchen lisinopril (PRINIVIL,ZESTRIL) 2.5 MG tablet Take 1 tablet (2.5 mg total) by mouth daily.    . Melatonin 3 MG TABS Take 1 tablet by mouth at bedtime. Reported on 02/04/2016    . metoprolol succinate (TOPROL-XL) 25 MG 24 hr tablet Take 1 tablet (25 mg total) by mouth daily.    . metoprolol tartrate (LOPRESSOR) 25 MG tablet Take 0.5 tablets (12.5 mg total) by mouth 2 (two) times daily. 180 tablet 3  . ondansetron (ZOFRAN) 4 MG tablet Take 1 tablet (4 mg total) by mouth 4 (four) times daily -  before meals and at bedtime. 120 tablet 1  . oseltamivir (TAMIFLU) 30 MG capsule Take 1 capsule (30 mg total) by mouth daily. Last dose 3/20    . potassium chloride (KLOR-CON) 20 MEQ packet Take 20 mEq by mouth daily.    . sertraline (ZOLOFT) 25 MG tablet Take 25 mg by mouth daily. Reported on 02/04/2016     No current facility-administered medications for  this visit.    Allergies:   Celebrex; Codeine; Lipitor; Morphine and related; Prozac; Vioxx; and Xanax    Social History:  The patient  reports that she has never smoked. She does not have any smokeless tobacco history on file. She reports that she does not drink alcohol or use illicit drugs.   Family History:  The patient's family history includes Heart disease in her sister.    ROS: All other systems are reviewed and negative. Unless otherwise mentioned in H&P    PHYSICAL EXAM: VS:  Pulse 101  Ht 5\' 3"  (1.6 m)  Wt 148 lb (67.132 kg)  BMI 26.22 kg/m2  SpO2 91% , BMI Body mass index is 26.22 kg/(m^2). GEN: Well nourished, well developed, in no acute distress HEENT: normal Neck: no JVD, carotid bruits, or masses Cardiac: RRR; tachycardic,no murmurs, rubs, or gallops,no edema   Respiratory:  Diminished in the bases, no wheezes, frequent coughing and sneezing. GI: soft, nontender, nondistended, + BS MS: no deformity or atrophy Skin: warm and dry, no rash Neuro:  Strength and sensation are intact Psych: euthymic mood, full affect  Recent Labs: 12/30/2015: Magnesium 1.8; TSH 1.404 01/27/2016: ALT 14 01/28/2016: Hemoglobin 11.2*; Platelets 232 01/29/2016: BUN 28*; Creatinine, Ser 1.15*; Potassium 3.6; Sodium 145    Lipid Panel    Component Value Date/Time   CHOL 203* 03/19/2015 1031   CHOL 198 03/04/2013 1535   TRIG 146 03/19/2015 1031   HDL 55 03/19/2015 1031   HDL 46 03/04/2013 1535   CHOLHDL 3.7 03/19/2015 1031   CHOLHDL 4.3 03/04/2013 1535   VLDL 47* 03/04/2013 1535   LDLCALC 119* 03/19/2015 1031   LDLCALC 105* 03/04/2013 1535      Wt Readings from Last 3 Encounters:  03/07/16 148 lb (67.132 kg)  02/04/16 158 lb (71.668 kg)  01/27/16 136 lb 12.8 oz (62.052 kg)     ASSESSMENT AND PLAN:  1. Tachycardia: heart rate 100 210 apically.she is listed to be taking both metoprolol and Lopressor. The skilled nursing facility did not send over her medication list. This will need to be reviewed with medication adjustments to have better heart rate control. Once medication list is received will send over orders.  2. Hx of diastolic CHF:no evidence of fluid overload. Breath sounds are diminished but she is not taking deep breaths on command. As I do not have a updated list of medications nothing will be adjusted at this time. She should follow up with primary care as well.   Current medicines are reviewed at length with the patient today.    Labs/ tests ordered today include:  No orders of the defined types were placed in this encounter.     Disposition:   FU with 6 months  Signed, Jory Sims, NP  03/07/2016 1:12 PM    [facility] [address], [place], [place] [address] Phone: [phone]; Fax: [phone]

## 2016-03-07 NOTE — Progress Notes (Signed)
Name: RAKESHA MILETO    DOB: 03-09-1916  Age: 82 y.o.  MR#: [id number]       PCP:  Hilbert Corrigan, MD      Insurance: Payor: MEDICARE / Plan: MEDICARE PART A AND B / Product Type: *No Product type* /   CC:   No chief complaint on file.   VS Filed Vitals:   03/07/16 1308  BP: 116/66  Pulse: 101  Height: 5\' 3"  (1.6 m)  Weight: 148 lb (67.132 kg)  SpO2: 91%    Weights Current Weight  03/07/16 148 lb (67.132 kg)  02/04/16 158 lb (71.668 kg)  01/27/16 136 lb 12.8 oz (62.052 kg)    Blood Pressure  BP Readings from Last 3 Encounters:  03/07/16 116/66  02/12/16 122/68  02/04/16 128/72     Admit date:  (Not on file) Last encounter with RMR:  02/04/2016   Allergy Celebrex; Codeine; Lipitor; Morphine and related; Prozac; Vioxx; and Xanax  Current Outpatient Prescriptions  Medication Sig Dispense Refill  . acetaminophen (TYLENOL) 325 MG tablet Take 2 tablets (650 mg total) by mouth every 4 (four) hours as needed for headache or mild pain.    Marland Kitchen aspirin EC 81 MG EC tablet Take 1 tablet (81 mg total) by mouth daily.    . budesonide (ENTOCORT EC) 3 MG 24 hr capsule Take by mouth daily.    . cefUROXime (CEFTIN) 500 MG tablet Take 1 tablet (500 mg total) by mouth 2 (two) times daily with a meal. Last dose 3/20 AM    . clopidogrel (PLAVIX) 75 MG tablet Take 1 tablet (75 mg total) by mouth daily.    . furosemide (LASIX) 20 MG tablet Take 1 tablet (20 mg total) by mouth daily.    Marland Kitchen lisinopril (PRINIVIL,ZESTRIL) 2.5 MG tablet Take 1 tablet (2.5 mg total) by mouth daily.    . Melatonin 3 MG TABS Take 1 tablet by mouth at bedtime. Reported on 02/04/2016    . metoprolol succinate (TOPROL-XL) 25 MG 24 hr tablet Take 1 tablet (25 mg total) by mouth daily.    . metoprolol tartrate (LOPRESSOR) 25 MG tablet Take 0.5 tablets (12.5 mg total) by mouth 2 (two) times daily. 180 tablet 3  . ondansetron (ZOFRAN) 4 MG tablet Take 1 tablet (4 mg total) by mouth 4 (four) times daily -  before meals and at  bedtime. 120 tablet 1  . oseltamivir (TAMIFLU) 30 MG capsule Take 1 capsule (30 mg total) by mouth daily. Last dose 3/20    . potassium chloride (KLOR-CON) 20 MEQ packet Take 20 mEq by mouth daily.    . sertraline (ZOLOFT) 25 MG tablet Take 25 mg by mouth daily. Reported on 02/04/2016     No current facility-administered medications for this visit.    Discontinued Meds:   There are no discontinued medications.  Patient Active Problem List   Diagnosis Date Noted  . Nausea with vomiting 02/12/2016  . Palliative care encounter   . Advanced care planning/counseling discussion   . Acute kidney injury superimposed on CKD ([place])   . HCAP (healthcare-associated pneumonia)   . AKI (acute kidney injury) ([place]) 01/27/2016  . Cough   . Paroxysmal atrial fibrillation ([place]) 12/29/2015  . C. difficile colitis 12/26/2015  . Sepsis ([place]) 12/26/2015  . NSTEMI (non-ST elevated myocardial infarction) ([place]) 12/26/2015  . Hypokalemia 12/26/2015  . CKD (chronic kidney disease) stage 3, GFR 30-59 ml/min 12/26/2015  . Acute respiratory failure with hypoxia ([place]) 12/26/2015  . History of  depression 03/22/2015  . Frailty 03/22/2015  . PSEUDOMEMBRANOUS COLITIS 02/03/2009  . Hyperlipidemia LDL goal <130 02/03/2009  . Essential hypertension 02/03/2009  . SCHATZKI'S RING 02/03/2009  . GERD 02/03/2009  . COLITIS 02/03/2009  . DYSPHAGIA UNSPECIFIED 02/03/2009  . DIARRHEA 02/03/2009  . DIVERTICULITIS, HX OF 02/03/2009    LABS    Component Value Date/Time   NA 145 01/29/2016 0531   NA 144 01/28/2016 0200   NA 141 01/27/2016 1839   NA 135 03/19/2015 1031   K 3.6 01/29/2016 0531   K 3.5 01/28/2016 0200   K 3.8 01/27/2016 1839   CL 108 01/29/2016 0531   CL 103 01/28/2016 0200   CL 96* 01/27/2016 1839   CO2 30 01/29/2016 0531   CO2 33* 01/28/2016 0200   CO2 36* 01/27/2016 1839   GLUCOSE 101* 01/29/2016 0531   GLUCOSE 107* 01/28/2016 0200   GLUCOSE 129* 01/27/2016 1839   GLUCOSE 114* 03/19/2015 1031    BUN 28* 01/29/2016 0531   BUN 34* 01/28/2016 0200   BUN 35* 01/27/2016 1839   BUN 29 03/19/2015 1031   CREATININE 1.15* 01/29/2016 0531   CREATININE 2.11* 01/28/2016 0200   CREATININE 2.55* 01/27/2016 1839   CREATININE 1.49* 03/04/2013 1535   CALCIUM 8.1* 01/29/2016 0531   CALCIUM 7.7* 01/28/2016 0200   CALCIUM 8.2* 01/27/2016 1839   GFRNONAA 38* 01/29/2016 0531   GFRNONAA 18* 01/28/2016 0200   GFRNONAA 15* 01/27/2016 1839   GFRAA 44* 01/29/2016 0531   GFRAA 21* 01/28/2016 0200   GFRAA 17* 01/27/2016 1839   CMP     Component Value Date/Time   NA 145 01/29/2016 0531   NA 135 03/19/2015 1031   K 3.6 01/29/2016 0531   CL 108 01/29/2016 0531   CO2 30 01/29/2016 0531   GLUCOSE 101* 01/29/2016 0531   GLUCOSE 114* 03/19/2015 1031   BUN 28* 01/29/2016 0531   BUN 29 03/19/2015 1031   CREATININE 1.15* 01/29/2016 0531   CREATININE 1.49* 03/04/2013 1535   CALCIUM 8.1* 01/29/2016 0531   PROT 6.5 01/27/2016 1839   PROT 6.8 03/19/2015 1031   ALBUMIN 2.9* 01/27/2016 1839   ALBUMIN 4.4 03/19/2015 1031   AST 29 01/27/2016 1839   ALT 14 01/27/2016 1839   ALKPHOS 59 01/27/2016 1839   BILITOT 0.9 01/27/2016 1839   BILITOT 0.5 03/19/2015 1031   GFRNONAA 38* 01/29/2016 0531   GFRAA 44* 01/29/2016 0531       Component Value Date/Time   WBC 11.9* 01/28/2016 1636   WBC 11.1* 01/28/2016 0200   WBC 15.9* 01/27/2016 1839   HGB 11.2* 01/28/2016 1636   HGB 10.8* 01/28/2016 0200   HGB 12.2 01/27/2016 1839   HCT 36.1 01/28/2016 1636   HCT 34.4* 01/28/2016 0200   HCT 39.6 01/27/2016 1839   MCV 91.9 01/28/2016 1636   MCV 91.7 01/28/2016 0200   MCV 91.5 01/27/2016 1839    Lipid Panel     Component Value Date/Time   CHOL 203* 03/19/2015 1031   CHOL 198 03/04/2013 1535   TRIG 146 03/19/2015 1031   HDL 55 03/19/2015 1031   HDL 46 03/04/2013 1535   CHOLHDL 3.7 03/19/2015 1031   CHOLHDL 4.3 03/04/2013 1535   VLDL 47* 03/04/2013 1535   LDLCALC 119* 03/19/2015 1031   LDLCALC 105*  03/04/2013 1535    ABG No results found for: PHART, PCO2ART, PO2ART, HCO3, TCO2, ACIDBASEDEF, O2SAT   Lab Results  Component Value Date   TSH 1.404 12/30/2015   BNP (last  3 results) No results for input(s): BNP in the last 8760 hours.  ProBNP (last 3 results) No results for input(s): PROBNP in the last 8760 hours.  Cardiac Panel (last 3 results) No results for input(s): CKTOTAL, CKMB, TROPONINI, RELINDX in the last 72 hours.  Iron/TIBC/Ferritin/ %Sat    Component Value Date/Time   IRON 22* 02/11/2010 1422   TIBC 273 02/11/2010 1422   FERRITIN 74 02/11/2010 1422   IRONPCTSAT 8* 02/11/2010 1422     EKG Orders placed or performed during the hospital encounter of 01/27/16  . EKG 12-Lead  . EKG 12-Lead  . EKG     Prior Assessment and Plan Problem List as of 03/07/2016      Cardiovascular and Mediastinum   Essential hypertension   NSTEMI (non-ST elevated myocardial infarction) [facility])   Last Assessment & Plan 01/13/2016 Erroneous Encounter Written 01/13/2016 12:47 PM by Imogene Burn, PA-C    No show today      Paroxysmal atrial fibrillation ([place])     Respiratory   Acute respiratory failure with hypoxia ([place])   HCAP (healthcare-associated pneumonia)     Digestive   PSEUDOMEMBRANOUS COLITIS   SCHATZKI'S RING   GERD   COLITIS   DYSPHAGIA UNSPECIFIED   DIVERTICULITIS, HX OF   C. difficile colitis   Nausea with vomiting   Last Assessment & Plan 02/12/2016 Office Visit Written 02/17/2016  1:51 PM by Orvil Feil, NP    82 year old lady with dementia presenting with reports of vomiting multiple times a day with eating. Last EGD in 2009 with Schatzki's ring s/p dilation. Unclear etiology, as patient unable to provide information. It is unclear if she has been evaluated by speech therapy, but if not, this would be interesting to see if she has any dysphagia components. Could very well have recurrent ring or stricture vs delayed gastric emptying vs med effect. Weight loss is  concerning, as reported by aide present with her. Would avoid PPI in the setting of recent Cdiff unless absolutely needed.   Will start Zofran scheduled with meals and at bedtime.  Addendum on 02/17/16: Spoke with nurse taking care of patient. Vomiting has resolved. Will have her return in 4 weeks. I will also order a speech evaluation in the interim. Would want to avoid an EGD unless absolutely necessary.         Genitourinary   CKD (chronic kidney disease) stage 3, GFR 30-59 ml/min   AKI (acute kidney injury) ([place])   Acute kidney injury superimposed on CKD ([place])     Other   Hyperlipidemia LDL goal <130   DIARRHEA   History of depression   Frailty   Sepsis ([place])   Hypokalemia   Cough   Palliative care encounter   Advanced care planning/counseling discussion       Imaging: No results found.

## 2016-03-30 ENCOUNTER — Ambulatory Visit (INDEPENDENT_AMBULATORY_CARE_PROVIDER_SITE_OTHER): Payer: Medicare Other | Admitting: Gastroenterology

## 2016-03-30 ENCOUNTER — Encounter: Payer: Self-pay | Admitting: Gastroenterology

## 2016-03-30 VITALS — BP 108/62 | HR 111 | Temp 97.5°F | Ht 62.0 in | Wt 128.0 lb

## 2016-03-30 DIAGNOSIS — R1310 Dysphagia, unspecified: Secondary | ICD-10-CM

## 2016-03-30 DIAGNOSIS — IMO0001 Reserved for inherently not codable concepts without codable children: Secondary | ICD-10-CM

## 2016-03-30 DIAGNOSIS — Z8719 Personal history of other diseases of the digestive system: Secondary | ICD-10-CM | POA: Diagnosis not present

## 2016-03-30 MED ORDER — FAMOTIDINE 20 MG PO TABS: 20.0000 mg | ORAL_TABLET | Freq: Two times a day (BID) | ORAL | Status: DC

## 2016-03-30 NOTE — Progress Notes (Signed)
Referring Provider: Hilbert Corrigan* Primary Care Physician:  Hilbert Corrigan, MD  Primary GI: Dr. Gala Romney   Chief Complaint  Patient presents with  . Follow-up  . Emesis  . Diarrhea    HPI:   Elizabeth Oneal is a 82 y.o. female presenting today with a history of dementia, hospitalized at [facility] 01/27/16- 01/31/16 with pneumonia, Cdiff colitis (which was diagnosed in Feb 2017). She had been placed on a Vanc taper in March 2017, to be completed around mid to late April.She has a remote history of microscopic colitis. Last EGD in 2009 with Schatzki's ring s/p dilation. Seen in March 2017 with reports of vomiting. Spoke with nurse several days after consultation with [place], who stated vomiting had resolved after starting Zofran scheduled with meals and bedtime. Speech therapy January 29, 2016: moderate to severe swallowing issues, tolerating oral diet but with anterior-posterior transit time and pharyngeal stage greater than 1 second, pharyngeal swallow may be delayed. May require cueing and compensatory strategies, needs close supervision of oral intake. Recommended a mechanical soft, ground diet. Recommended speech evaluation for further evaluation. She was then seen by Speech for ongoing evaluation and assistance with eating. Concern for esophageal dysphagia, as patient was swallowing thing liquids and then resulted in audible burps.   Recent episode of choking, aspirated, had a slight fever per son. Son states she has some phlegm. Will sometimes eat just 2 tablespoons of something, then unable to get down more. Has to have help eating. Son will give small bites and let her decide when ready for the next bite. Not chewing up food well. Choking a lot. Difficult in spitting up phlegm.   She is back on vancomycin every other day for a total of 6 weeks. Discussed with nurse, Levada Dy, who states this was written on 4/1. It is unclear exactly what kind of new order was written, but she  has been on Vancomycin 125 mg every other day since April 29th, with orders noting through 6 weeks.   She is also back on budesonide 3 mg daily, starting 02/06/16. This was recommended to be discontinued in February 2017.   I discussed with the nurse, Levada Dy, who states she is unaware of any persisting diarrhea. I also spoke with patient's grandson, Windi Mcfaul (cell [phone]) in detail regarding patient's clinical condition, concerns regarding medication potential overusage, and plan of care going forward. I have placed a call to Wynell Balloon, NP [phone]) but had to leave a message.   Past Medical History  Diagnosis Date  . Hyperlipidemia   . Essential hypertension   . Degenerative disc disease, lumbar   . C. difficile colitis   . Reflux   . Hiatal hernia   . History of pneumonia     Past Surgical History  Procedure Laterality Date  . Abdominal hysterectomy    . Cholecystectomy    . Appendectomy    . Abdominal surgery    . Fracture surgery    . Cataract surgery    . Joint replacement    . Colon surgery  2009  . Colonoscopy  2009    Normal rectum, s/p colonic biopsy: microscopic colitis   . Esophagogastroduodenoscopy  2009    Schatzki's ring s/p dilation, small hiatal hernia    Current Outpatient Prescriptions  Medication Sig Dispense Refill  . acetaminophen (TYLENOL) 325 MG tablet Take 2 tablets (650 mg total) by mouth every 4 (four) hours as needed for headache or mild pain.    Marland Kitchen  aspirin EC 81 MG EC tablet Take 1 tablet (81 mg total) by mouth daily.    . budesonide (ENTOCORT EC) 3 MG 24 hr capsule Take by mouth daily.    . clopidogrel (PLAVIX) 75 MG tablet Take 1 tablet (75 mg total) by mouth daily.    . furosemide (LASIX) 20 MG tablet Take 1 tablet (20 mg total) by mouth daily.    Marland Kitchen lisinopril (PRINIVIL,ZESTRIL) 2.5 MG tablet Take 1 tablet (2.5 mg total) by mouth daily.    . Melatonin 3 MG TABS Take 1 tablet by mouth at bedtime. Reported on 02/04/2016    . metoprolol  tartrate (LOPRESSOR) 25 MG tablet Take 0.5 tablets (12.5 mg total) by mouth 2 (two) times daily. 180 tablet 3  . ondansetron (ZOFRAN) 4 MG tablet Take 1 tablet (4 mg total) by mouth 4 (four) times daily -  before meals and at bedtime. 120 tablet 1  . potassium chloride (KLOR-CON) 20 MEQ packet Take 20 mEq by mouth daily.    . sertraline (ZOLOFT) 25 MG tablet Take 25 mg by mouth daily. Reported on 02/04/2016     No current facility-administered medications for this visit.    Allergies as of 03/30/2016 - Review Complete 03/30/2016  Allergen Reaction Noted  . Celebrex [celecoxib]  02/28/2013  . Codeine Other (See Comments)   . Lipitor [atorvastatin]  02/28/2013  . Morphine and related  07/30/2012  . Prozac [fluoxetine hcl]  02/28/2013  . Vioxx [rofecoxib]  02/28/2013  . Xanax [alprazolam] Other (See Comments) 03/26/2012    Family History  Problem Relation Age of Onset  . Heart disease Sister     Social History   Social History  . Marital Status: Widowed    Spouse Name: N/A  . Number of Children: N/A  . Years of Education: N/A   Social History Main Topics  . Smoking status: Never Smoker   . Smokeless tobacco: None  . Alcohol Use: No  . Drug Use: No  . Sexual Activity: Not Asked   Other Topics Concern  . None   Social History Narrative    Review of Systems: Unable to obtain due to cognitive status.   Physical Exam: BP 108/62 mmHg  Pulse 111  Temp(Src) 97.5 F (36.4 C) (Oral)  Ht 5\' 2"  (1.575 m)  Wt 128 lb (58.06 kg)  BMI 23.41 kg/m2 General:   Alert to person. More pleasant today and peaceful.  Head:  Normocephalic and atraumatic. Eyes:  Conjuctiva clear without scleral icterus. Abdomen:  +BS, soft, non-tender and non-distended. No rebound or guarding. Limited exam in wheelchair Extremities:  Trace pedal edema  Neurologic:  Alert to person. Dementia  Psych:  Alert and cooperative.    Per [facility]: positive Cdiff sample 3/31, which prompted change of Vanc  taper/administration.   Outside labs 5/3: Cr 1.49, BUN 17.3, Sodium 135, WBC 11.6, Hgb 10.6, Hct 34.6, Platelets 336

## 2016-03-30 NOTE — Patient Instructions (Signed)
I have added Pepcid to take twice a day. I feel the benefits outweigh the risks here.   I have also ordered a special xray of your esophagus. We will try to get this done. If not, we will likely need to pursue an upper endoscopy.  We will talk with the nurse regarding your diarrhea.

## 2016-03-31 ENCOUNTER — Telehealth: Payer: Self-pay

## 2016-03-31 NOTE — Telephone Encounter (Signed)
Faxing the note to [phone].

## 2016-03-31 NOTE — Telephone Encounter (Signed)
Per Laban Emperor, NP, I called and informed the nursing home that pt should STOP the ENTOCORT now and that she will have more information for her tomorrow.  I called and spoke to [place], the nursing Doctor, hospital. She said the pt's nurse was on the med cart and she will give her the message, but she also asked for the order to be faxed.  I told her that I would fax this note to her and she said that is fine.

## 2016-03-31 NOTE — Telephone Encounter (Signed)
Grandson  Benson [place] )wants to speak with Vicente Males regarding patients medications and upcoming appt . Call him at  [phone]

## 2016-04-01 DIAGNOSIS — IMO0001 Reserved for inherently not codable concepts without codable children: Secondary | ICD-10-CM | POA: Insufficient documentation

## 2016-04-01 DIAGNOSIS — R1310 Dysphagia, unspecified: Secondary | ICD-10-CM | POA: Insufficient documentation

## 2016-04-01 NOTE — Assessment & Plan Note (Signed)
Positive for Cdiff in Feb 2017 and placed on taper. Appears she was re-tested again 3/31, per [facility]. Discussed with nurse, who states she believes the diarrhea has resolved and she is at her baseline. At some point, Vanc taper (that was started in Feb/March and should have been completed by the latest mid April), was adjusted per [facility], and now she is on Vanc every other day for 6 weeks. This was started, from what I can see, around 4/29. As any antibiotic can actually increase the risk for cdiff, including the actual agents used to treat cdiff, I recommend stopping vanc all together. She has been on a prolonged course of this and likely received max benefit. AS OF NOTE: NO indication to test stool for response to treatment as shedding has been shown to continue up to around 6 weeks after treatment, thereby resulting in a positive test EVEN if patient is asymptomatic and improved. I would not test for Cdiff any further UNLESS there was an acute, significant change in bowel habits from her current baseline. I have also discontinued entocort. She has a history of microscopic colitis in the very remote past, and it appears this was continued for years. I discussed this with the grandson, as microscopic colitis treatment is short-term and should not be continued for years.   Stop Vanc and Entocort. Monitor patient clinically. She is at an extremely high risk for recurrence, and her multiple co-morbidities, age, and multiple other risk factors place her in this category. She will need close monitoring. Hold on any further stool tests unless she has an obvious change from baseline.    Extended time spent talking with family, [facility] staff, and reviewing records. I have placed a call to Rodena Piety, NP, for further discussion and get her input.

## 2016-04-01 NOTE — Telephone Encounter (Signed)
Spoke with grandson. We spoke in detail regarding current medications, therapies, and plan of care for patient. He states she has been on entocort chronically, since at least diagnosis of microscopic colitis many years ago. She is on Entocort 3 mg once daily currently.  The grandson states she is taking this for history of microscopic colitis. I have had the facility contacted and discontinue this medication. I have also placed a call to Wynell Balloon, NP, [phone] for further discussion regarding patient. Message left.

## 2016-04-01 NOTE — Assessment & Plan Note (Signed)
82 year old female with significant upper GI symptoms that appear to be likely related to uncontrolled GERD, unable to rule out occult web, ring, stricture. Speech Pathology has evaluated patient and assisting with compensatory mechanisms. Currently not on a PPI due to recent history of Cdiff. However, the literature is mixed regarding PPI in the presence of Cdiff or risk for infection, although acid suppression therapy should be avoided if at all possible. At this point, her symptoms are significant, and I feel the benefits outweigh the risks, as she is not on any acid suppression whatsoever, and she is doing poorly from an upper GI perspective. I would still like to avoid PPI therapy if at all possible, so I have ordered Pepcid BID. I discussed this with the son, grandson, and nurse at [facility]. We will need to follow her closely for response. In interim, I have also ordered a BPE, in the hopes she will be able to cooperate for this study. I will not be surprised if she is unable to complete this, but we will try. May ultimately need to proceed with an upper endoscopy/dilation as appropriate in the future, especially if she shows no significant improvement. Continue Zofran scheduled before meals and at bedtime.

## 2016-04-04 NOTE — Progress Notes (Signed)
Late entry: spoke with grandson, Trejure Bergamo, on Friday, May 19th,  around 1600. Per his report, patient spiked a temp and aspirated. Now on Levaquin. I called nursing staff who confirmed. Due to her high risk of recurrence Cdiff, I have placed her on Vanc 125 mg po QID ONLY FOR 5 DAYS, which is the duration of Levaquin. This is an empiric approach in the hopes of avoiding recurrent cdiff.

## 2016-04-04 NOTE — Progress Notes (Signed)
CC'D TO PCP °

## 2016-04-07 ENCOUNTER — Ambulatory Visit (HOSPITAL_COMMUNITY)
Admission: RE | Admit: 2016-04-07 | Discharge: 2016-04-07 | Disposition: A | Discharge status: Home / Self Care | Payer: Medicare Other | Source: Ambulatory Visit | Attending: Gastroenterology | Admitting: Gastroenterology

## 2016-04-07 DIAGNOSIS — K449 Diaphragmatic hernia without obstruction or gangrene: Secondary | ICD-10-CM | POA: Insufficient documentation

## 2016-04-07 DIAGNOSIS — K224 Dyskinesia of esophagus: Secondary | ICD-10-CM | POA: Insufficient documentation

## 2016-04-07 DIAGNOSIS — R1310 Dysphagia, unspecified: Secondary | ICD-10-CM

## 2016-04-13 NOTE — Progress Notes (Signed)
Spoke with Elizabeth Balloon, NP working with patient at [facility]. She continues to have vomiting episodes. Upper GI reports reviewed. No obvious stricture, but it was difficult to see exactly what was happening as patient was unable to follow directions. Diffuse dysmotility noted, tertiary contractions noted. I have notified grandson and awaiting call back. Speech therapy is working closely with patient. I am not convinced an EGD would be helpful.

## 2016-04-14 NOTE — Telephone Encounter (Signed)
T/C from [place], said she did not receive the note I faxed on 03/31/2016 and she asked me to refax.  I am doing so now.  Faxing again to [phone], she said this is the correct number and she will let me know if she does not get it.

## 2016-04-15 ENCOUNTER — Telehealth: Payer: Self-pay | Admitting: Gastroenterology

## 2016-04-15 NOTE — Telephone Encounter (Signed)
Patient needs an upper endoscopy with dilation with Dr. Gala Romney. I just saw her in the office on 5/17. Is there any way we can get this done before her 30 days is up? Her grandson will be with her, and he is out of town the 6th and 7th, but if there is another time, can we do it?

## 2016-04-18 ENCOUNTER — Other Ambulatory Visit: Payer: Self-pay

## 2016-04-18 DIAGNOSIS — R1310 Dysphagia, unspecified: Secondary | ICD-10-CM

## 2016-04-18 NOTE — Telephone Encounter (Signed)
Called grandson Joneen Caraway and spoke with him about the plan for an EGD. He is aware and will be at the hospital with pt. Pt is set for EGD on 06/08 @ 1215pm  Faxed instructions to [facility]

## 2016-04-18 NOTE — Progress Notes (Signed)
Discussed with grandson. Discussed with Dr. Gala Romney. Unable to exclude occult esophageal etiology for upper GI symptoms.   We will proceed with an EGD/dilation with Dr. Gala Romney in the near future. The risks and benefits were discussed in detail with grandson. Will proceed on 04/21/16. On Plavix, which will continue.  Orvil Feil, ANP-BC [facility]

## 2016-04-21 ENCOUNTER — Other Ambulatory Visit: Payer: Self-pay | Admitting: Gastroenterology

## 2016-04-21 ENCOUNTER — Encounter (HOSPITAL_COMMUNITY)
Admission: RE | Disposition: A | Discharge status: Skilled Nursing Facility | Payer: Self-pay | Source: Ambulatory Visit | Attending: Internal Medicine

## 2016-04-21 ENCOUNTER — Ambulatory Visit (HOSPITAL_COMMUNITY)
Admission: RE | Admit: 2016-04-21 | Discharge: 2016-04-21 | Disposition: A | Discharge status: Skilled Nursing Facility | Payer: Medicare Other | Source: Ambulatory Visit | Attending: Internal Medicine | Admitting: Internal Medicine

## 2016-04-21 ENCOUNTER — Encounter (HOSPITAL_COMMUNITY): Payer: Self-pay | Admitting: *Deleted

## 2016-04-21 DIAGNOSIS — E785 Hyperlipidemia, unspecified: Secondary | ICD-10-CM | POA: Insufficient documentation

## 2016-04-21 DIAGNOSIS — Z8619 Personal history of other infectious and parasitic diseases: Secondary | ICD-10-CM | POA: Insufficient documentation

## 2016-04-21 DIAGNOSIS — K222 Esophageal obstruction: Secondary | ICD-10-CM | POA: Insufficient documentation

## 2016-04-21 DIAGNOSIS — R1310 Dysphagia, unspecified: Secondary | ICD-10-CM

## 2016-04-21 DIAGNOSIS — K449 Diaphragmatic hernia without obstruction or gangrene: Secondary | ICD-10-CM | POA: Insufficient documentation

## 2016-04-21 DIAGNOSIS — R112 Nausea with vomiting, unspecified: Secondary | ICD-10-CM | POA: Insufficient documentation

## 2016-04-21 DIAGNOSIS — Z7901 Long term (current) use of anticoagulants: Secondary | ICD-10-CM | POA: Diagnosis not present

## 2016-04-21 DIAGNOSIS — R1319 Other dysphagia: Secondary | ICD-10-CM | POA: Insufficient documentation

## 2016-04-21 DIAGNOSIS — Z79899 Other long term (current) drug therapy: Secondary | ICD-10-CM | POA: Insufficient documentation

## 2016-04-21 DIAGNOSIS — K210 Gastro-esophageal reflux disease with esophagitis: Secondary | ICD-10-CM | POA: Insufficient documentation

## 2016-04-21 DIAGNOSIS — Z9660 Presence of unspecified orthopedic joint implant: Secondary | ICD-10-CM | POA: Diagnosis not present

## 2016-04-21 DIAGNOSIS — I10 Essential (primary) hypertension: Secondary | ICD-10-CM | POA: Insufficient documentation

## 2016-04-21 DIAGNOSIS — Z7982 Long term (current) use of aspirin: Secondary | ICD-10-CM | POA: Diagnosis not present

## 2016-04-21 HISTORY — PX: MALONEY DILATION: SHX5535

## 2016-04-21 HISTORY — PX: ESOPHAGOGASTRODUODENOSCOPY: SHX5428

## 2016-04-21 LAB — C DIFFICILE QUICK SCREEN W PCR REFLEX
C Diff antigen: POSITIVE — AB
C Diff toxin: NEGATIVE

## 2016-04-21 SURGERY — EGD (ESOPHAGOGASTRODUODENOSCOPY)
Anesthesia: Moderate Sedation

## 2016-04-21 MED ORDER — METOPROLOL TARTRATE 5 MG/5ML IV SOLN
INTRAVENOUS | Status: AC
  Filled 2016-04-21: qty 5

## 2016-04-21 MED ORDER — MIDAZOLAM HCL 5 MG/5ML IJ SOLN
INTRAMUSCULAR | Status: AC
  Filled 2016-04-21: qty 5

## 2016-04-21 MED ORDER — MEPERIDINE HCL 100 MG/ML IJ SOLN
INTRAMUSCULAR | Status: DC | PRN
  Administered 2016-04-21: 15 mg via INTRAVENOUS

## 2016-04-21 MED ORDER — LIDOCAINE VISCOUS 2 % MT SOLN
OROMUCOSAL | Status: DC | PRN
  Administered 2016-04-21: 3 mL via OROMUCOSAL

## 2016-04-21 MED ORDER — MIDAZOLAM HCL 5 MG/5ML IJ SOLN
INTRAMUSCULAR | Status: DC | PRN
  Administered 2016-04-21: 0.5 mg via INTRAVENOUS

## 2016-04-21 MED ORDER — MEPERIDINE HCL 100 MG/ML IJ SOLN
INTRAMUSCULAR | Status: AC
  Filled 2016-04-21: qty 1

## 2016-04-21 MED ORDER — LIDOCAINE VISCOUS 2 % MT SOLN
OROMUCOSAL | Status: AC
  Filled 2016-04-21: qty 15

## 2016-04-21 MED ORDER — ONDANSETRON HCL 4 MG/2ML IJ SOLN
INTRAMUSCULAR | Status: AC
  Filled 2016-04-21: qty 2

## 2016-04-21 MED ORDER — SODIUM CHLORIDE 0.9 % IV SOLN
INTRAVENOUS | Status: DC
  Administered 2016-04-21: 12:00:00 via INTRAVENOUS

## 2016-04-21 MED ORDER — METOPROLOL TARTRATE 5 MG/5ML IV SOLN
2.5000 mg | Freq: Once | INTRAVENOUS | Status: AC
  Administered 2016-04-21: 2.5 mg via INTRAVENOUS

## 2016-04-21 MED ORDER — SIMETHICONE 40 MG/0.6ML PO SUSP
ORAL | Status: DC | PRN
  Administered 2016-04-21: 13:00:00

## 2016-04-21 MED ORDER — ONDANSETRON HCL 4 MG/2ML IJ SOLN
INTRAMUSCULAR | Status: DC | PRN
Start: 2016-04-21 — End: 2016-04-21
  Administered 2016-04-21: 4 mg via INTRAVENOUS

## 2016-04-21 NOTE — Interval H&P Note (Signed)
History and Physical Interval Note:  04/21/2016 1:00 PM  Elizabeth Oneal  has presented today for surgery, with the diagnosis of dysphagia  The various methods of treatment have been discussed with the patient and family. After consideration of risks, benefits and other options for treatment, the patient has consented to  Procedure(s) with comments: ESOPHAGOGASTRODUODENOSCOPY (EGD) (N/A) - 1215pm MALONEY DILATION (N/A) as a surgical intervention .  The patient's history has been reviewed, patient examined, no change in status, stable for surgery.  I have reviewed the patient's chart and labs.  Questions were answered to the patient's satisfaction.     Elizabeth Oneal  Patient is seen and examined in preop. Discussed the grandson at length. Patient has a persisting diarrhea and intermittent nausea and vomiting. Recent esophagram demonstrated dysmotility but no obstruction. She remains on Plavix. Discussed with patient and grandson, will proceed with a diagnostic EGD with no plans for dilation. Patient needs repeat C. difficile PCR and toxin assay at this time. Further recommendations to follow.The risks, benefits, limitations, alternatives and imponderables have been reviewed with the patient. Potential for esophageal dilation, biopsy, etc. have also been reviewed.  Questions have been answered. All parties agreeable.

## 2016-04-21 NOTE — Discharge Instructions (Signed)
EGD Discharge instructions Please read the instructions outlined below and refer to this sheet in the next few weeks. These discharge instructions provide you with general information on caring for yourself after you leave the hospital. Your doctor may also give you specific instructions. While your treatment has been planned according to the most current medical practices available, unavoidable complications occasionally occur. If you have any problems or questions after discharge, please call your doctor. ACTIVITY  You may resume your regular activity but move at a slower pace for the next 24 hours.   Take frequent rest periods for the next 24 hours.   Walking will help expel (get rid of) the air and reduce the bloated feeling in your abdomen.   No driving for 24 hours (because of the anesthesia (medicine) used during the test).   You may shower.   Do not sign any important legal documents or operate any machinery for 24 hours (because of the anesthesia used during the test).  NUTRITION  Drink plenty of fluids.   You may resume your normal diet.   Begin with a light meal and progress to your normal diet.   Avoid alcoholic beverages for 24 hours or as instructed by your caregiver.  MEDICATIONS  You may resume your normal medications unless your caregiver tells you otherwise.  WHAT YOU CAN EXPECT TODAY  You may experience abdominal discomfort such as a feeling of fullness or gas pains.  FOLLOW-UP  Your doctor will discuss the results of your test with you.  SEEK IMMEDIATE MEDICAL ATTENTION IF ANY OF THE FOLLOWING OCCUR:  Excessive nausea (feeling sick to your stomach) and/or vomiting.   Severe abdominal pain and distention (swelling).   Trouble swallowing.   Temperature over 101 F (37.8 C).   Rectal bleeding or vomiting of blood.    Stop  Pepcid  Begin Protonix 40 mg daily  Dysphagia 2 diet  The patient is having diarrhea, would avoid all laxatives including  senna  Office visit with [place] in 3-4 weeks  Clostridium difficile PCR and toxin assay today           Dysphagia Diet Level 2, Mechanically Altered The dysphagia level 2 diet includes foods that are blended, chopped, ground, or mashed so they are easier to chew and swallow. The foods are soft, moist, and can be chopped into -inch chunks (such as pancakes, pasta, and bananas). In order to be on this diet, you must be able to chew. This diet helps you transition between the pureed textures of the dysphagia level 1 diet to more solid textures. This diet is helpful for people with mild to moderate swallowing difficulties. It reduces the risk of food getting caught in the windpipe, trachea, or lungs.  You may need help or supervision during meals while following this diet so that you eat safely. You will be on this diet until your health care provider advances the texture of your diet.  WHAT DO I NEED TO KNOW ABOUT THIS DIET? Foods  You may eat foods that are soft and moist.  You may need to use a blender, whisk, or masher to soften some of your foods.  You can moisten foods with gravies, sauces, vegetable or fruit juice, milk, half and half, or water when blending, mashing, or grinding your foods to the right consistency.  If you were on the dysphagia level 1 diet, you may still eat any of the foods included in that diet.  Avoid foods that are dry, hard,  sticky, chewy, coarse, and crunchy. Also avoid large cuts of food.  Take small bites. Each bite should contain  inch or less of food. Liquids  Avoid liquids with seeds and chunks.  Thicken liquids, if instructed by your health care provider. Your health care provider will tell you the consistency to which you should thicken your liquids for safe swallowing. To thicken a liquid, use a commercial thickener or a thickening food (such as rice cereal or potato flakes). Ask your health care provider for specific recommendations on  thickeners. See your dietitian or health care provider regularly for help with your dietary changes. WHAT FOODS CAN I EAT? Grains Store-bought soft breads that do not have nuts or seeds. Pancakes, sweet rolls, [place] pastries, and [place] toast that have been moistened with syrup or sauce to form a slurry when blended. Well-cooked pasta, noodles, and bread dressing. Well-cooked noodles and pasta in sauce. Moist macaroni and cheese. Soft dumplings or spaetzle with gravy or butter. Cooked cereals (including oatmeal). Low-texture dry cereals, such as rice puff, corn, or wheat-flake cereals, with milk (if thin liquids are not allowed, make sure all of the milk is absorbed by the cereal before eating it). Vegetables Very soft, well-cooked vegetables in pieces less than  inch in size. Cooked potatoes that are moist, not crispy, and with sauce. Fruits Canned or cooked fruits that are soft or moist and do not have skin or seeds. Fresh, soft bananas. Fruit juices with a small amount of pulp (if thin liquids are allowed). Gelatin or plain gelatin with canned fruit, except pineapple. Meat and Other Protein Sources Tender, moist meats, poultry, or fish cooked with gravy or sauce and cubed to -inch bites or smaller. Ground meat. Moist meatball or meatloaf. Fish without bones. Moist casseroles without rice. Tuna, egg, or meat salad without chunks or hard-to-chew vegetables, such as celery and onions. Smooth quiche without large chunks. Scrambled, poached, or soft-cooked eggs with butter, margarine, sauce, or gravy. Tofu. Well-cooked, moistened and mashed beans, peas, baked beans, and other legumes. Casseroles without rice (such as tuna noodle casserole or soft moist meat lasagna). Dairy Cream cheese. Yogurt. Cottage cheese. Ask your health care provider if milk is allowed. Sweets/Desserts Pudding. Custard. Soft fruit pies with crust on the bottom only. Crisps and cobblers without seeds or nuts and with soft crusts.  Soft, moist cakes. Icing. Pre-gelled cookies. Soft, moist cookies dunked in milk, coffee, or another liquid. Jelly. Soft, smooth chocolate bars that are easily chewed. Jams and preserves without seeds. Ask your health care provider whether you can have frozen desserts. Fats and Oils Butter. Margarine. Cream for cereal, depending on liquid consistency allowed. Gravy. Cream sauces. Mayonnaise. Salad dressings. Cream cheese. Cheese spreads, plain or with soft fruits or vegetables added. Sour cream. Sour cream dips with soft fruits or vegetables added. Whipped toppings. Other Sauces and salsas that have soft chunks that are about  inch or smaller. The items listed above may not be a complete list of recommended foods or beverages. Contact your dietitian for more options. WHAT FOODS ARE NOT RECOMMENDED? Grains All breads not listed in the recommended list. Breads that are hard or have nuts or seeds. Coarse cereals. Cereals that have nuts, seeds, dried fruits, or coconut. Rice. Corn. Vegetables Whole, raw, frozen, or dried vegetables. Tough, fibrous, chewy, or stringy cooked vegetables, such as celery, peas, broccoli, cabbage, Brussels sprouts, and asparagus. Potato skins. Potato and other vegetable chips. Fried or French-fried potatoes. Cooked corn and peas. Fruits Whole raw,  frozen, or dried fruits, including coconut. Pineapple. Fruits with seeds. Meat and Other Protein Sources Dry, tough meats, such as bacon, sausage, and hot dogs. Cheese slices and cubes. Peanut butter. Hard boiled or fried eggs. Nuts. Seeds. Pizza. Sandwiches. Dry casseroles or casseroles with rice or large chunks. Dairy Yogurt with nuts, seeds, or large chunks. Sweets/Desserts Coarse, hard, chewy, or sticky desserts. Any dessert with nuts, seeds, coconut, pineapple, or dried fruit. Ask your health care provider whether you can have frozen desserts. Fats and Oils Avoid fats with chunky, large textures, such as those with nuts or  fruits. Other Soups and casseroles with large chunks. The items listed above may not be a complete list of foods and beverages to avoid. Contact your dietitian for more information.   This information is not intended to replace advice given to you by your health care provider. Make sure you discuss any questions you have with your health care provider.   Document Released: 10/31/2005 Document Revised: 11/21/2014 Document Reviewed: 10/14/2013 Elsevier Interactive Patient Education [employer].

## 2016-04-21 NOTE — H&P (View-Only) (Signed)
Referring Provider: Hilbert Corrigan* Primary Care Physician:  Hilbert Corrigan, MD  Primary GI: Dr. Gala Romney   Chief Complaint  Patient presents with  . Follow-up  . Emesis  . Diarrhea    HPI:   Elizabeth Oneal is a 82 y.o. female presenting today with a history of dementia, hospitalized at [facility] 01/27/16- 01/31/16 with pneumonia, Cdiff colitis (which was diagnosed in Feb 2017). She had been placed on a Vanc taper in March 2017, to be completed around mid to late April.She has a remote history of microscopic colitis. Last EGD in 2009 with Schatzki's ring s/p dilation. Seen in March 2017 with reports of vomiting. Spoke with nurse several days after consultation with [place], who stated vomiting had resolved after starting Zofran scheduled with meals and bedtime. Speech therapy January 29, 2016: moderate to severe swallowing issues, tolerating oral diet but with anterior-posterior transit time and pharyngeal stage greater than 1 second, pharyngeal swallow may be delayed. May require cueing and compensatory strategies, needs close supervision of oral intake. Recommended a mechanical soft, ground diet. Recommended speech evaluation for further evaluation. She was then seen by Speech for ongoing evaluation and assistance with eating. Concern for esophageal dysphagia, as patient was swallowing thing liquids and then resulted in audible burps.   Recent episode of choking, aspirated, had a slight fever per son. Son states she has some phlegm. Will sometimes eat just 2 tablespoons of something, then unable to get down more. Has to have help eating. Son will give small bites and let her decide when ready for the next bite. Not chewing up food well. Choking a lot. Difficult in spitting up phlegm.   She is back on vancomycin every other day for a total of 6 weeks. Discussed with nurse, Levada Dy, who states this was written on 4/1. It is unclear exactly what kind of new order was written, but she  has been on Vancomycin 125 mg every other day since April 29th, with orders noting through 6 weeks.   She is also back on budesonide 3 mg daily, starting 02/06/16. This was recommended to be discontinued in February 2017.   I discussed with the nurse, Levada Dy, who states she is unaware of any persisting diarrhea. I also spoke with patient's grandson, Erma Montjoy (cell [phone]) in detail regarding patient's clinical condition, concerns regarding medication potential overusage, and plan of care going forward. I have placed a call to Wynell Balloon, NP [phone]) but had to leave a message.   Past Medical History  Diagnosis Date  . Hyperlipidemia   . Essential hypertension   . Degenerative disc disease, lumbar   . C. difficile colitis   . Reflux   . Hiatal hernia   . History of pneumonia     Past Surgical History  Procedure Laterality Date  . Abdominal hysterectomy    . Cholecystectomy    . Appendectomy    . Abdominal surgery    . Fracture surgery    . Cataract surgery    . Joint replacement    . Colon surgery  2009  . Colonoscopy  2009    Normal rectum, s/p colonic biopsy: microscopic colitis   . Esophagogastroduodenoscopy  2009    Schatzki's ring s/p dilation, small hiatal hernia    Current Outpatient Prescriptions  Medication Sig Dispense Refill  . acetaminophen (TYLENOL) 325 MG tablet Take 2 tablets (650 mg total) by mouth every 4 (four) hours as needed for headache or mild pain.    Marland Kitchen  aspirin EC 81 MG EC tablet Take 1 tablet (81 mg total) by mouth daily.    . budesonide (ENTOCORT EC) 3 MG 24 hr capsule Take by mouth daily.    . clopidogrel (PLAVIX) 75 MG tablet Take 1 tablet (75 mg total) by mouth daily.    . furosemide (LASIX) 20 MG tablet Take 1 tablet (20 mg total) by mouth daily.    Marland Kitchen lisinopril (PRINIVIL,ZESTRIL) 2.5 MG tablet Take 1 tablet (2.5 mg total) by mouth daily.    . Melatonin 3 MG TABS Take 1 tablet by mouth at bedtime. Reported on 02/04/2016    . metoprolol  tartrate (LOPRESSOR) 25 MG tablet Take 0.5 tablets (12.5 mg total) by mouth 2 (two) times daily. 180 tablet 3  . ondansetron (ZOFRAN) 4 MG tablet Take 1 tablet (4 mg total) by mouth 4 (four) times daily -  before meals and at bedtime. 120 tablet 1  . potassium chloride (KLOR-CON) 20 MEQ packet Take 20 mEq by mouth daily.    . sertraline (ZOLOFT) 25 MG tablet Take 25 mg by mouth daily. Reported on 02/04/2016     No current facility-administered medications for this visit.    Allergies as of 03/30/2016 - Review Complete 03/30/2016  Allergen Reaction Noted  . Celebrex [celecoxib]  02/28/2013  . Codeine Other (See Comments)   . Lipitor [atorvastatin]  02/28/2013  . Morphine and related  07/30/2012  . Prozac [fluoxetine hcl]  02/28/2013  . Vioxx [rofecoxib]  02/28/2013  . Xanax [alprazolam] Other (See Comments) 03/26/2012    Family History  Problem Relation Age of Onset  . Heart disease Sister     Social History   Social History  . Marital Status: Widowed    Spouse Name: N/A  . Number of Children: N/A  . Years of Education: N/A   Social History Main Topics  . Smoking status: Never Smoker   . Smokeless tobacco: None  . Alcohol Use: No  . Drug Use: No  . Sexual Activity: Not Asked   Other Topics Concern  . None   Social History Narrative    Review of Systems: Unable to obtain due to cognitive status.   Physical Exam: BP 108/62 mmHg  Pulse 111  Temp(Src) 97.5 F (36.4 C) (Oral)  Ht 5\' 2"  (1.575 m)  Wt 128 lb (58.06 kg)  BMI 23.41 kg/m2 General:   Alert to person. More pleasant today and peaceful.  Head:  Normocephalic and atraumatic. Eyes:  Conjuctiva clear without scleral icterus. Abdomen:  +BS, soft, non-tender and non-distended. No rebound or guarding. Limited exam in wheelchair Extremities:  Trace pedal edema  Neurologic:  Alert to person. Dementia  Psych:  Alert and cooperative.    Per [facility]: positive Cdiff sample 3/31, which prompted change of Vanc  taper/administration.   Outside labs 5/3: Cr 1.49, BUN 17.3, Sodium 135, WBC 11.6, Hgb 10.6, Hct 34.6, Platelets 336

## 2016-04-21 NOTE — OR Nursing (Signed)
Patient here for a EGD with dilation. Upon arrival, patient had a yellow/green watery stool. Buttocks and vaginal area red and excoriated. Shona Needles at bedside and said patient had C.diff and still has at least 12 stools per day. Heartrate 128-132 and BP 103/49. Dr. Gala Romney notified and order received for Lopressor 2.5mg  IV.

## 2016-04-21 NOTE — Op Note (Signed)
[facility] Patient Name: Elizabeth Oneal Procedure Date: 04/21/2016 1:03 PM MRN: [id number] Date of Birth: 1916-09-13 Attending MD: Norvel Richards , MD CSN: [id number] Age: 82 Admit Type: Outpatient Procedure:                Upper GI endoscopy - diagnostic Indications:              Esophageal dysphagia, Nausea with vomiting Providers:                Norvel Richards, MD, Gwenlyn Fudge, RN, Randa Spike, Technician Referring MD:              Medicines:                Midazolam 0.5 mg IV, Meperidine 15 mg IV,                            Ondansetron 4 mg IV Complications:            No immediate complications. Estimated Blood Loss:     Estimated blood loss was minimal. Procedure:                Pre-Anesthesia Assessment:                           - Prior to the procedure, a History and Physical                            was performed, and patient medications and                            allergies were reviewed. The patient's tolerance of                            previous anesthesia was also reviewed. The risks                            and benefits of the procedure and the sedation                            options and risks were discussed with the patient.                            All questions were answered, and informed consent                            was obtained. Prior Anticoagulants: The patient                            last took Plavix (clopidogrel) 1 day prior to the                            procedure. ASA Grade Assessment: III - A patient  with severe systemic disease. After reviewing the                            risks and benefits, the patient was deemed in                            satisfactory condition to undergo the procedure.                           After obtaining informed consent, the endoscope was                            passed under direct vision. Throughout the         procedure, the patient's blood pressure, pulse, and                            oxygen saturations were monitored continuously. The                            [id number]) scope was introduced through the                            mouth, and advanced to the second part of duodenum.                            The upper GI endoscopy was accomplished without                            difficulty. The patient tolerated the procedure                            well. Scope In: 1:16:52 PM Scope Out: 1:20:00 PM Total Procedure Duration: 0 hours 3 minutes 8 seconds  Findings:      One severe benign-appearing, intrinsic stenosis. peptic strict was found       35 to 36 cm from the incisors. This measured less than one cm (in       length). reflux esophagitis also present. Initially, would not admit the       adult gastroscope, with gentle pressure the scope was allowed through       the narrowing with some dilation occurring. There was minimal blood loss       and no apparent complication related to this maneuver.      A medium-sized hiatal hernia was present.      The second portion of the duodenum was normal. Impression:               Dilated with scope passage. Reflux esophagitis.                           - Benign-appearing esophageal stenosis.                           - Medium-sized hiatal hernia.                           -  Normal second portion of the duodenum.                           - No specimens collected. Moderate Sedation:      Moderate (conscious) sedation was administered by the endoscopy nurse       and supervised by the endoscopist. The following parameters were       monitored: oxygen saturation, heart rate, blood pressure, and response       to care. Total physician intraservice time was 7 minutes. Recommendation:           - Patient has a contact number available for                            emergencies. The signs and symptoms of potential                             delayed complications were discussed with the                            patient. Return to normal activities tomorrow.                            Written discharge instructions were provided to the                            patient.                           - Chopped diet.                           - Follow an antireflux regimen.                           - Use Protonix (pantoprazole) 40 mg PO daily. Stop                            famotidine. Will send STOOL FOR CDIFFICILE ANALYSIS                            AS PATIENT REPORTED TO CONTINUE HAVING DIARRHEA>                            Nursing home to withhold laxative therapy of any                            type if patient having diarrhea                           - Patient has a contact number available for                            emergencies. The signs and symptoms of potential  delayed complications were discussed with the                            patient. Return to normal activities tomorrow.                            Written discharge instructions were provided to the                            patient.                           - No repeat upper endoscopy for now.                           - Return to GI office in 3 weeks. Discussed fi                           - Continue present medications. Procedure Code(s):        --- Professional ---                           [id number], Esophagogastroduodenoscopy, flexible,                            transoral; diagnostic, including collection of                            specimen(s) by brushing or washing, when performed                            (separate procedure) Diagnosis Code(s):        --- Professional ---                           K22.2, Esophageal obstruction                           K44.9, Diaphragmatic hernia without obstruction or                            gangrene CPT copyright 2016 American [employer] Association. All rights reserved. The  codes documented in this report are preliminary and upon coder review may  be revised to meet current compliance requirements. [name]. [name], MD Norvel Richards, MD 04/21/2016 1:48:58 PM This report has been signed electronically. Number of Addenda: 0

## 2016-04-25 ENCOUNTER — Encounter (HOSPITAL_COMMUNITY): Payer: Self-pay | Admitting: Internal Medicine

## 2016-04-26 ENCOUNTER — Telehealth: Payer: Self-pay | Admitting: Gastroenterology

## 2016-04-26 NOTE — Telephone Encounter (Signed)
Can you move the 6/20 appt back a week to the last week in June or first of July with me, may use an urgent. I'm familiar with patient and Magda Paganini has not seen before: may be best to keep it with me if possible since I know her history and have been working with her.  Orvil Feil, ANP-BC [facility]

## 2016-04-26 NOTE — Progress Notes (Signed)
Quick Note:  I spoke with patient's nurse. Senna is discontinued. The entocort was restarted by Rodena Piety the NP working at [employer]. I have asked this to be discontinued. I would like to see where we stand without the senna and entocort. Will arrange follow-up with me. ______

## 2016-04-26 NOTE — Progress Notes (Signed)
Quick Note:  Cdiff antigen positive but TOXIN negative. This could mean she is a carrier. Senna has been discontinued per Dr. Roseanne Kaufman recommendation. However, I spoke with the grandson, Joneen Caraway, who states that she is still on entocort 3 mg daily. We asked this to be discontinued on 03/31/16. I do not understand why she is back on it. This is an interesting scenario, as she has a history of microscopic colitis and it is unclear if she has recurrent microscopic colitis, persistent Cdiff clinically (even though toxins negative), OR from senna.   1. Please ensure Senna is discontinued. 2. Please have them stop entocort (second request). 3. Please have her follow-up to see me only in the next 3 weeks. ______

## 2016-04-27 NOTE — Progress Notes (Signed)
PATIENT SCHEDULED FOR FU OV °

## 2016-04-27 NOTE — Telephone Encounter (Signed)
MOVED APPOINTMENT AND NOTIFIED [facility] OF THE CHANGE

## 2016-05-03 ENCOUNTER — Ambulatory Visit: Payer: Medicare Other | Admitting: Gastroenterology

## 2016-05-06 IMAGING — DX DG CHEST 2V
2 series · 2 of 2 positions shown · non-contrast
Comparison: [facility] chest x-ray dated July 30, 2012

CLINICAL DATA: Nonproductive cough, fever, and shortness of breath
for the past 2-3 days, limited history available from the patient.

EXAM:
CHEST  2 VIEW

[chest lat]
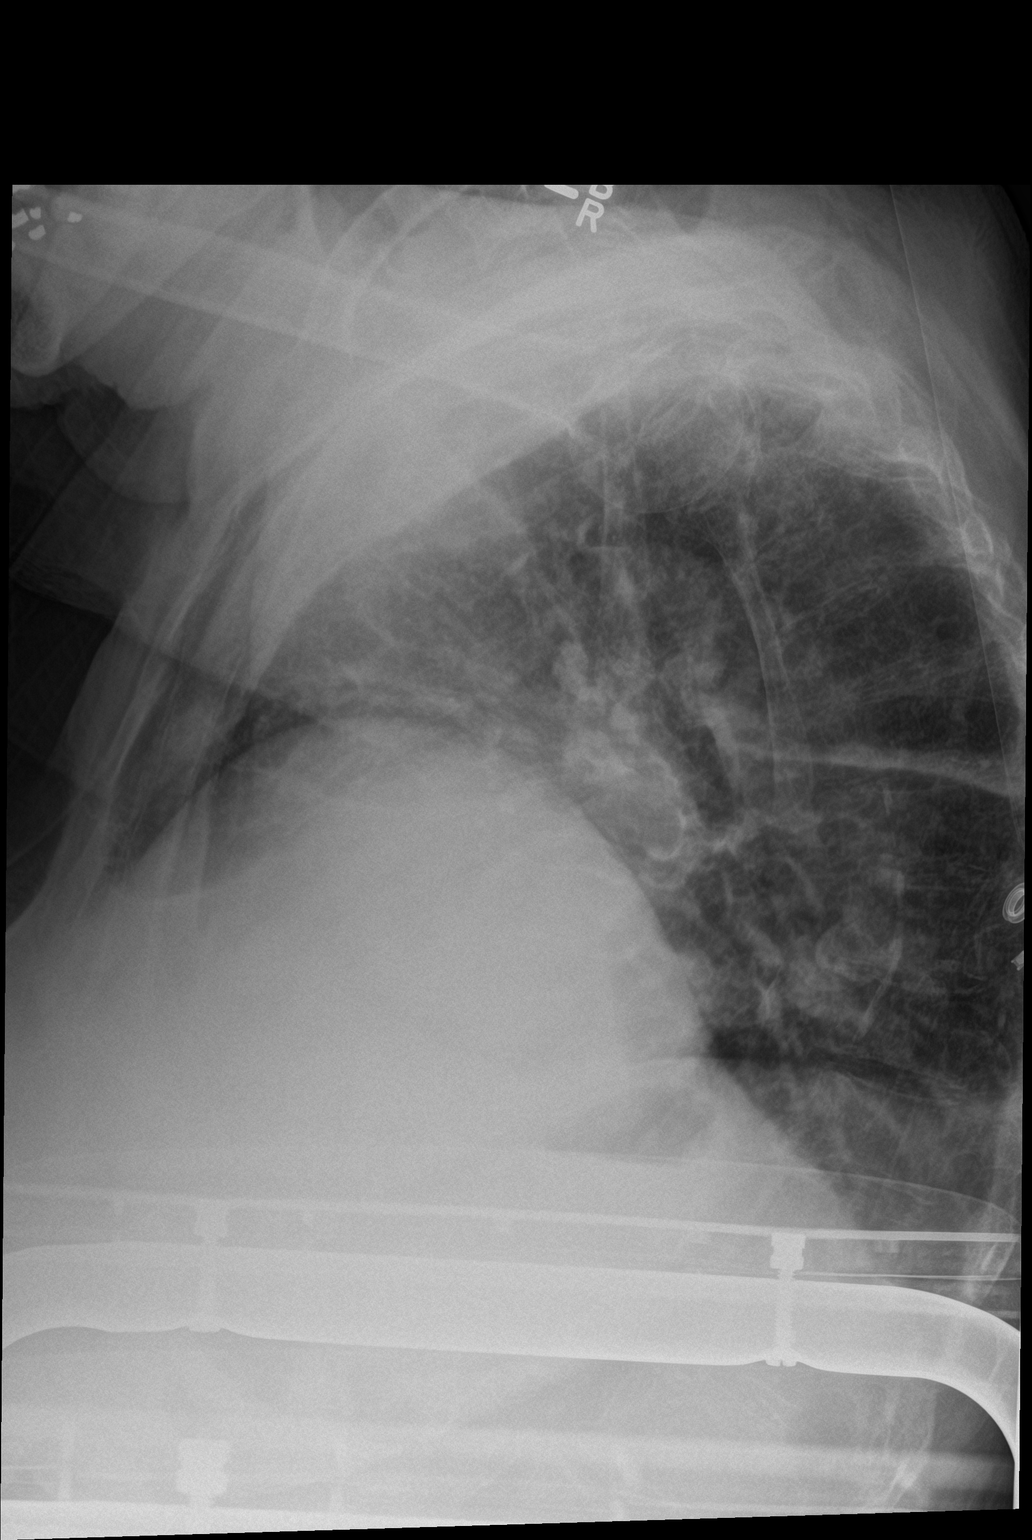

[chest ap]
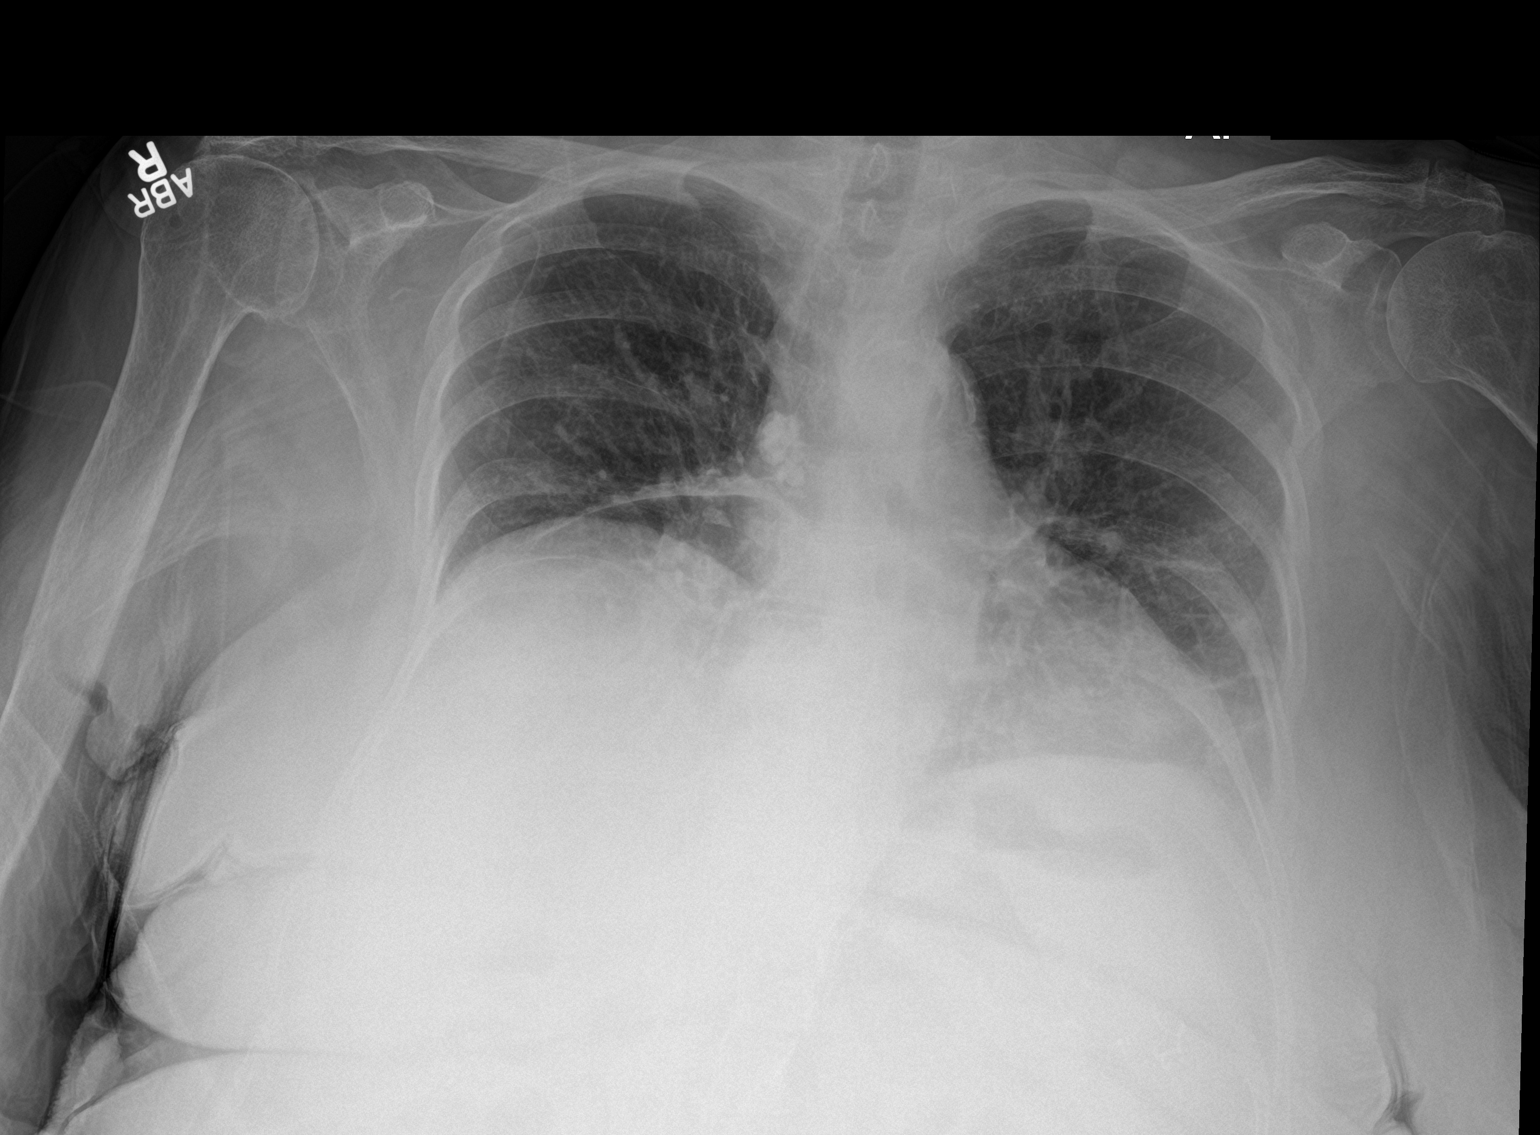

[2 of 2 positions shown; findings below may reference images not displayed]

FINDINGS: The right hemidiaphragm remains elevated. The right lung is
hypoinflated. There is some thickening of the minor fissure. On the
left there is subsegmental atelectasis in the lower lobe. There is
no alveolar infiltrate. There is no pleural effusion or
pneumothorax. The cardiac silhouette is top-normal in size. The
pulmonary vascularity is not engorged. The observed bony thorax
exhibits no acute abnormality. Fine detail of the thoracic vertebral
bodies is limited.
IMPRESSION: The study is limited due to hypo inflation. There is subsegmental
atelectasis on the left and likely on the right. There is some
thickening of the minor fissure possibly related to atelectasis or
fluid. There is no pulmonary edema.

## 2016-05-10 ENCOUNTER — Ambulatory Visit (INDEPENDENT_AMBULATORY_CARE_PROVIDER_SITE_OTHER): Payer: Medicare Other | Admitting: Gastroenterology

## 2016-05-10 VITALS — Wt 128.0 lb

## 2016-05-10 DIAGNOSIS — Z8719 Personal history of other diseases of the digestive system: Secondary | ICD-10-CM

## 2016-05-10 DIAGNOSIS — K222 Esophageal obstruction: Secondary | ICD-10-CM

## 2016-05-10 DIAGNOSIS — IMO0001 Reserved for inherently not codable concepts without codable children: Secondary | ICD-10-CM

## 2016-05-10 MED ORDER — PANTOPRAZOLE SODIUM 40 MG PO TBEC: 40.0000 mg | DELAYED_RELEASE_TABLET | Freq: Every day | ORAL | Status: DC

## 2016-05-10 NOTE — Patient Instructions (Signed)
I did not see this on your medication list, but I would like for you to take Protonix once each morning, 30 minutes before breakfast.   I will see you in 6 months!

## 2016-05-11 ENCOUNTER — Telehealth: Payer: Self-pay | Admitting: Gastroenterology

## 2016-05-11 NOTE — Telephone Encounter (Signed)
Can we have an updated med list from [facility]? I am getting mixed reports from [place], the grandson, and the med list I was given at time of the appt. Thanks!

## 2016-05-11 NOTE — Telephone Encounter (Signed)
I have made a letter with the request and I have faxed it to all 3 fax numbers I have for [facility].

## 2016-05-13 ENCOUNTER — Encounter: Payer: Self-pay | Admitting: Gastroenterology

## 2016-05-13 NOTE — Assessment & Plan Note (Signed)
Unclear reports regarding stool. Appears at baseline she has looser stool, but she does not clinically appear to have Cdiff. Although most recent PCR positive, the toxin was negative. At this point, her case is somewhat complicated due to history of microscopic colitis. In her situation, I would avoid antibiotics unless she had a marked change in her baseline, which at that point would reassess for Cdiff to see if positive for toxin. It does not appear from the med list that she is on entocort, although her grandson states he recently gave this to her. I had discontinued this on several occasions, and it is unclear if she is taking or not. Her best interest would possibly be imodium prn. I would like to avoid entocort due to history of cdiff. With limited history due to dementia and conflicting reports from grandson and facility, I am not entirely clear regarding her day to day bowel habits. Return in 6 months or sooner if needed. In the interim, I have requested the medication administration record prior to any further recommendations.

## 2016-05-13 NOTE — Assessment & Plan Note (Signed)
Doing quite well after dilation. I have sent in Protonix to take once each morning, as I did not see this on the medication list. I have also requested a copy of her med list, as there are conflicting reports from grandson and facility documentation. Return in 6 months.

## 2016-05-13 NOTE — Progress Notes (Signed)
Referring Provider: Hilbert Corrigan* Primary Care Physician:  Hilbert Corrigan, MD Primary GI: Dr. Gala Romney   Chief Complaint  Patient presents with  . Follow-up    HPI:   Elizabeth Oneal is a 82 y.o. female presenting today with a history of dementia, hospitalized at [facility] 01/27/16- 01/31/16 with pneumonia, Cdiff colitis (which was diagnosed in Feb 2017). She had been placed on a Vanc taper in March 2017, to be completed around mid to late April.She has a remote history of microscopic colitis. Due to persistent vomiting, dysphagia, underwent EGD recently with findings of severe peptic stricture s/p dilation. She returns in follow-up. No family is physically present, but I spoke with Joneen Caraway, her grandson, on the phone after the visit.  Patient is in good spirits today and denies any issues. Denies abdominal pain, vomiting. I called the facility, who states patient has been tolerating her diet and eating without evidence of choking or dysphagia. Eating very small amounts. Her weight is stable from her last visit.  Med list reviewed and does not appear to be on Protonix as prescribed. No entocort, although the grandson states it was restarted and he gave this to her recently. Unclear reports, conflicting reports on medication. The medication list I have is dated June 24th, 2017. Nursing home is unsure about bowel habits, but Joneen Caraway states she has intermittent loose stool. She had a positive Cdiff PCR but NEGATIVE antigen. It was felt best to avoid antibiotics at this point, as this can continue to be positive for several months following an infection due to shedding of spores.   Past Medical History  Diagnosis Date  . Hyperlipidemia   . Essential hypertension   . Degenerative disc disease, lumbar   . C. difficile colitis   . Reflux   . Hiatal hernia   . History of pneumonia     Past Surgical History  Procedure Laterality Date  . Abdominal hysterectomy    .  Cholecystectomy    . Appendectomy    . Abdominal surgery    . Fracture surgery    . Cataract surgery    . Joint replacement    . Colon surgery  2009  . Colonoscopy  2009    Normal rectum, s/p colonic biopsy: microscopic colitis   . Esophagogastroduodenoscopy  2009    Schatzki's ring s/p dilation, small hiatal hernia  . Esophagogastroduodenoscopy N/A 04/21/2016    Dr. Gala Romney: severe peptic stricture s/p dilation, reflux esophagitis, moderate sized hiatal hernia  . Maloney dilation N/A 04/21/2016    Procedure: Venia Minks DILATION;  Surgeon: Daneil Dolin, MD;  Location: [facility];  Service: Endoscopy;  Laterality: N/A;    Current Outpatient Prescriptions  Medication Sig Dispense Refill  . acetaminophen (TYLENOL) 500 MG tablet Take 500 mg by mouth every 6 (six) hours as needed. Takes 2 tablets daily    . aspirin EC 81 MG EC tablet Take 1 tablet (81 mg total) by mouth daily.    . clopidogrel (PLAVIX) 75 MG tablet Take 1 tablet (75 mg total) by mouth daily.    . furosemide (LASIX) 20 MG tablet Take 1 tablet (20 mg total) by mouth daily.    . metoprolol tartrate (LOPRESSOR) 25 MG tablet Take 0.5 tablets (12.5 mg total) by mouth 2 (two) times daily. 180 tablet 3  . Multiple Vitamins-Minerals (MULTIVITAMIN WITH MINERALS) tablet Take 1 tablet by mouth daily.    . ondansetron (ZOFRAN) 4 MG tablet Take 1 tablet (4 mg  total) by mouth 4 (four) times daily -  before meals and at bedtime. 120 tablet 1  . potassium chloride (KLOR-CON) 20 MEQ packet Take 20 mEq by mouth daily. Reported on 05/10/2016    . pantoprazole (PROTONIX) 40 MG tablet Take 1 tablet (40 mg total) by mouth daily. 30 minutes before breakfast 90 tablet 3   No current facility-administered medications for this visit.    Allergies as of 05/10/2016 - Review Complete 05/10/2016  Allergen Reaction Noted  . Celebrex [celecoxib]  02/28/2013  . Codeine Other (See Comments)   . Lipitor [atorvastatin]  02/28/2013  . Morphine and related   07/30/2012  . Prozac [fluoxetine hcl]  02/28/2013  . Vioxx [rofecoxib]  02/28/2013  . Xanax [alprazolam] Other (See Comments) 03/26/2012    Family History  Problem Relation Age of Onset  . Heart disease Sister     Social History   Social History  . Marital Status: Widowed    Spouse Name: N/A  . Number of Children: N/A  . Years of Education: N/A   Social History Main Topics  . Smoking status: Never Smoker   . Smokeless tobacco: Not on file  . Alcohol Use: No  . Drug Use: No  . Sexual Activity: Not on file   Other Topics Concern  . Not on file   Social History Narrative    Review of Systems: Unable to obtain due to history of dementia.   Physical Exam: Wt 128 lb (58.06 kg) General:   Alert and oriented to person only. Quite pleasant today. No distress.  Head:  Normocephalic and atraumatic. Abdomen:  +BS, soft, non-tender and non-distended. No rebound or guarding. No HSM or masses noted. Extremities:  Without edema. Neurologic:  Alert and  oriented to person only.  Psych:  Alert and cooperative. Normal mood and affect.

## 2016-05-14 ENCOUNTER — Encounter (HOSPITAL_COMMUNITY): Payer: Self-pay

## 2016-05-14 ENCOUNTER — Inpatient Hospital Stay (HOSPITAL_COMMUNITY)
Admission: EM | Admit: 2016-05-14 | Discharge: 2016-05-16 | DRG: 871 | Disposition: A | Discharge status: Hospice | Payer: Medicare Other | Attending: Internal Medicine | Admitting: Internal Medicine

## 2016-05-14 ENCOUNTER — Emergency Department (HOSPITAL_COMMUNITY): Payer: Medicare Other

## 2016-05-14 DIAGNOSIS — I447 Left bundle-branch block, unspecified: Secondary | ICD-10-CM | POA: Diagnosis present

## 2016-05-14 DIAGNOSIS — I5022 Chronic systolic (congestive) heart failure: Secondary | ICD-10-CM | POA: Diagnosis present

## 2016-05-14 DIAGNOSIS — Z8249 Family history of ischemic heart disease and other diseases of the circulatory system: Secondary | ICD-10-CM

## 2016-05-14 DIAGNOSIS — I252 Old myocardial infarction: Secondary | ICD-10-CM

## 2016-05-14 DIAGNOSIS — D7589 Other specified diseases of blood and blood-forming organs: Secondary | ICD-10-CM | POA: Diagnosis present

## 2016-05-14 DIAGNOSIS — Z7189 Other specified counseling: Secondary | ICD-10-CM | POA: Insufficient documentation

## 2016-05-14 DIAGNOSIS — Z7982 Long term (current) use of aspirin: Secondary | ICD-10-CM | POA: Diagnosis not present

## 2016-05-14 DIAGNOSIS — I2489 Other forms of acute ischemic heart disease: Secondary | ICD-10-CM

## 2016-05-14 DIAGNOSIS — E861 Hypovolemia: Secondary | ICD-10-CM | POA: Diagnosis present

## 2016-05-14 DIAGNOSIS — Z8619 Personal history of other infectious and parasitic diseases: Secondary | ICD-10-CM

## 2016-05-14 DIAGNOSIS — D649 Anemia, unspecified: Secondary | ICD-10-CM | POA: Diagnosis present

## 2016-05-14 DIAGNOSIS — I130 Hypertensive heart and chronic kidney disease with heart failure and stage 1 through stage 4 chronic kidney disease, or unspecified chronic kidney disease: Secondary | ICD-10-CM | POA: Diagnosis present

## 2016-05-14 DIAGNOSIS — Z7902 Long term (current) use of antithrombotics/antiplatelets: Secondary | ICD-10-CM | POA: Diagnosis not present

## 2016-05-14 DIAGNOSIS — I248 Other forms of acute ischemic heart disease: Secondary | ICD-10-CM | POA: Diagnosis present

## 2016-05-14 DIAGNOSIS — Z8701 Personal history of pneumonia (recurrent): Secondary | ICD-10-CM

## 2016-05-14 DIAGNOSIS — F0390 Unspecified dementia without behavioral disturbance: Secondary | ICD-10-CM | POA: Diagnosis present

## 2016-05-14 DIAGNOSIS — A09 Infectious gastroenteritis and colitis, unspecified: Secondary | ICD-10-CM

## 2016-05-14 DIAGNOSIS — A419 Sepsis, unspecified organism: Principal | ICD-10-CM | POA: Diagnosis present

## 2016-05-14 DIAGNOSIS — N183 Chronic kidney disease, stage 3 unspecified: Secondary | ICD-10-CM | POA: Diagnosis present

## 2016-05-14 DIAGNOSIS — Z66 Do not resuscitate: Secondary | ICD-10-CM | POA: Diagnosis present

## 2016-05-14 DIAGNOSIS — N179 Acute kidney failure, unspecified: Secondary | ICD-10-CM | POA: Diagnosis present

## 2016-05-14 DIAGNOSIS — J9601 Acute respiratory failure with hypoxia: Secondary | ICD-10-CM | POA: Diagnosis present

## 2016-05-14 DIAGNOSIS — E860 Dehydration: Secondary | ICD-10-CM | POA: Diagnosis present

## 2016-05-14 DIAGNOSIS — K219 Gastro-esophageal reflux disease without esophagitis: Secondary | ICD-10-CM | POA: Diagnosis present

## 2016-05-14 DIAGNOSIS — Z515 Encounter for palliative care: Secondary | ICD-10-CM | POA: Diagnosis present

## 2016-05-14 DIAGNOSIS — D75839 Thrombocytosis, unspecified: Secondary | ICD-10-CM | POA: Diagnosis present

## 2016-05-14 DIAGNOSIS — R4182 Altered mental status, unspecified: Secondary | ICD-10-CM | POA: Diagnosis not present

## 2016-05-14 DIAGNOSIS — N390 Urinary tract infection, site not specified: Secondary | ICD-10-CM | POA: Insufficient documentation

## 2016-05-14 DIAGNOSIS — E871 Hypo-osmolality and hyponatremia: Secondary | ICD-10-CM | POA: Diagnosis present

## 2016-05-14 DIAGNOSIS — E785 Hyperlipidemia, unspecified: Secondary | ICD-10-CM | POA: Diagnosis present

## 2016-05-14 DIAGNOSIS — I2510 Atherosclerotic heart disease of native coronary artery without angina pectoris: Secondary | ICD-10-CM | POA: Diagnosis present

## 2016-05-14 DIAGNOSIS — D473 Essential (hemorrhagic) thrombocythemia: Secondary | ICD-10-CM | POA: Diagnosis present

## 2016-05-14 DIAGNOSIS — R6521 Severe sepsis with septic shock: Secondary | ICD-10-CM | POA: Diagnosis present

## 2016-05-14 DIAGNOSIS — R197 Diarrhea, unspecified: Secondary | ICD-10-CM | POA: Diagnosis present

## 2016-05-14 LAB — CBC WITH DIFFERENTIAL/PLATELET
BASOS PCT: 0 %
Basophils Absolute: 0 10*3/uL (ref 0.0–0.1)
Eosinophils Absolute: 0 10*3/uL (ref 0.0–0.7)
Eosinophils Relative: 0 %
HEMATOCRIT: 31.3 % — AB (ref 36.0–46.0)
HEMOGLOBIN: 10.6 g/dL — AB (ref 12.0–15.0)
LYMPHS ABS: 0.8 10*3/uL (ref 0.7–4.0)
LYMPHS PCT: 4 %
MCH: 28.6 pg (ref 26.0–34.0)
MCHC: 33.9 g/dL (ref 30.0–36.0)
MCV: 84.4 fL (ref 78.0–100.0)
Monocytes Absolute: 0.8 10*3/uL (ref 0.1–1.0)
Monocytes Relative: 3 %
NEUTROS ABS: 20.7 10*3/uL — AB (ref 1.7–7.7)
NEUTROS PCT: 93 %
Platelets: 459 10*3/uL — ABNORMAL HIGH (ref 150–400)
RBC: 3.71 MIL/uL — ABNORMAL LOW (ref 3.87–5.11)
RDW: 15.4 % (ref 11.5–15.5)
WBC: 22.2 10*3/uL — ABNORMAL HIGH (ref 4.0–10.5)

## 2016-05-14 LAB — URINALYSIS, ROUTINE W REFLEX MICROSCOPIC
BILIRUBIN URINE: NEGATIVE
Glucose, UA: NEGATIVE mg/dL
HGB URINE DIPSTICK: NEGATIVE
KETONES UR: NEGATIVE mg/dL
Leukocytes, UA: NEGATIVE
NITRITE: NEGATIVE
PH: 5.5 (ref 5.0–8.0)
Protein, ur: 30 mg/dL — AB
SPECIFIC GRAVITY, URINE: 1.02 (ref 1.005–1.030)

## 2016-05-14 LAB — URINE MICROSCOPIC-ADD ON: RBC / HPF: NONE SEEN RBC/hpf (ref 0–5)

## 2016-05-14 LAB — COMPREHENSIVE METABOLIC PANEL
ALBUMIN: 2 g/dL — AB (ref 3.5–5.0)
ALK PHOS: 111 U/L (ref 38–126)
ALT: 16 U/L (ref 14–54)
ANION GAP: 10 (ref 5–15)
AST: 28 U/L (ref 15–41)
BUN: 31 mg/dL — ABNORMAL HIGH (ref 6–20)
CHLORIDE: 96 mmol/L — AB (ref 101–111)
CO2: 23 mmol/L (ref 22–32)
Calcium: 7.9 mg/dL — ABNORMAL LOW (ref 8.9–10.3)
Creatinine, Ser: 1.57 mg/dL — ABNORMAL HIGH (ref 0.44–1.00)
GFR calc non Af Amer: 26 mL/min — ABNORMAL LOW (ref >60)
GFR, EST AFRICAN AMERICAN: 30 mL/min — AB (ref >60)
GLUCOSE: 124 mg/dL — AB (ref 65–99)
POTASSIUM: 4.3 mmol/L (ref 3.5–5.1)
SODIUM: 129 mmol/L — AB (ref 135–145)
Total Bilirubin: 0.7 mg/dL (ref 0.3–1.2)
Total Protein: 4.9 g/dL — ABNORMAL LOW (ref 6.5–8.1)

## 2016-05-14 LAB — LACTIC ACID, PLASMA: LACTIC ACID, VENOUS: 1.3 mmol/L (ref 0.5–1.9)

## 2016-05-14 LAB — APTT: APTT: 44 s — AB (ref 24–37)

## 2016-05-14 LAB — PROTIME-INR
INR: 1.48 (ref 0.00–1.49)
Prothrombin Time: 18 seconds — ABNORMAL HIGH (ref 11.6–15.2)

## 2016-05-14 LAB — I-STAT CG4 LACTIC ACID, ED: LACTIC ACID, VENOUS: 3.41 mmol/L — AB (ref 0.5–1.9)

## 2016-05-14 MED ORDER — SERTRALINE HCL 50 MG PO TABS
25.0000 mg | ORAL_TABLET | Freq: Every day | ORAL | Status: DC
  Administered 2016-05-15: 25 mg via ORAL
  Filled 2016-05-14: qty 1

## 2016-05-14 MED ORDER — SODIUM CHLORIDE 0.9 % IV SOLN
INTRAVENOUS | Status: AC
  Administered 2016-05-14 – 2016-05-15 (×3): via INTRAVENOUS

## 2016-05-14 MED ORDER — VANCOMYCIN 50 MG/ML ORAL SOLUTION
125.0000 mg | Freq: Four times a day (QID) | ORAL | Status: DC
  Administered 2016-05-15 – 2016-05-16 (×6): 125 mg via ORAL
  Filled 2016-05-14 (×15): qty 2.5

## 2016-05-14 MED ORDER — ADULT MULTIVITAMIN W/MINERALS CH
ORAL_TABLET | Freq: Every day | ORAL | Status: DC
  Filled 2016-05-14: qty 1

## 2016-05-14 MED ORDER — CLOPIDOGREL BISULFATE 75 MG PO TABS
75.0000 mg | ORAL_TABLET | Freq: Every day | ORAL | Status: DC
  Administered 2016-05-15: 75 mg via ORAL
  Filled 2016-05-14: qty 1

## 2016-05-14 MED ORDER — VANCOMYCIN HCL IN DEXTROSE 1-5 GM/200ML-% IV SOLN
1000.0000 mg | Freq: Once | INTRAVENOUS | Status: AC
Start: 2016-05-14 — End: 2016-05-14
  Administered 2016-05-14: 1000 mg via INTRAVENOUS
  Filled 2016-05-14: qty 200

## 2016-05-14 MED ORDER — LORAZEPAM 2 MG/ML IJ SOLN: 0.5000 mg | INTRAMUSCULAR | Status: DC | PRN

## 2016-05-14 MED ORDER — SODIUM CHLORIDE 0.9% FLUSH
3.0000 mL | Freq: Two times a day (BID) | INTRAVENOUS | Status: DC
  Administered 2016-05-15: 3 mL via INTRAVENOUS

## 2016-05-14 MED ORDER — HEPARIN SODIUM (PORCINE) 5000 UNIT/ML IJ SOLN
5000.0000 [IU] | Freq: Three times a day (TID) | INTRAMUSCULAR | Status: DC
  Administered 2016-05-15 – 2016-05-16 (×4): 5000 [IU] via SUBCUTANEOUS
  Filled 2016-05-14 (×4): qty 1

## 2016-05-14 MED ORDER — PIPERACILLIN-TAZOBACTAM 3.375 G IVPB 30 MIN
3.3750 g | Freq: Once | INTRAVENOUS | Status: AC
  Administered 2016-05-14: 3.375 g via INTRAVENOUS
  Filled 2016-05-14: qty 50

## 2016-05-14 MED ORDER — NITROGLYCERIN 0.4 MG SL SUBL: 0.4000 mg | SUBLINGUAL_TABLET | SUBLINGUAL | Status: DC | PRN

## 2016-05-14 MED ORDER — SODIUM CHLORIDE 0.9 % IV BOLUS (SEPSIS)
1000.0000 mL | Freq: Once | INTRAVENOUS | Status: AC
  Administered 2016-05-14: 1000 mL via INTRAVENOUS

## 2016-05-14 MED ORDER — GUAIFENESIN ER 600 MG PO TB12
600.0000 mg | ORAL_TABLET | Freq: Two times a day (BID) | ORAL | Status: DC
  Administered 2016-05-15: 600 mg via ORAL
  Filled 2016-05-14 (×2): qty 1

## 2016-05-14 MED ORDER — PANTOPRAZOLE SODIUM 40 MG PO TBEC
40.0000 mg | DELAYED_RELEASE_TABLET | Freq: Every day | ORAL | Status: DC
  Filled 2016-05-14: qty 1

## 2016-05-14 MED ORDER — SODIUM CHLORIDE 0.9 % IV BOLUS (SEPSIS)
2000.0000 mL | Freq: Once | INTRAVENOUS | Status: AC
  Administered 2016-05-14: 2000 mL via INTRAVENOUS

## 2016-05-14 MED ORDER — HYDROMORPHONE HCL 1 MG/ML IJ SOLN: 0.5000 mg | INTRAMUSCULAR | Status: DC | PRN

## 2016-05-14 MED ORDER — CETYLPYRIDINIUM CHLORIDE 0.05 % MT LIQD
7.0000 mL | Freq: Two times a day (BID) | OROMUCOSAL | Status: DC
  Administered 2016-05-14 – 2016-05-15 (×2): 7 mL via OROMUCOSAL

## 2016-05-14 MED ORDER — ASPIRIN EC 81 MG PO TBEC
81.0000 mg | DELAYED_RELEASE_TABLET | Freq: Every day | ORAL | Status: DC
  Administered 2016-05-15: 81 mg via ORAL
  Filled 2016-05-14: qty 1

## 2016-05-14 NOTE — ED Notes (Signed)
Patient from [place] c/o low blood pressure, high heart rate. Patient is non verbal and noes not follow commands at this time,

## 2016-05-14 NOTE — H&P (Signed)
History and Physical    Elizabeth Oneal [id number] DOB: 1916-01-27 DOA: 05/14/2016  PCP: Hilbert Corrigan, MD   Patient coming from: SNF  Chief Complaint: Hypotension, tachycardia   HPI: Elizabeth Oneal is a 82 y.o. female with medical history significant for hypertension, hyperlipidemia, chronic kidney disease stage III, C. difficile colitis, and recent non-STEMI who presents from her SNF for evaluation of hypotension and tachycardia. History is obtained from the patient's son and grandsons at bedside, discussion with the ED personnel, and review of the electronic medical record. She had reportedly been doing well at the SNF, though with persistent diarrhea for the past year despite treatment for C. difficile colitis. She was noted to become much more lethargic over the past day, has not been speaking, and developed hypotension and tachycardia, prompting activation of EMS for transport to the hospital. She had reportedly been refusing food for several days prior to the onset of lethargy. She has recently completed a taper of oral vancomycin. C. difficile antigen was positive, but with no detectable toxin on 04/21/2016. GI advised against further antibiotic treatment for C. difficile at that time. There is been no documented fevers and the patient has not been complaining of pain. She has not been vomiting. Melena or hematochezia are denied.  ED Course: Upon arrival to the ED, patient is found to be afebrile, saturating 99% on room air, tachypneic to 34, tachycardic to 122, and with blood pressure 55/31. EKG demonstrates sinus tachycardia with left bundle branch block and chest x-rays negative for acute cardiopulmonary disease. CMP is notable for a sodium of 129 and serum creatinine 1.57, up from recent value of 1.15. CBC features a leukocytosis to 22,200, hemoglobin of 10.6, and platelet count of 459,000. Lactic acid is elevated to 3.41, and urinalysis features many bacteria, 6-30 WBC, negative  leukocyte, and negative nitrite. Blood and urine cultures were obtained, patient was given 30 cc/kg normal saline bolus, and empiric vancomycin and Zosyn were administered. Patient's tachycardia improved with the fluid bolus but she remained hypotensive and additional fluids were ordered. It was discussed with the patient's family at bedside that she will likely not survive this illness. Plan for additional IV fluid and antibiotics was discussed with family and they confirmed that the patient would not want her care escalated beyond that. Patient appears comfortable at this time and will be admitted to the stepdown unit for ongoing evaluation and management of septic shock with source not yet identified.  Review of Systems:  All other systems reviewed and apart from HPI, are negative.  Past Medical History  Diagnosis Date  . Hyperlipidemia   . Essential hypertension   . Degenerative disc disease, lumbar   . C. difficile colitis   . Reflux   . Hiatal hernia   . History of pneumonia     Past Surgical History  Procedure Laterality Date  . Abdominal hysterectomy    . Cholecystectomy    . Appendectomy    . Abdominal surgery    . Fracture surgery    . Cataract surgery    . Joint replacement    . Colon surgery  2009  . Colonoscopy  2009    Normal rectum, s/p colonic biopsy: microscopic colitis   . Esophagogastroduodenoscopy  2009    Schatzki's ring s/p dilation, small hiatal hernia  . Esophagogastroduodenoscopy N/A 04/21/2016    Dr. Gala Romney: severe peptic stricture s/p dilation, reflux esophagitis, moderate sized hiatal hernia  . Maloney dilation N/A 04/21/2016    Procedure:  MALONEY DILATION;  Surgeon: Daneil Dolin, MD;  Location: [facility];  Service: Endoscopy;  Laterality: N/A;     reports that she has never smoked. She does not have any smokeless tobacco history on file. She reports that she does not drink alcohol or use illicit drugs.  Allergies  Allergen Reactions  . Celebrex  [Celecoxib]   . Codeine Other (See Comments)    Goes out of right state of mind.   . Lipitor [Atorvastatin]   . Morphine And Related   . Prozac [Fluoxetine Hcl]   . Vioxx [Rofecoxib]   . Xanax [Alprazolam] Other (See Comments)    Goes out of right state of mind.     Family History  Problem Relation Age of Onset  . Heart disease Sister      Prior to Admission medications   Medication Sig Start Date End Date Taking? Authorizing Provider  acetaminophen (TYLENOL) 500 MG tablet Take 500 mg by mouth every 6 (six) hours as needed. Takes 2 tablets daily   Yes Historical Provider, MD  Amino Acids (AMINO ACID PO) Take 40 mg by mouth daily as needed.   Yes Historical Provider, MD  aspirin EC 81 MG EC tablet Take 1 tablet (81 mg total) by mouth daily. 01/01/16  Yes Samuella Cota, MD  clopidogrel (PLAVIX) 75 MG tablet Take 1 tablet (75 mg total) by mouth daily. 01/01/16  Yes Samuella Cota, MD  furosemide (LASIX) 20 MG tablet Take 1 tablet (20 mg total) by mouth daily. 01/01/16  Yes Samuella Cota, MD  guaifenesin (HUMIBID E) 400 MG TABS tablet Take 400 mg by mouth 2 (two) times daily.   Yes Historical Provider, MD  metoprolol tartrate (LOPRESSOR) 25 MG tablet Take 0.5 tablets (12.5 mg total) by mouth 2 (two) times daily. 02/04/16  Yes Lendon Colonel, NP  Multiple Vitamins-Minerals (MULTIVITAMIN WITH MINERALS) tablet Take 1 tablet by mouth daily.   Yes Historical Provider, MD  nitroGLYCERIN (NITROSTAT) 0.4 MG SL tablet Place 0.4 mg under the tongue every 5 (five) minutes as needed for chest pain.   Yes Historical Provider, MD  ondansetron (ZOFRAN) 4 MG tablet Take 1 tablet (4 mg total) by mouth 4 (four) times daily -  before meals and at bedtime. 02/12/16  Yes Orvil Feil, NP  pantoprazole (PROTONIX) 40 MG tablet Take 1 tablet (40 mg total) by mouth daily. 30 minutes before breakfast 05/10/16  Yes Orvil Feil, NP  potassium chloride (KLOR-CON) 20 MEQ packet Take 20 mEq by mouth daily.  Reported on 05/10/2016   Yes Historical Provider, MD  sertraline (ZOLOFT) 25 MG tablet Take 25 mg by mouth daily.  05/10/16  Yes Historical Provider, MD    Physical Exam: Filed Vitals:   05/14/16 2150 05/14/16 2200 05/14/16 2210 05/14/16 2220  BP: 75/42 64/36 62/31  58/32  Pulse:      Temp:      TempSrc:      Resp: 35 16 22 21   SpO2:          Constitutional: NAD, calm, comfortable, pale Eyes: PERTLA, lids and conjunctivae normal ENMT: Mucous membranes are dry. Posterior pharynx clear of any exudate or lesions.   Neck: normal, supple, no masses, no thyromegaly Respiratory: Coarse rhonchi bilaterally. Normal respiratory effort. No accessory muscle use.  Cardiovascular: Rate ~120 and regular, no significant murmur. No extremity edema. No significant JVD. Abdomen: No distension, no tenderness, no masses palpated. Bowel sounds normal.  Musculoskeletal: no clubbing / cyanosis. No  joint deformity upper and lower extremities. Normal muscle tone.  Skin: no significant rashes, lesions, ulcers. Warm, dry, well-perfused. Neurologic: Opens eyes to verbal stimuli, but not verbally responsive and not following commands. PERRL, EOMI, moving all extremities spontaneously, patellar DTR's active and symmetric, Babinski down-going bilaterally  Psychiatric: Difficult to assess given the clinical scenario.     Labs on Admission: I have personally reviewed following labs and imaging studies  CBC:  Recent Labs Lab 05/14/16 2015  WBC 22.2*  NEUTROABS 20.7*  HGB 10.6*  HCT 31.3*  MCV 84.4  PLT [id number]*   Basic Metabolic Panel:  Recent Labs Lab 05/14/16 2015  NA 129*  K 4.3  CL 96*  CO2 23  GLUCOSE 124*  BUN 31*  CREATININE 1.57*  CALCIUM 7.9*   GFR: Estimated Creatinine Clearance: 15.5 mL/min (by C-G formula based on Cr of 1.57). Liver Function Tests:  Recent Labs Lab 05/14/16 2015  AST 28  ALT 16  ALKPHOS 111  BILITOT 0.7  PROT 4.9*  ALBUMIN 2.0*   No results for input(s):  LIPASE, AMYLASE in the last 168 hours. No results for input(s): AMMONIA in the last 168 hours. Coagulation Profile: No results for input(s): INR, PROTIME in the last 168 hours. Cardiac Enzymes: No results for input(s): CKTOTAL, CKMB, CKMBINDEX, TROPONINI in the last 168 hours. BNP (last 3 results) No results for input(s): PROBNP in the last 8760 hours. HbA1C: No results for input(s): HGBA1C in the last 72 hours. CBG: No results for input(s): GLUCAP in the last 168 hours. Lipid Profile: No results for input(s): CHOL, HDL, LDLCALC, TRIG, CHOLHDL, LDLDIRECT in the last 72 hours. Thyroid Function Tests: No results for input(s): TSH, T4TOTAL, FREET4, T3FREE, THYROIDAB in the last 72 hours. Anemia Panel: No results for input(s): VITAMINB12, FOLATE, FERRITIN, TIBC, IRON, RETICCTPCT in the last 72 hours. Urine analysis:    Component Value Date/Time   COLORURINE YELLOW 05/14/2016 2011   APPEARANCEUR HAZY* 05/14/2016 2011   LABSPEC 1.020 05/14/2016 2011   PHURINE 5.5 05/14/2016 2011   GLUCOSEU NEGATIVE 05/14/2016 2011   HGBUR NEGATIVE 05/14/2016 2011   [place] 05/14/2016 2011   KETONESUR NEGATIVE 05/14/2016 2011   PROTEINUR 30* 05/14/2016 2011   UROBILINOGEN 0.2 02/11/2010 1450   NITRITE NEGATIVE 05/14/2016 2011   LEUKOCYTESUR NEGATIVE 05/14/2016 2011   Sepsis Labs: @LABRCNTIP (procalcitonin:4,lacticidven:4) )No results found for this or any previous visit (from the past 240 hour(s)).   Radiological Exams on Admission: Dg Chest Portable 1 View  05/14/2016  CLINICAL DATA:  Low blood pressure and tachycardia. EXAM: PORTABLE CHEST 1 VIEW COMPARISON:  01/27/2016 FINDINGS: 2012 hours. Low volume film. Interstitial markings are diffusely coarsened with chronic features. No overt airspace pulmonary edema or focal lung consolidation. No pleural effusion. Telemetry leads overlie the chest. IMPRESSION: Stable exam.  Low volumes with chronic interstitial disease. Electronically Signed    By: Misty Stanley M.D.   On: 05/14/2016 20:35    EKG: Independently reviewed. Sinus tachycardia (rate 123), LBBB  Assessment/Plan  1. Septic shock  - Meets criteria with leukocytosis, tachycardia, elevated lactate, AKI, and hypotension despite 30 cc/kg NS bolus  - Source not yet identified; with acute hypoxic respiratory failure, PNA is possible, though not clearly evident on admission CXR  - Urinary source possible, though UA negative for nitrites or leukocytes  - C diff colitis certainly a possibility  - Blood and urine cultures are incubating, 30 cc/kg bolus given, started on empiric vancomycin and Zosyn, will continue  - Add oral vancomycin  for possibly C diff  - Check GI pathogen panel, trend lactate and PCT  - Continue IVF, family opposed to escalating further   2. Acute respiratory failure with hypoxia  - Secondary to sepsis, saturating mid-90's on nasal canula with no increased WOB  - PNA not clearly evident on CXR but remains a consideration and is covered with vanc and Zosyn  - Check urine for antigens to strep pneumo; check sputum culture if she is able to produce  - Continuous pulse oximetry with titration of FiO2 to maintain sats >92%; pt is DNI    3. AKI superimposed on CKD stage III  - SCr 1.57 on admission, up from 1.15 recently  - Suspect prerenal azotemia in setting of sepsis; ATN certainly possible  - Given aggressive IVF resuscitation  - Repeat chem panel in am    4. Hyponatremia  - Serum sodium 129 on admission in setting of dehydration  - Received 30 cc/kg NS in ED, continue fluid-resuscitation  - Repeat chem panel in am   5. Normocytic anemia, thrombocytosis  - Hgb 10.6 on admission, apparently stable, no sign of active losses  - Platelet count 459,000 on admission, likely representing acute phase reaction   6. Chronic systolic CHF  - Dehydrated on admission and undergoing aggressive fluid resuscitation  - TTE (12/27/15) with EF 30-35%, severe  hypokinesis, mild TR, mild PR  - Managed with Lopressor and Lasix at the SNF; holding these on admission in setting of septic shock  - Follow daily wts and strict I/O's    7. CAD - Suffered NSTEMI earlier this year  - Continue current management with ASA 81 and Plavix - LBBB on admission EKG, previously had incomplete LBBB; check troponin, anticipate will be high, may need heparin gtt, family opposed to any invasive studies/treatments   DVT prophylaxis: sq heparin  Code Status: DNR  Family Communication: Grandchildren and son updated at bedside  Disposition Plan: Admit to stepdown  Consults called: None  Admission status: Inpatient    Vianne Bulls, MD [facility] Pager [phone]  If 7PM-7AM, please contact night-coverage [email or web address] Password [facility]  05/14/2016, 10:36 PM

## 2016-05-15 DIAGNOSIS — I248 Other forms of acute ischemic heart disease: Secondary | ICD-10-CM

## 2016-05-15 DIAGNOSIS — A419 Sepsis, unspecified organism: Principal | ICD-10-CM

## 2016-05-15 DIAGNOSIS — J9601 Acute respiratory failure with hypoxia: Secondary | ICD-10-CM

## 2016-05-15 DIAGNOSIS — R6521 Severe sepsis with septic shock: Secondary | ICD-10-CM

## 2016-05-15 LAB — COMPREHENSIVE METABOLIC PANEL
ALK PHOS: 82 U/L (ref 38–126)
ALT: 15 U/L (ref 14–54)
AST: 31 U/L (ref 15–41)
Albumin: 1.5 g/dL — ABNORMAL LOW (ref 3.5–5.0)
Anion gap: 4 — ABNORMAL LOW (ref 5–15)
BILIRUBIN TOTAL: 0.7 mg/dL (ref 0.3–1.2)
BUN: 29 mg/dL — AB (ref 6–20)
CALCIUM: 7 mg/dL — AB (ref 8.9–10.3)
CO2: 22 mmol/L (ref 22–32)
Chloride: 108 mmol/L (ref 101–111)
Creatinine, Ser: 1.23 mg/dL — ABNORMAL HIGH (ref 0.44–1.00)
GFR calc Af Amer: 41 mL/min — ABNORMAL LOW (ref >60)
GFR, EST NON AFRICAN AMERICAN: 35 mL/min — AB (ref >60)
Glucose, Bld: 113 mg/dL — ABNORMAL HIGH (ref 65–99)
POTASSIUM: 3.9 mmol/L (ref 3.5–5.1)
Sodium: 134 mmol/L — ABNORMAL LOW (ref 135–145)
TOTAL PROTEIN: 3.7 g/dL — AB (ref 6.5–8.1)

## 2016-05-15 LAB — TROPONIN I
TROPONIN I: 0.2 ng/mL — AB (ref <0.03)
Troponin I: 1.08 ng/mL (ref <0.03)
Troponin I: 1 ng/mL (ref <0.03)

## 2016-05-15 LAB — MRSA PCR SCREENING: MRSA by PCR: POSITIVE — AB

## 2016-05-15 LAB — PROCALCITONIN: PROCALCITONIN: 0.26 ng/mL

## 2016-05-15 LAB — LACTIC ACID, PLASMA: LACTIC ACID, VENOUS: 1.1 mmol/L (ref 0.5–1.9)

## 2016-05-15 MED ORDER — VANCOMYCIN HCL 500 MG IV SOLR
500.0000 mg | INTRAVENOUS | Status: DC
  Filled 2016-05-15: qty 500

## 2016-05-15 MED ORDER — SODIUM CHLORIDE 0.9 % IV SOLN
INTRAVENOUS | Status: DC
  Administered 2016-05-15: 14:00:00 via INTRAVENOUS

## 2016-05-15 MED ORDER — PIPERACILLIN-TAZOBACTAM 3.375 G IVPB
3.3750 g | Freq: Three times a day (TID) | INTRAVENOUS | Status: DC
  Administered 2016-05-15 – 2016-05-16 (×4): 3.375 g via INTRAVENOUS
  Filled 2016-05-15 (×4): qty 50

## 2016-05-15 MED ORDER — CHLORHEXIDINE GLUCONATE CLOTH 2 % EX PADS
6.0000 | MEDICATED_PAD | Freq: Every day | CUTANEOUS | Status: DC
  Administered 2016-05-16: 6 via TOPICAL

## 2016-05-15 MED ORDER — MUPIROCIN 2 % EX OINT
1.0000 "application " | TOPICAL_OINTMENT | Freq: Two times a day (BID) | CUTANEOUS | Status: DC
  Administered 2016-05-15 – 2016-05-16 (×3): 1 via NASAL
  Filled 2016-05-15 (×2): qty 22

## 2016-05-15 MED ORDER — VANCOMYCIN 50 MG/ML ORAL SOLUTION
ORAL | Status: AC
Start: 2016-05-15 — End: 2016-05-15
  Filled 2016-05-15: qty 5

## 2016-05-15 NOTE — ED Provider Notes (Signed)
CSN: [id number]     Arrival date & time 05/14/16  1949 History   First MD Initiated Contact with Patient 05/14/16 2004     Chief Complaint  Patient presents with  . Hypotension     (Consider location/radiation/quality/duration/timing/severity/associated sxs/prior Treatment) Patient is a 82 y.o. female presenting with altered mental status. The history is provided by the nursing home (Patient was found to be less responsive at the nursing home and her blood pressure was low. She's been having diarrhea for a long time AND HAD POSITIVE C. DIFFICILE).  Altered Mental Status Presenting symptoms: lethargy   Severity:  Severe Most recent episode:  Today Episode history:  Continuous Timing:  Constant Chronicity:  New Context: dementia     Past Medical History  Diagnosis Date  . Hyperlipidemia   . Essential hypertension   . Degenerative disc disease, lumbar   . C. difficile colitis   . Reflux   . Hiatal hernia   . History of pneumonia    Past Surgical History  Procedure Laterality Date  . Abdominal hysterectomy    . Cholecystectomy    . Appendectomy    . Abdominal surgery    . Fracture surgery    . Cataract surgery    . Joint replacement    . Colon surgery  2009  . Colonoscopy  2009    Normal rectum, s/p colonic biopsy: microscopic colitis   . Esophagogastroduodenoscopy  2009    Schatzki's ring s/p dilation, small hiatal hernia  . Esophagogastroduodenoscopy N/A 04/21/2016    Dr. Gala Romney: severe peptic stricture s/p dilation, reflux esophagitis, moderate sized hiatal hernia  . Maloney dilation N/A 04/21/2016    Procedure: Venia Minks DILATION;  Surgeon: Daneil Dolin, MD;  Location: [facility];  Service: Endoscopy;  Laterality: N/A;   Family History  Problem Relation Age of Onset  . Heart disease Sister    Social History  Substance Use Topics  . Smoking status: Never Smoker   . Smokeless tobacco: None  . Alcohol Use: No   OB History    No data available     Review of  Systems  Unable to perform ROS: Mental status change      Allergies  Celebrex; Codeine; Lipitor; Morphine and related; Prozac; Vioxx; and Xanax  Home Medications   Prior to Admission medications   Medication Sig Start Date End Date Taking? Authorizing Provider  acetaminophen (TYLENOL) 500 MG tablet Take 500 mg by mouth every 6 (six) hours as needed. Takes 2 tablets daily   Yes Historical Provider, MD  Amino Acids (AMINO ACID PO) Take 40 mg by mouth daily as needed.   Yes Historical Provider, MD  aspirin EC 81 MG EC tablet Take 1 tablet (81 mg total) by mouth daily. 01/01/16  Yes Samuella Cota, MD  clopidogrel (PLAVIX) 75 MG tablet Take 1 tablet (75 mg total) by mouth daily. 01/01/16  Yes Samuella Cota, MD  furosemide (LASIX) 20 MG tablet Take 1 tablet (20 mg total) by mouth daily. 01/01/16  Yes Samuella Cota, MD  guaifenesin (HUMIBID E) 400 MG TABS tablet Take 400 mg by mouth 2 (two) times daily.   Yes Historical Provider, MD  metoprolol tartrate (LOPRESSOR) 25 MG tablet Take 0.5 tablets (12.5 mg total) by mouth 2 (two) times daily. 02/04/16  Yes Lendon Colonel, NP  Multiple Vitamins-Minerals (MULTIVITAMIN WITH MINERALS) tablet Take 1 tablet by mouth daily.   Yes Historical Provider, MD  nitroGLYCERIN (NITROSTAT) 0.4 MG SL tablet Place  0.4 mg under the tongue every 5 (five) minutes as needed for chest pain.   Yes Historical Provider, MD  ondansetron (ZOFRAN) 4 MG tablet Take 1 tablet (4 mg total) by mouth 4 (four) times daily -  before meals and at bedtime. 02/12/16  Yes Orvil Feil, NP  pantoprazole (PROTONIX) 40 MG tablet Take 1 tablet (40 mg total) by mouth daily. 30 minutes before breakfast 05/10/16  Yes Orvil Feil, NP  potassium chloride (KLOR-CON) 20 MEQ packet Take 20 mEq by mouth daily. Reported on 05/10/2016   Yes Historical Provider, MD  sertraline (ZOLOFT) 25 MG tablet Take 25 mg by mouth daily.  05/10/16  Yes Historical Provider, MD   BP 72/32 mmHg  Pulse 61   Temp(Src) 98.1 F (36.7 C) (Axillary)  Resp 13  Ht 5\' 5"  (1.651 m)  Wt 123 lb 10.9 oz (56.1 kg)  BMI 20.58 kg/m2  SpO2 90% Physical Exam  Constitutional: She appears well-developed.  HENT:  Head: Normocephalic.  Eyes: Conjunctivae and EOM are normal. No scleral icterus.  Neck: Neck supple. No thyromegaly present.  Cardiovascular: Normal rate and regular rhythm.  Exam reveals no gallop and no friction rub.   No murmur heard. Pulmonary/Chest: No stridor. She has no wheezes. She has no rales. She exhibits no tenderness.  Abdominal: She exhibits no distension. There is no tenderness. There is no rebound.  Musculoskeletal: Normal range of motion. She exhibits no edema.  Lymphadenopathy:    She has no cervical adenopathy.  Neurological: She exhibits normal muscle tone. Coordination normal.  Patient lethargic and will not follow any commands  Skin: No rash noted. No erythema.    ED Course  Procedures (including critical care time) Labs Review Labs Reviewed  MRSA PCR SCREENING - Abnormal; Notable for the following:    MRSA by PCR POSITIVE (*)    All other components within normal limits  COMPREHENSIVE METABOLIC PANEL - Abnormal; Notable for the following:    Sodium 129 (*)    Chloride 96 (*)    Glucose, Bld 124 (*)    BUN 31 (*)    Creatinine, Ser 1.57 (*)    Calcium 7.9 (*)    Total Protein 4.9 (*)    Albumin 2.0 (*)    GFR calc non Af Amer 26 (*)    GFR calc Af Amer 30 (*)    All other components within normal limits  CBC WITH DIFFERENTIAL/PLATELET - Abnormal; Notable for the following:    WBC 22.2 (*)    RBC 3.71 (*)    Hemoglobin 10.6 (*)    HCT 31.3 (*)    Platelets 459 (*)    Neutro Abs 20.7 (*)    All other components within normal limits  URINALYSIS, ROUTINE W REFLEX MICROSCOPIC (NOT AT [facility]) - Abnormal; Notable for the following:    APPearance HAZY (*)    Protein, ur 30 (*)    All other components within normal limits  URINE MICROSCOPIC-ADD ON - Abnormal;  Notable for the following:    Squamous Epithelial / LPF 0-5 (*)    Bacteria, UA MANY (*)    Casts HYALINE CASTS (*)    All other components within normal limits  PROTIME-INR - Abnormal; Notable for the following:    Prothrombin Time 18.0 (*)    All other components within normal limits  APTT - Abnormal; Notable for the following:    aPTT 44 (*)    All other components within normal limits  COMPREHENSIVE METABOLIC  PANEL - Abnormal; Notable for the following:    Sodium 134 (*)    Glucose, Bld 113 (*)    BUN 29 (*)    Creatinine, Ser 1.23 (*)    Calcium 7.0 (*)    Total Protein 3.7 (*)    Albumin 1.5 (*)    GFR calc non Af Amer 35 (*)    GFR calc Af Amer 41 (*)    Anion gap 4 (*)    All other components within normal limits  TROPONIN I - Abnormal; Notable for the following:    Troponin I 0.20 (*)    All other components within normal limits  TROPONIN I - Abnormal; Notable for the following:    Troponin I 1.08 (*)    All other components within normal limits  I-STAT CG4 LACTIC ACID, ED - Abnormal; Notable for the following:    Lactic Acid, Venous 3.41 (*)    All other components within normal limits  CULTURE, BLOOD (ROUTINE X 2)  CULTURE, BLOOD (ROUTINE X 2)  URINE CULTURE  CULTURE, EXPECTORATED SPUTUM-ASSESSMENT  C DIFFICILE QUICK SCREEN W PCR REFLEX  GASTROINTESTINAL PANEL BY PCR, STOOL (REPLACES STOOL CULTURE)  LACTIC ACID, PLASMA  LACTIC ACID, PLASMA  PROCALCITONIN  STREP PNEUMONIAE URINARY ANTIGEN  TROPONIN I  MISC [facility] TEST (SEND OUT)    Imaging Review Dg Chest Portable 1 View  05/14/2016  CLINICAL DATA:  Low blood pressure and tachycardia. EXAM: PORTABLE CHEST 1 VIEW COMPARISON:  01/27/2016 FINDINGS: 2012 hours. Low volume film. Interstitial markings are diffusely coarsened with chronic features. No overt airspace pulmonary edema or focal lung consolidation. No pleural effusion. Telemetry leads overlie the chest. IMPRESSION: Stable exam.  Low volumes with  chronic interstitial disease. Electronically Signed   By: Misty Stanley M.D.   On: 05/14/2016 20:35   I have personally reviewed and evaluated these images and lab results as part of my medical decision-making.   EKG Interpretation None     CRITICAL CARE Performed by: Ladena Jacquez L Total critical care time: 45 minutes Critical care time was exclusive of separately billable procedures and treating other patients. Critical care was necessary to treat or prevent imminent or life-threatening deterioration. Critical care was time spent personally by me on the following activities: development of treatment plan with patient and/or surrogate as well as nursing, discussions with consultants, evaluation of patient's response to treatment, examination of patient, obtaining history from patient or surrogate, ordering and performing treatments and interventions, ordering and review of laboratory studies, ordering and review of radiographic studies, pulse oximetry and re-evaluation of patient's condition.   MDM   Final diagnoses:  Sepsis due to urinary tract infection ([place])    Patient is hypotensive and septic shock. Source could be related to the diarrhea. Patient is a DO NOT RESUSCITATE. Family does not want aggressive measures. She will be admitted for IV antibiotics and fluids to the [facility], MD 05/15/16 [phone]

## 2016-05-15 NOTE — Progress Notes (Signed)
PROGRESS NOTE    Elizabeth Oneal  [id number] DOB: 07-02-1916 DOA: 05/14/2016 PCP: Drue Novel, NP     Brief Narrative:  82 y/o woman admitted from SNF on 7/1 after found to be hypotensive and tachycardic. She has had persistent diarrhea for the past year despite adequate treatment for C. difficile colitis. Upon arrival she was found to have a blood pressure of 55/31, tachycardic to 122 and tachypneic to 34. After discussions with family and they agree to fluids and antibiotics though no one her care escalated beyond that. Her troponin has increased to 1.08.   Assessment & Plan:   Principal Problem:   Septic shock (DISH) Active Problems:   Diarrhea   CKD (chronic kidney disease) stage 3, GFR 30-59 ml/min   Acute respiratory failure with hypoxia (HCC)   AKI (acute kidney injury) ([place])   Hyponatremia   Normocytic anemia   Thrombocytosis (HCC)   Chronic systolic CHF (congestive heart failure) (HCC)   CAD (coronary artery disease)   Demand ischemia (HCC)   Septic/hypovolemic shock -Presumed source is abdominal with diarrhea; patient has a history of C. difficile colitis. Most recent C. difficile panel on 6/8 shows a positive antigen but negative toxin at which time treatment for C. difficile was discontinued. -Has received at least 6 L bolus of fluid. Continue maintenance fluid at 75 mL an hour. -Continue Zosyn for intra-abdominal coverage, if ends up being C. difficile positive will discontinue and start oral vancomycin.  Demand ischemia -Troponin has risen to 1.08, does not have acute ischemic abnormalities on EKG and denies any chest pain.  -2-D echo from February 2017 shows an ejection fraction of 30-35% with multiple wall motion abnormalities but not technically sufficient to evaluate LV diastolic function. -We'll discuss with family what further steps they would like to take, I believe the best option at this point is to pursue comfort care and hospice.  Acute on chronic  kidney disease stage III -Creatinine is improving with IV fluids.  Hyponatremia -Due to hypovolemia, improved with IV fluids.  Chronic systolic CHF  -No signs of exacerbation despite aggressive fluid repletion in the face of shock.   DVT prophylaxis: Subcutaneous heparin Code Status: DO NOT RESUSCITATE Family Communication: We'll discuss with family members who are coming after church Disposition Plan: Keep in ICU, request palliative care evaluation, strongly believe we need to pursue comfort care and hospice.  Consultants:   None  Procedures:   None  Antimicrobials:   Zosyn    Subjective: Lying in bed, complains of just feeling tired and weak, no chest pain or shortness of breath, has had 2 episodes of diarrhea since this morning per RN.  Objective: Filed Vitals:   05/15/16 0900 05/15/16 0930 05/15/16 1000 05/15/16 1100  BP: 119/91 94/41 80/40  72/32  Pulse: 43  87 61  Temp:      TempSrc:      Resp: 21 19 16 13   Height:      Weight:      SpO2:   100% 90%    Intake/Output Summary (Last 24 hours) at 05/15/16 1108 Last data filed at 05/15/16 1000  Gross per 24 hour  Intake   2250 ml  Output      7 ml  Net   2243 ml   Filed Weights   05/14/16 2315 05/15/16 0400  Weight: 56.1 kg (123 lb 10.9 oz) 56.1 kg (123 lb 10.9 oz)    Examination:  General exam: Alert, awake, oriented x 3 Respiratory system:  Clear to auscultation. Respiratory effort normal. Cardiovascular system:Tachycardic, regular rhythm No murmurs, rubs, gallops. Gastrointestinal system: Abdomen is nondistended, soft and nontender. No organomegaly or masses felt. Normal bowel sounds heard. Central nervous system: Alert and oriented. No focal neurological deficits. Extremities: No C/C/E, +pedal pulses Skin: No rashes, lesions or ulcers Psychiatry: Judgement and insight appear normal. Mood & affect appropriate.     Data Reviewed: I have personally reviewed following labs and imaging  studies  CBC:  Recent Labs Lab 05/14/16 2015  WBC 22.2*  NEUTROABS 20.7*  HGB 10.6*  HCT 31.3*  MCV 84.4  PLT [id number]*   Basic Metabolic Panel:  Recent Labs Lab 05/14/16 2015 05/15/16 0507  NA 129* 134*  K 4.3 3.9  CL 96* 108  CO2 23 22  GLUCOSE 124* 113*  BUN 31* 29*  CREATININE 1.57* 1.23*  CALCIUM 7.9* 7.0*   GFR: Estimated Creatinine Clearance: 22.6 mL/min (by C-G formula based on Cr of 1.23). Liver Function Tests:  Recent Labs Lab 05/14/16 2015 05/15/16 0507  AST 28 31  ALT 16 15  ALKPHOS 111 82  BILITOT 0.7 0.7  PROT 4.9* 3.7*  ALBUMIN 2.0* 1.5*   No results for input(s): LIPASE, AMYLASE in the last 168 hours. No results for input(s): AMMONIA in the last 168 hours. Coagulation Profile:  Recent Labs Lab 05/14/16 2243  INR 1.48   Cardiac Enzymes:  Recent Labs Lab 05/14/16 2243 05/15/16 0507  TROPONINI 0.20* 1.08*   BNP (last 3 results) No results for input(s): PROBNP in the last 8760 hours. HbA1C: No results for input(s): HGBA1C in the last 72 hours. CBG: No results for input(s): GLUCAP in the last 168 hours. Lipid Profile: No results for input(s): CHOL, HDL, LDLCALC, TRIG, CHOLHDL, LDLDIRECT in the last 72 hours. Thyroid Function Tests: No results for input(s): TSH, T4TOTAL, FREET4, T3FREE, THYROIDAB in the last 72 hours. Anemia Panel: No results for input(s): VITAMINB12, FOLATE, FERRITIN, TIBC, IRON, RETICCTPCT in the last 72 hours. Urine analysis:    Component Value Date/Time   COLORURINE YELLOW 05/14/2016 2011   APPEARANCEUR HAZY* 05/14/2016 2011   LABSPEC 1.020 05/14/2016 2011   PHURINE 5.5 05/14/2016 2011   GLUCOSEU NEGATIVE 05/14/2016 2011   HGBUR NEGATIVE 05/14/2016 2011   [place] 05/14/2016 2011   KETONESUR NEGATIVE 05/14/2016 2011   PROTEINUR 30* 05/14/2016 2011   UROBILINOGEN 0.2 02/11/2010 1450   NITRITE NEGATIVE 05/14/2016 2011   LEUKOCYTESUR NEGATIVE 05/14/2016 2011   Sepsis  Labs: @LABRCNTIP (procalcitonin:4,lacticidven:4)  ) Recent Results (from the past 240 hour(s))  Blood Culture (routine x 2)     Status: None (Preliminary result)   Collection Time: 05/14/16  8:13 PM  Result Value Ref Range Status   Specimen Description BLOOD LEFT ARM  Final   Special Requests BOTTLES DRAWN AEROBIC AND ANAEROBIC 4CC EACH  Final   Culture NO GROWTH < 12 HOURS  Final   Report Status PENDING  Incomplete  Blood Culture (routine x 2)     Status: None (Preliminary result)   Collection Time: 05/14/16  8:30 PM  Result Value Ref Range Status   Specimen Description RIGHT ANTECUBITAL  Final   Special Requests BOTTLES DRAWN AEROBIC AND ANAEROBIC 8CC EACH  Final   Culture NO GROWTH < 12 HOURS  Final   Report Status PENDING  Incomplete  MRSA PCR Screening     Status: Abnormal   Collection Time: 05/14/16 11:30 PM  Result Value Ref Range Status   MRSA by PCR POSITIVE (A) NEGATIVE Final  Comment:        The GeneXpert MRSA Assay (FDA approved for NASAL specimens only), is one component of a comprehensive MRSA colonization surveillance program. It is not intended to diagnose MRSA infection nor to guide or monitor treatment for MRSA infections. RESULT CALLED TO, READ BACK BY AND VERIFIED WITH:  PHILLIPS,C @ [id number] ON 05/15/16 BY [place]          Radiology Studies: Dg Chest Portable 1 View  05/14/2016  CLINICAL DATA:  Low blood pressure and tachycardia. EXAM: PORTABLE CHEST 1 VIEW COMPARISON:  01/27/2016 FINDINGS: 2012 hours. Low volume film. Interstitial markings are diffusely coarsened with chronic features. No overt airspace pulmonary edema or focal lung consolidation. No pleural effusion. Telemetry leads overlie the chest. IMPRESSION: Stable exam.  Low volumes with chronic interstitial disease. Electronically Signed   By: Misty Stanley M.D.   On: 05/14/2016 20:35        Scheduled Meds: . antiseptic oral rinse  7 mL Mouth Rinse BID  . aspirin EC  81 mg Oral Daily  .  Chlorhexidine Gluconate Cloth  6 each Topical Q0600  . clopidogrel  75 mg Oral Daily  . guaiFENesin  600 mg Oral BID  . heparin  5,000 Units Subcutaneous Q8H  . multivitamin with minerals   Oral Daily  . mupirocin ointment  1 application Nasal BID  . pantoprazole  40 mg Oral QAC breakfast  . piperacillin-tazobactam (ZOSYN)  IV  3.375 g Intravenous Q8H  . sertraline  25 mg Oral Daily  . sodium chloride flush  3 mL Intravenous Q12H  . vancomycin  125 mg Oral Q6H  . vancomycin  500 mg Intravenous Q24H   Continuous Infusions:    LOS: 1 day    Time spent: 35 minutes. Greater than 50% of this time was spent in direct contact with the patient coordinating care.     Lelon Frohlich, MD [facility] Pager [phone]  If 7PM-7AM, please contact night-coverage [email or web address] Password [email or web address] 05/15/2016, 11:08 AM

## 2016-05-15 NOTE — Progress Notes (Signed)
Pharmacy Antibiotic Note  Elizabeth Oneal is a 82 y.o. female admitted on 05/14/2016 with sepsis.  Pharmacy has been consulted for Vancomycin and Zosyn dosing.  Plan: Vancomycin 500mg  IV every 24 hours.  Goal trough 15-20 mcg/mL. Zosyn 3.375g IV q8h (4 hour infusion).  Height: 5\' 5"  (165.1 cm) Weight: 123 lb 10.9 oz (56.1 kg) IBW/kg (Calculated) : 57  Temp (24hrs), Avg:97.6 F (36.4 C), Min:96.9 F (36.1 C), Max:98 F (36.7 C)   Recent Labs Lab 05/14/16 2015 05/14/16 2026 05/14/16 2243 05/15/16 0206 05/15/16 0507  WBC 22.2*  --   --   --   --   CREATININE 1.57*  --   --   --  1.23*  LATICACIDVEN  --  3.41* 1.3 1.1  --     Estimated Creatinine Clearance: 22.6 mL/min (by C-G formula based on Cr of 1.23).    Allergies  Allergen Reactions  . Celebrex [Celecoxib]   . Codeine Other (See Comments)    Goes out of right state of mind.   . Lipitor [Atorvastatin]   . Morphine And Related   . Prozac [Fluoxetine Hcl]   . Vioxx [Rofecoxib]   . Xanax [Alprazolam] Other (See Comments)    Goes out of right state of mind.     Antimicrobials this admission: Vancomycin 7/1 >>  Zosyn 7/1 >>   Dose adjustments this admission:   Microbiology results: 7/1 BCx: pending 7/1 UCx: pending 71 MRSA PCR: positive  Thank you for allowing pharmacy to be a part of this patient's care.  Isac Sarna, BS Pharm D, [place] Clinical Pharmacist Pager [phone] 05/15/2016 8:44 AM

## 2016-05-16 DIAGNOSIS — Z66 Do not resuscitate: Secondary | ICD-10-CM

## 2016-05-16 DIAGNOSIS — A419 Sepsis, unspecified organism: Secondary | ICD-10-CM | POA: Insufficient documentation

## 2016-05-16 DIAGNOSIS — Z515 Encounter for palliative care: Secondary | ICD-10-CM

## 2016-05-16 DIAGNOSIS — Z7189 Other specified counseling: Secondary | ICD-10-CM | POA: Insufficient documentation

## 2016-05-16 DIAGNOSIS — N390 Urinary tract infection, site not specified: Secondary | ICD-10-CM

## 2016-05-16 LAB — BLOOD CULTURE ID PANEL (REFLEXED)
Acinetobacter baumannii: NOT DETECTED
CANDIDA ALBICANS: NOT DETECTED
CANDIDA GLABRATA: NOT DETECTED
CANDIDA PARAPSILOSIS: NOT DETECTED
CANDIDA TROPICALIS: NOT DETECTED
Candida krusei: NOT DETECTED
Carbapenem resistance: NOT DETECTED
ENTEROBACTER CLOACAE COMPLEX: NOT DETECTED
ENTEROCOCCUS SPECIES: NOT DETECTED
ESCHERICHIA COLI: NOT DETECTED
Enterobacteriaceae species: NOT DETECTED
Haemophilus influenzae: NOT DETECTED
KLEBSIELLA PNEUMONIAE: NOT DETECTED
Klebsiella oxytoca: NOT DETECTED
LISTERIA MONOCYTOGENES: NOT DETECTED
Methicillin resistance: NOT DETECTED
Neisseria meningitidis: NOT DETECTED
PROTEUS SPECIES: NOT DETECTED
Pseudomonas aeruginosa: NOT DETECTED
SERRATIA MARCESCENS: NOT DETECTED
STREPTOCOCCUS PNEUMONIAE: NOT DETECTED
STREPTOCOCCUS PYOGENES: NOT DETECTED
Staphylococcus aureus (BCID): NOT DETECTED
Staphylococcus species: DETECTED — AB
Streptococcus agalactiae: NOT DETECTED
Streptococcus species: NOT DETECTED
VANCOMYCIN RESISTANCE: NOT DETECTED

## 2016-05-16 LAB — MISC LABCORP TEST (SEND OUT)
LABCORP TEST CODE: 183480
LABCORP TEST NAME: 183480

## 2016-05-16 MED ORDER — CEFAZOLIN SODIUM-DEXTROSE 2-4 GM/100ML-% IV SOLN
2.0000 g | Freq: Two times a day (BID) | INTRAVENOUS | Status: DC
  Filled 2016-05-16 (×3): qty 100

## 2016-05-16 MED ORDER — MORPHINE SULFATE (CONCENTRATE) 10 MG/0.5ML PO SOLN: 2.5000 mg | ORAL | Status: AC | PRN

## 2016-05-16 MED ORDER — LORAZEPAM 1 MG PO TABS: 1.0000 mg | ORAL_TABLET | ORAL | Status: AC | PRN

## 2016-05-16 MED ORDER — LORAZEPAM 2 MG/ML IJ SOLN
1.0000 mg | INTRAMUSCULAR | Status: DC | PRN
Start: 2016-05-16 — End: 2016-05-16

## 2016-05-16 MED ORDER — MORPHINE SULFATE (CONCENTRATE) 10 MG/0.5ML PO SOLN
2.5000 mg | ORAL | Status: DC | PRN
  Filled 2016-05-16: qty 0.5

## 2016-05-16 NOTE — Progress Notes (Signed)
CC'D TO PCP °

## 2016-05-16 NOTE — Care Management Note (Signed)
Case Management Note  Patient Details  Name: Elizabeth Oneal MRN: [id number] Date of Birth: 07/08/1916  Subjective/Objective:                  Pt admitted with sepsis. Pt is from [place]. Pt will [facility] to [facility], CSW has made arrangements for facility placement.   Action/Plan: No CM needs.   Expected Discharge Date:  05/17/16               Expected Discharge Plan:  [place]  In-House Referral:  Clinical Social Work  Discharge planning Services  CM Consult  Post Acute Care Choice:  NA Choice offered to:  NA  DME Arranged:    DME Agency:     HH Arranged:    [place] Agency:     Status of Service:  Completed, signed off  If discussed at [employer], dates discussed:    Additional Comments:  Sherald Barge, RN 05/16/2016, 1:35 PM

## 2016-05-16 NOTE — Progress Notes (Signed)
Patient discharged to [facility], via EMS.  Called report to [facility] nurse. IV removed and site intact. Patient sent with prescriptions and belongings.

## 2016-05-16 NOTE — Progress Notes (Signed)
Pharmacy Antibiotic Note  Elizabeth Oneal is a 82 y.o. female admitted on 05/14/2016 with sepsis >> now with (+) blood cultures, bacteremia  Pharmacy has been consulted for ANCEF dosing.  Plan: Ancef 2gm IV q12hrs (renally adjusted) Monitor labs, progress, renal fxn, c/s F/U recommendations from ID service  Height: 5\' 5"  (165.1 cm) Weight: 123 lb 7.3 oz (56 kg) IBW/kg (Calculated) : 57  Temp (24hrs), Avg:97.5 F (36.4 C), Min:96.8 F (36 C), Max:98.2 F (36.8 C)   Recent Labs Lab 05/14/16 2015 05/14/16 2026 05/14/16 2243 05/15/16 0206 05/15/16 0507  WBC 22.2*  --   --   --   --   CREATININE 1.57*  --   --   --  1.23*  LATICACIDVEN  --  3.41* 1.3 1.1  --     Estimated Creatinine Clearance: 22.6 mL/min (by C-G formula based on Cr of 1.23).    Allergies  Allergen Reactions  . Celebrex [Celecoxib]   . Codeine Other (See Comments)    Goes out of right state of mind.   . Lipitor [Atorvastatin]   . Morphine And Related   . Prozac [Fluoxetine Hcl]   . Vioxx [Rofecoxib]   . Xanax [Alprazolam] Other (See Comments)    Goes out of right state of mind.    Antimicrobials this admission: Vancomycin 7/1 >> 7/2 Zosyn 7/1 >> 7/3 Ancef 7/3 >>  Recent Results (from the past 240 hour(s))  Blood Culture (routine x 2)     Status: None (Preliminary result)   Collection Time: 05/14/16  8:13 PM  Result Value Ref Range Status   Specimen Description BLOOD LEFT ARM  Final   Special Requests BOTTLES DRAWN AEROBIC AND ANAEROBIC 4CC EACH  Final   Culture NO GROWTH < 12 HOURS  Final   Report Status PENDING  Incomplete  Blood Culture (routine x 2)     Status: None (Preliminary result)   Collection Time: 05/14/16  8:30 PM  Result Value Ref Range Status   Specimen Description RIGHT ANTECUBITAL  Final   Special Requests BOTTLES DRAWN AEROBIC AND ANAEROBIC 8CC EACH  Final   Culture  Setup Time   Final    GRAM POSITIVE COCCI Gram Stain Report Called to,Read Back By and Verified With: PHILLIPS,C AT  1838  ON 05/15/2016 BY AGUNDIZ,E. AEROBIC BOTTLE ONLY    Culture GRAM POSITIVE COCCI  Final   Report Status PENDING  Incomplete  Blood Culture ID Panel (Reflexed)     Status: Abnormal   Collection Time: 05/14/16  8:30 PM  Result Value Ref Range Status   Enterococcus species NOT DETECTED NOT DETECTED Final   Vancomycin resistance NOT DETECTED NOT DETECTED Final   Listeria monocytogenes NOT DETECTED NOT DETECTED Final   Staphylococcus species DETECTED (A) NOT DETECTED Final    Comment: CALLED JAMES DANIELS RN [id number] 7.3.17 MCADOO,G   Staphylococcus aureus NOT DETECTED NOT DETECTED Final   Methicillin resistance NOT DETECTED NOT DETECTED Final   Streptococcus species NOT DETECTED NOT DETECTED Final   Streptococcus agalactiae NOT DETECTED NOT DETECTED Final   Streptococcus pneumoniae NOT DETECTED NOT DETECTED Final   Streptococcus pyogenes NOT DETECTED NOT DETECTED Final   Acinetobacter baumannii NOT DETECTED NOT DETECTED Final   Enterobacteriaceae species NOT DETECTED NOT DETECTED Final   Enterobacter cloacae complex NOT DETECTED NOT DETECTED Final   Escherichia coli NOT DETECTED NOT DETECTED Final   Klebsiella oxytoca NOT DETECTED NOT DETECTED Final   Klebsiella pneumoniae NOT DETECTED NOT DETECTED Final   Proteus  species NOT DETECTED NOT DETECTED Final   Serratia marcescens NOT DETECTED NOT DETECTED Final   Carbapenem resistance NOT DETECTED NOT DETECTED Final   Haemophilus influenzae NOT DETECTED NOT DETECTED Final   Neisseria meningitidis NOT DETECTED NOT DETECTED Final   Pseudomonas aeruginosa NOT DETECTED NOT DETECTED Final   Candida albicans NOT DETECTED NOT DETECTED Final   Candida glabrata NOT DETECTED NOT DETECTED Final   Candida krusei NOT DETECTED NOT DETECTED Final   Candida parapsilosis NOT DETECTED NOT DETECTED Final   Candida tropicalis NOT DETECTED NOT DETECTED Final    Comment: Performed at [facility]  MRSA PCR Screening     Status: Abnormal   Collection  Time: 05/14/16 11:30 PM  Result Value Ref Range Status   MRSA by PCR POSITIVE (A) NEGATIVE Final    Comment:        The GeneXpert MRSA Assay (FDA approved for NASAL specimens only), is one component of a comprehensive MRSA colonization surveillance program. It is not intended to diagnose MRSA infection nor to guide or monitor treatment for MRSA infections. RESULT CALLED TO, READ BACK BY AND VERIFIED WITH:  PHILLIPS,C @ [id number] ON 05/15/16 BY [place]    Thank you for allowing pharmacy to be a part of this patient's care.  [place]oks Robinsons, PharmD Clinical Pharmacist Pager:  [phone] 05/16/2016 9:59 AM   05/16/2016 9:57 AM

## 2016-05-16 NOTE — Care Management Important Message (Signed)
Important Message  Patient Details  Name: Elizabeth Oneal MRN: [id number] Date of Birth: 01-29-1916   Medicare Important Message Given:  Yes    Sherald Barge, RN 05/16/2016, 1:30 PM

## 2016-05-16 NOTE — Progress Notes (Signed)
Nutrition Brief Note  Chart reviewed. Pt is from [place]. POA has chosen to proceed with comfort as goal. The plans to be discharged to [facility].  No further nutrition interventions warranted at this time.  Please re-consult as needed.    Colman Cater MS,RD,CSG,LDN Office: [phone] Pager: [phone]

## 2016-05-16 NOTE — Clinical Social Work Note (Deleted)
Patient Information    Patient Name Sex Elizabeth Oneal   Elizabeth Oneal, [place] ([id number]) Female [birth year]     Room Bed                                  SSN   [id number]                   SSN-[id number]    Patient Demographics    Address Phone   [place] [address] [phone] (Home)    Patient Ethnicity & Race    Ethnic Group Patient Race   Not Hispanic or Latino White or Caucasian    Emergency Contact(s)    Name Relation Home Work [place] Son [phone]  [phone]   Elizabeth Oneal, Elizabeth Oneal [phone]  [phone]   Elizabeth Oneal, Elizabeth Oneal [phone]  [phone]   Elizabeth Oneal, Elizabeth Oneal [phone]  [phone]    Documents on File      Status Date Received Description   Documents for the Patient   EMR Medication Summary Not Received     EMR Problem Summary Not Received     EMR Patient Summary Not Received     [place] Received 03/26/12    [place] E-Signature HIPAA Notice of Privacy Received [id number]    Driver's License Not Received     Insurance Card Received 03/26/12    Advance Directives/Living Will/HCPOA/POA Not Received     Insurance Card Not Received     [place] HIPAA NOTICE OF PRIVACY - Scanned Not Received     [place] E-Signature HIPAA Notice of Privacy [place] Received 02/26/13    Advanced Beneficiary Notice (ABN) Not Received     E-Signature AOB [place] Not Received     Other Photo ID Not Received     AMB Provider Completed Forms  03/19/15 FL2   HIM ROI Authorization  02/18/16    AMB HH/NH/Hospice  02/24/16 SPEECH THERAPY/POC AVANTE AT [place]   HIM ROI Authorization  04/04/16    Insurance Card      Documents for the Encounter   AOB (Assignment of Insurance Benefits) Not Received     E-signature AOB Received 05/14/16    MEDICARE RIGHTS Not Received     E-signature Medicare Rights Received 05/14/16    Cardiac Monitoring Strip  05/15/16      Admission Information    Attending Provider Admitting Provider Admission Type Admission Date/Time   Estela Leonie Green, MD Vianne Bulls, MD Emergency 05/14/16 1949   Discharge Date Hospital Service Auth/Cert Status Service Area    Internal Medicine Incomplete [place]   Unit Room/Bed Admission Status   AP-ICCUP NURSING IC09/IC09-01 Admission (Confirmed)         Admission    [place] [facility] Account    Name Acct ID Class Status Primary Coverage   Elizabeth Oneal, Elizabeth Oneal [id number] Inpatient Open MEDICARE - MEDICARE PART A AND B        Guarantor Account (for Hospital Account [id number])    Name Relation to Pt Service Area Active? Acct Type   Elizabeth Oneal Self CHSA Yes Personal/Family   Address Phone       [address] [place], [place] [address] [phone])          Coverage Information (for Hospital Account [id number])    1. MEDICARE/MEDICARE PART A AND B  F/O Payor/Plan Precert #   MEDICARE/MEDICARE PART A AND B    Subscriber Subscriber #   Elizabeth Oneal, Elizabeth Oneal [id number]   Address Phone   [address] [place], [place] [address]        2. MEDICAID [place]/MEDICAID OF [place]    F/O Payor/Plan Precert #   MEDICAID [place]/MEDICAID OF [place]    Subscriber Subscriber #   Elizabeth Oneal, Elizabeth Oneal [id number]   Address Phone   [address] [place], [place] [address] [phone]

## 2016-05-16 NOTE — Progress Notes (Signed)
Patient's son and grandson at bedside. Grandson at bedside states he spoke with Dr. Jerilee Hoh and he wants patient to be comfort care.

## 2016-05-16 NOTE — Consult Note (Signed)
Consultation Note Date: 05/16/2016   Patient Name: Elizabeth Oneal  DOB: 06-21-1916  MRN: [id number]  Age / Sex: 82 y.o., female  PCP: Drue Novel, NP Referring Physician: [name]*  Reason for Consultation: Disposition, Establishing goals of care, Hospice Evaluation and Psychosocial/spiritual support  HPI/Patient Profile: 82 y.o. female  with past medical history of hypertension, hyperlipidemia, see KD stage III, C diff colitis, recent non-stem MI admitted on 05/14/2016 with bacteremia.   Clinical Assessment and Goals of Care: Elizabeth Oneal is resting quietly in bed, with her family at bedside. She is able to make eye contact with me and nodded yes or no to questions. I talk with her family, [name] grandson, and [name], son outside the room. They have elected comfort care only at this time. We talk about the concepts of allow a natural death. We discuss returning to her skilled nursing home, [facility], or going to the hospice home of [facility].  We talk about symptom management, which talk about comfort and dignity.  Family expresses a desire that there loved one people kept comfortable and pain free. They are accepting that hospice will not provide IV fluids or curative treatments, but she will receive food and drink if she wants/is able, and medications for pain and anxiety. Family elects for Elizabeth Oneal to be transferred to the hospice home of [facility].  HCPOA, adult sons [name] and [name]   SUMMARY OF RECOMMENDATIONS   full comfort care with hospice of [facility]  Code Status/Advance Care Planning:  DNR  Symptom Management:   per hospitalist, per hospice protocol  Palliative Prophylaxis:   Bowel Regimen, Frequent Pain Assessment, Oral Care and Turn Reposition  Additional Recommendations (Limitations, Scope, Preferences):  Full Comfort  Care  Psycho-social/Spiritual:   Desire for further Chaplaincy support:no  Additional Recommendations: Caregiving  Support/Resources  Prognosis:   < 2 weeks, likely based on bacteremia, and families desire to focus on comfort only, allowing natural death.  Discharge Planning: [facility].      Primary Diagnoses: Present on Admission:  . Acute respiratory failure with hypoxia ([place]) . Hyponatremia . Normocytic anemia . Thrombocytosis ([place]) . Chronic systolic CHF (congestive heart failure) ([place]) . Diarrhea . Septic shock ([place]) . CKD (chronic kidney disease) stage 3, GFR 30-59 ml/min . AKI (acute kidney injury) ([place]) . CAD (coronary artery disease)  I have reviewed the medical record, interviewed the patient and family, and examined the patient. The following aspects are pertinent.  Past Medical History  Diagnosis Date  . Hyperlipidemia   . Essential hypertension   . Degenerative disc disease, lumbar   . C. difficile colitis   . Reflux   . Hiatal hernia   . History of pneumonia    Social History   Social History  . Marital Status: Widowed    Spouse Name: N/A  . Number of Children: N/A  . Years of Education: N/A   Social History Main Topics  . Smoking status: Never Smoker   .  Smokeless tobacco: None  . Alcohol Use: No  . Drug Use: No  . Sexual Activity: Not Asked   Other Topics Concern  . None   Social History Narrative   Family History  Problem Relation Age of Onset  . Heart disease Sister    Scheduled Meds: . antiseptic oral rinse  7 mL Mouth Rinse BID  . Chlorhexidine Gluconate Cloth  6 each Topical Q0600  . mupirocin ointment  1 application Nasal BID   Continuous Infusions:  PRN Meds:.LORazepam, morphine CONCENTRATE Medications Prior to Admission:  Prior to Admission medications   Medication Sig Start Date End Date Taking? Authorizing Provider  acetaminophen (TYLENOL) 500 MG tablet Take 500 mg by mouth every 6 (six) hours as  needed. Takes 2 tablets daily   Yes Historical Provider, MD  Amino Acids (AMINO ACID PO) Take 40 mg by mouth daily as needed.   Yes Historical Provider, MD  aspirin EC 81 MG EC tablet Take 1 tablet (81 mg total) by mouth daily. 01/01/16  Yes Samuella Cota, MD  clopidogrel (PLAVIX) 75 MG tablet Take 1 tablet (75 mg total) by mouth daily. 01/01/16  Yes Samuella Cota, MD  furosemide (LASIX) 20 MG tablet Take 1 tablet (20 mg total) by mouth daily. 01/01/16  Yes Samuella Cota, MD  guaifenesin (HUMIBID E) 400 MG TABS tablet Take 400 mg by mouth 2 (two) times daily.   Yes Historical Provider, MD  metoprolol tartrate (LOPRESSOR) 25 MG tablet Take 0.5 tablets (12.5 mg total) by mouth 2 (two) times daily. 02/04/16  Yes Lendon Colonel, NP  Multiple Vitamins-Minerals (MULTIVITAMIN WITH MINERALS) tablet Take 1 tablet by mouth daily.   Yes Historical Provider, MD  nitroGLYCERIN (NITROSTAT) 0.4 MG SL tablet Place 0.4 mg under the tongue every 5 (five) minutes as needed for chest pain.   Yes Historical Provider, MD  ondansetron (ZOFRAN) 4 MG tablet Take 1 tablet (4 mg total) by mouth 4 (four) times daily -  before meals and at bedtime. 02/12/16  Yes Orvil Feil, NP  pantoprazole (PROTONIX) 40 MG tablet Take 1 tablet (40 mg total) by mouth daily. 30 minutes before breakfast 05/10/16  Yes Orvil Feil, NP  potassium chloride (KLOR-CON) 20 MEQ packet Take 20 mEq by mouth daily. Reported on 05/10/2016   Yes Historical Provider, MD  sertraline (ZOLOFT) 25 MG tablet Take 25 mg by mouth daily.  05/10/16  Yes Historical Provider, MD  LORazepam (ATIVAN) 1 MG tablet Take 1 tablet (1 mg total) by mouth every 2 (two) hours as needed for anxiety. 05/16/16   Erline Hau, MD  Morphine Sulfate (MORPHINE CONCENTRATE) 10 MG/0.5ML SOLN concentrated solution Take 0.13 mLs (2.6 mg total) by mouth every 2 (two) hours as needed for severe pain. 05/16/16   Erline Hau, MD   Allergies  Allergen Reactions  .  Celebrex [Celecoxib]   . Codeine Other (See Comments)    Goes out of right state of mind.   . Lipitor [Atorvastatin]   . Morphine And Related   . Prozac [Fluoxetine Hcl]   . Vioxx [Rofecoxib]   . Xanax [Alprazolam] Other (See Comments)    Goes out of right state of mind.    Review of Systems  Unable to perform ROS: Acuity of condition    Physical Exam  Constitutional: No distress.  HENT:  Head: Normocephalic and atraumatic.  Cardiovascular: Normal rate.   Pulmonary/Chest: Effort normal. No respiratory distress.  Abdominal: Soft. She exhibits  no distension.  Neurological: She is alert.  Skin: Skin is warm and dry.  Nursing note and vitals reviewed.   Vital Signs: BP 89/37 mmHg  Pulse 94  Temp(Src) 97.3 F (36.3 C) (Axillary)  Resp 16  Ht 5\' 5"  (1.651 m)  Wt 56 kg (123 lb 7.3 oz)  BMI 20.54 kg/m2  SpO2 84% Pain Assessment: No/denies pain POSS *See Group Information*: 1-Acceptable,Awake and alert     SpO2: SpO2: (!) 84 % O2 Device:SpO2: (!) 84 % O2 Flow Rate: .O2 Flow Rate (L/min): 2 L/min  IO: Intake/output summary:  Intake/Output Summary (Last 24 hours) at 05/16/16 1644 Last data filed at 05/16/16 0300  Gross per 24 hour  Intake 603.75 ml  Output      5 ml  Net 598.75 ml    LBM: Last BM Date: 05/15/16 Baseline Weight: Weight: 56.1 kg (123 lb 10.9 oz) Most recent weight: Weight: 56 kg (123 lb 7.3 oz)     Palliative Assessment/Data:    Time In: 0940 Time Out: 1050 Time Total: 70 minutes Greater than 50%  of this time was spent counseling and coordinating care related to the above assessment and plan.  Signed by: Drue Novel, NP   Please contact Palliative Medicine Team phone at [phone] for questions and concerns.  For individual provider: See Shea Evans

## 2016-05-16 NOTE — Clinical Social Work Note (Signed)
Clinical Social Work Assessment  Patient Details  Name: Elizabeth Oneal MRN: [id number] Date of Birth: Apr 23, 1916  Date of referral:  05/16/16               Reason for consult:  Other (Comment Required) (Hospice Home Referral)                Permission sought to share information with:    Permission granted to share information::     Name::        Agency::     Relationship::     Contact Information:     Housing/Transportation Living arrangements for the past 2 months:  [place] of Information:  Adult Children Patient Interpreter Needed:  None Criminal Activity/Legal Involvement Pertinent to Current Situation/Hospitalization:  No - Comment as needed Significant Relationships:  Adult Children, Other Family Members Lives with:  Facility Resident Do you feel safe going back to the place where you live?  Yes Need for family participation in patient care:  Yes (Comment)  Care giving concerns:  None identified.    Social Worker assessment / plan:  Patient's family met with Quinn Axe, NP, Palliative Care, advised that she had met with the family and they had made the decision for patient to go to [facility].  CSW met with patient's sons [name] and Ramsie Ostrander, who were agreeable to CSW sending clinicals to [facility]. CSW contacted Jenny Reichmann at [facility] and discussed referral. CSW sent paperwork to [facility].  CSW spoke with Jenny Reichmann who indicated that the facility could accept patient. CSW paged attending advising that patient had been accepted at [facility] and could come today.   Employment status:  Retired Forensic scientist:  Medicare PT Recommendations:  Not assessed at this time Information / Referral to community resources:     Patient/Family's Response to care:  Family is agreeable to [facility] placement.   Patient/Family's Understanding of and Emotional Response to Diagnosis, Current Treatment, and Prognosis:  Patient's  family agrees that patient can be best served with Hospice Home due to patient's diagnosis, treatment and prognosis.    Emotional Assessment Appearance:  Appears stated age Attitude/Demeanor/Rapport:  Unable to Assess Affect (typically observed):  Unable to Assess Orientation:    Alcohol / Substance use:  Not Applicable Psych involvement (Current and /or in the community):  No (Comment)  Discharge Needs  Concerns to be addressed:   [facility] Referral) Readmission within the last 30 days:  No Current discharge risk:  None Barriers to Discharge:  No Barriers Identified   Ihor Gully, LCSW 05/16/2016, 1:47 PM

## 2016-05-16 NOTE — Progress Notes (Signed)
PROGRESS NOTE    Elizabeth Oneal  [id number] DOB: September 30, 1916 DOA: 05/14/2016 PCP: Drue Novel, NP     Brief Narrative:  82 y/o woman admitted from SNF on 7/1 after found to be hypotensive and tachycardic. She has had persistent diarrhea for the past year despite adequate treatment for C. difficile colitis. Upon arrival she was found to have a blood pressure of 55/31, tachycardic to 122 and tachypneic to 34. After discussions with family and they agree to fluids and antibiotics though no one her care escalated beyond that. Her troponin has increased to 1.08. Discussed with grandson on 7/3. Decision made to transition over to comfort care. Palliative care consultation is pending.   Assessment & Plan:   Principal Problem:   Septic shock ([place]) Active Problems:   Diarrhea   CKD (chronic kidney disease) stage 3, GFR 30-59 ml/min   Acute respiratory failure with hypoxia (HCC)   AKI (acute kidney injury) ([place])   Hyponatremia   Normocytic anemia   Thrombocytosis (H[facility])   Chronic systolic CHF (congestive heart failure) (HCC)   CAD (coronary artery disease)   Demand ischemia (HCC)   Septic/hypovolemic shock -Cultures with gram-positive cocci, patient's family has elected to pursue comfort measures hence antibiotics will be discontinued.  Demand ischemia -Troponin has risen to 1.08, does not have acute ischemic abnormalities on EKG and denies any chest pain.  -2-D echo from February 2017 shows an ejection fraction of 30-35% with multiple wall motion abnormalities but not technically sufficient to evaluate LV diastolic function. -Family has elected to pursue comfort care, no further workup.  Acute on chronic kidney disease stage III Hyponatremia Chronic systolic CHF    DVT prophylaxis: Subcutaneous heparin Code Status: DO NOT RESUSCITATE Family Communication: Grandson via phone Disposition Plan: transfer to floor, palliative care consultation, need to determine whether residential  hospice or home with hospice.  Consultants:   None  Procedures:   None  Antimicrobials:   None   Subjective: Lying in bed, complains of just feeling tired and weak, no chest pain or shortness of breath, has had 1 episode of diarrhea since this morning per RN.  Objective: Filed Vitals:   05/16/16 0600 05/16/16 0800 05/16/16 0815 05/16/16 0900  BP: 68/58 64/32 92/41  74/36  Pulse:    95  Temp:  97.3 F (36.3 C)    TempSrc:  Axillary    Resp: 14 15 21 14   Height:      Weight:      SpO2:    100%    Intake/Output Summary (Last 24 hours) at 05/16/16 1012 Last data filed at 05/16/16 0300  Gross per 24 hour  Intake 653.75 ml  Output      7 ml  Net 646.75 ml   Filed Weights   05/14/16 2315 05/15/16 0400 05/16/16 0400  Weight: 56.1 kg (123 lb 10.9 oz) 56.1 kg (123 lb 10.9 oz) 56 kg (123 lb 7.3 oz)    Examination:  General exam: awake but drowsy Respiratory system: bibasilar crackles Cardiovascular system:Tachycardic, regular rhythm No murmurs, rubs, gallops. Gastrointestinal system: Abdomen is nondistended, soft and nontender. No organomegaly or masses felt. Normal bowel sounds heard. Central nervous system: Drowsy, unable to fully assess. Extremities: No C/C/E, +pedal pulses Skin: No rashes, lesions or ulcers Psychiatry: Unable to assess    Data Reviewed: I have personally reviewed following labs and imaging studies  CBC:  Recent Labs Lab 05/14/16 2015  WBC 22.2*  NEUTROABS 20.7*  HGB 10.6*  HCT 31.3*  MCV  84.4  PLT [id number]*   Basic Metabolic Panel:  Recent Labs Lab 05/14/16 2015 05/15/16 0507  NA 129* 134*  K 4.3 3.9  CL 96* 108  CO2 23 22  GLUCOSE 124* 113*  BUN 31* 29*  CREATININE 1.57* 1.23*  CALCIUM 7.9* 7.0*   GFR: Estimated Creatinine Clearance: 22.6 mL/min (by C-G formula based on Cr of 1.23). Liver Function Tests:  Recent Labs Lab 05/14/16 2015 05/15/16 0507  AST 28 31  ALT 16 15  ALKPHOS 111 82  BILITOT 0.7 0.7  PROT 4.9*  3.7*  ALBUMIN 2.0* 1.5*   No results for input(s): LIPASE, AMYLASE in the last 168 hours. No results for input(s): AMMONIA in the last 168 hours. Coagulation Profile:  Recent Labs Lab 05/14/16 2243  INR 1.48   Cardiac Enzymes:  Recent Labs Lab 05/14/16 2243 05/15/16 0507 05/15/16 1129  TROPONINI 0.20* 1.08* 1.00*   BNP (last 3 results) No results for input(s): PROBNP in the last 8760 hours. HbA1C: No results for input(s): HGBA1C in the last 72 hours. CBG: No results for input(s): GLUCAP in the last 168 hours. Lipid Profile: No results for input(s): CHOL, HDL, LDLCALC, TRIG, CHOLHDL, LDLDIRECT in the last 72 hours. Thyroid Function Tests: No results for input(s): TSH, T4TOTAL, FREET4, T3FREE, THYROIDAB in the last 72 hours. Anemia Panel: No results for input(s): VITAMINB12, FOLATE, FERRITIN, TIBC, IRON, RETICCTPCT in the last 72 hours. Urine analysis:    Component Value Date/Time   COLORURINE YELLOW 05/14/2016 2011   APPEARANCEUR HAZY* 05/14/2016 2011   LABSPEC 1.020 05/14/2016 2011   PHURINE 5.5 05/14/2016 2011   GLUCOSEU NEGATIVE 05/14/2016 2011   HGBUR NEGATIVE 05/14/2016 2011   BILIRUBINUR NEGATIVE 05/14/2016 2011   KETONESUR NEGATIVE 05/14/2016 2011   PROTEINUR 30* 05/14/2016 2011   UROBILINOGEN 0.2 02/11/2010 1450   NITRITE NEGATIVE 05/14/2016 2011   LEUKOCYTESUR NEGATIVE 05/14/2016 2011   Sepsis Labs: @LABRCNTIP (procalcitonin:4,lacticidven:4)  ) Recent Results (from the past 240 hour(s))  Blood Culture (routine x 2)     Status: None (Preliminary result)   Collection Time: 05/14/16  8:13 PM  Result Value Ref Range Status   Specimen Description BLOOD LEFT ARM  Final   Special Requests BOTTLES DRAWN AEROBIC AND ANAEROBIC 4CC EACH  Final   Culture NO GROWTH < 12 HOURS  Final   Report Status PENDING  Incomplete  Blood Culture (routine x 2)     Status: None (Preliminary result)   Collection Time: 05/14/16  8:30 PM  Result Value Ref Range Status    Specimen Description RIGHT ANTECUBITAL  Final   Special Requests BOTTLES DRAWN AEROBIC AND ANAEROBIC 8CC EACH  Final   Culture  Setup Time   Final    GRAM POSITIVE COCCI Gram Stain Report Called to,Read Back By and Verified With: PHILLIPS,C AT 1838  ON 05/15/2016 BY AGUNDIZ,E. AEROBIC BOTTLE ONLY    Culture GRAM POSITIVE COCCI  Final   Report Status PENDING  Incomplete  Blood Culture ID Panel (Reflexed)     Status: Abnormal   Collection Time: 05/14/16  8:30 PM  Result Value Ref Range Status   Enterococcus species NOT DETECTED NOT DETECTED Final   Vancomycin resistance NOT DETECTED NOT DETECTED Final   Listeria monocytogenes NOT DETECTED NOT DETECTED Final   Staphylococcus species DETECTED (A) NOT DETECTED Final    Comment: CALLED JAMES DANIELS RN [id number] 7.3.17 MCADOO,G   Staphylococcus aureus NOT DETECTED NOT DETECTED Final   Methicillin resistance NOT DETECTED NOT DETECTED Final   Streptococcus  species NOT DETECTED NOT DETECTED Final   Streptococcus agalactiae NOT DETECTED NOT DETECTED Final   Streptococcus pneumoniae NOT DETECTED NOT DETECTED Final   Streptococcus pyogenes NOT DETECTED NOT DETECTED Final   Acinetobacter baumannii NOT DETECTED NOT DETECTED Final   Enterobacteriaceae species NOT DETECTED NOT DETECTED Final   Enterobacter cloacae complex NOT DETECTED NOT DETECTED Final   Escherichia coli NOT DETECTED NOT DETECTED Final   Klebsiella oxytoca NOT DETECTED NOT DETECTED Final   Klebsiella pneumoniae NOT DETECTED NOT DETECTED Final   Proteus species NOT DETECTED NOT DETECTED Final   Serratia marcescens NOT DETECTED NOT DETECTED Final   Carbapenem resistance NOT DETECTED NOT DETECTED Final   Haemophilus influenzae NOT DETECTED NOT DETECTED Final   Neisseria meningitidis NOT DETECTED NOT DETECTED Final   Pseudomonas aeruginosa NOT DETECTED NOT DETECTED Final   Candida albicans NOT DETECTED NOT DETECTED Final   Candida glabrata NOT DETECTED NOT DETECTED Final   Candida krusei  NOT DETECTED NOT DETECTED Final   Candida parapsilosis NOT DETECTED NOT DETECTED Final   Candida tropicalis NOT DETECTED NOT DETECTED Final    Comment: Performed at [facility]  MRSA PCR Screening     Status: Abnormal   Collection Time: 05/14/16 11:30 PM  Result Value Ref Range Status   MRSA by PCR POSITIVE (A) NEGATIVE Final    Comment:        The GeneXpert MRSA Assay (FDA approved for NASAL specimens only), is one component of a comprehensive MRSA colonization surveillance program. It is not intended to diagnose MRSA infection nor to guide or monitor treatment for MRSA infections. RESULT CALLED TO, READ BACK BY AND VERIFIED WITH:  PHILLIPS,C @ [id number] ON 05/15/16 BY [facility]          Radiology Studies: Dg Chest Portable 1 View  05/14/2016  CLINICAL DATA:  Low blood pressure and tachycardia. EXAM: PORTABLE CHEST 1 VIEW COMPARISON:  01/27/2016 FINDINGS: 2012 hours. Low volume film. Interstitial markings are diffusely coarsened with chronic features. No overt airspace pulmonary edema or focal lung consolidation. No pleural effusion. Telemetry leads overlie the chest. IMPRESSION: Stable exam.  Low volumes with chronic interstitial disease. Electronically Signed   By: Misty Stanley M.D.   On: 05/14/2016 20:35        Scheduled Meds: . antiseptic oral rinse  7 mL Mouth Rinse BID  . Chlorhexidine Gluconate Cloth  6 each Topical Q0600  . mupirocin ointment  1 application Nasal BID   Continuous Infusions:    LOS: 2 days    Time spent: 35 minutes. Greater than 50% of this time was spent in direct contact with the patient coordinating care.     Lelon Frohlich, MD [facility] Pager [phone]  If 7PM-7AM, please contact night-coverage [email or web address] Password [email or web address] 05/16/2016, 10:12 AM

## 2016-05-16 NOTE — Discharge Summary (Signed)
Physician Discharge Summary  Elizabeth Oneal [id number] DOB: [birth year] DOA: 05/14/2016  PCP: Drue Novel, NP  Admit date: 05/14/2016 Discharge date: 05/16/2016  Time spent: 45 minutes  Recommendations for Outpatient Follow-up:  -Will be discharged to hospice home today for end of life care.   Discharge Diagnoses:  Principal Problem:   Septic shock ([place]) Active Problems:   Diarrhea   CKD (chronic kidney disease) stage 3, GFR 30-59 ml/min   Acute respiratory failure with hypoxia (HCC)   AKI (acute kidney injury) (HCC)   Hyponatremia   Normocytic anemia   Thrombocytosis (HCC)   Chronic systolic CHF (congestive heart failure) (HCC)   CAD (coronary artery disease)   Demand ischemia [facility])   Discharge Condition: Guarded  Filed Weights   05/14/16 2315 05/15/16 0400 05/16/16 0400  Weight: 56.1 kg (123 lb 10.9 oz) 56.1 kg (123 lb 10.9 oz) 56 kg (123 lb 7.3 oz)    History of present illness:  As per Dr. Myna Hidalgo on 7/1: [name] is a 82 y.o. female with medical history significant for hypertension, hyperlipidemia, chronic kidney disease stage III, C. difficile colitis, and recent non-STEMI who presents from her SNF for evaluation of hypotension and tachycardia. History is obtained from the patient's son and grandsons at bedside, discussion with the ED personnel, and review of the electronic medical record. She had reportedly been doing well at the SNF, though with persistent diarrhea for the past year despite treatment for C. difficile colitis. She was noted to become much more lethargic over the past day, has not been speaking, and developed hypotension and tachycardia, prompting activation of EMS for transport to the hospital. She had reportedly been refusing food for several days prior to the onset of lethargy. She has recently completed a taper of oral vancomycin. C. difficile antigen was positive, but with no detectable toxin on 04/21/2016. GI advised against further antibiotic  treatment for C. difficile at that time. There is been no documented fevers and the patient has not been complaining of pain. She has not been vomiting. Melena or hematochezia are denied.  ED Course: Upon arrival to the ED, patient is found to be afebrile, saturating 99% on room air, tachypneic to 34, tachycardic to 122, and with blood pressure 55/31. EKG demonstrates sinus tachycardia with left bundle branch block and chest x-rays negative for acute cardiopulmonary disease. CMP is notable for a sodium of 129 and serum creatinine 1.57, up from recent value of 1.15. CBC features a leukocytosis to 22,200, hemoglobin of 10.6, and platelet count of 459,000. Lactic acid is elevated to 3.41, and urinalysis features many bacteria, 6-30 WBC, negative leukocyte, and negative nitrite. Blood and urine cultures were obtained, patient was given 30 cc/kg normal saline bolus, and empiric vancomycin and Zosyn were administered. Patient's tachycardia improved with the fluid bolus but she remained hypotensive and additional fluids were ordered. It was discussed with the patient's family at bedside that she will likely not survive this illness. Plan for additional IV fluid and antibiotics was discussed with family and they confirmed that the patient would not want her care escalated beyond that. Patient appears comfortable at this time and will be admitted to the stepdown unit for ongoing evaluation and management of septic shock with source not yet identified.  Hospital Course:   Septic/hypovolemic shock -Cultures with gram-positive cocci, patient's family has elected to pursue comfort measures hence antibiotics will be discontinued.  Demand ischemia -Troponin has risen to 1.08, does not have acute ischemic  abnormalities on EKG and denies any chest pain.  -2-D echo from February 2017 shows an ejection fraction of 30-35% with multiple wall motion abnormalities but not technically sufficient to evaluate LV diastolic  function. -Family has elected to pursue comfort care, no further workup.  Acute on chronic kidney disease stage III Hyponatremia Chronic systolic CHF   Procedures:  None   Consultations:  Palliative Care  Discharge Instructions     Medication List    STOP taking these medications        acetaminophen 500 MG tablet  Commonly known as:  TYLENOL     AMINO ACID PO     aspirin 81 MG EC tablet     clopidogrel 75 MG tablet  Commonly known as:  PLAVIX     furosemide 20 MG tablet  Commonly known as:  LASIX     guaifenesin 400 MG Tabs tablet  Commonly known as:  HUMIBID E     metoprolol tartrate 25 MG tablet  Commonly known as:  LOPRESSOR     multivitamin with minerals tablet     nitroGLYCERIN 0.4 MG SL tablet  Commonly known as:  NITROSTAT     ondansetron 4 MG tablet  Commonly known as:  ZOFRAN     pantoprazole 40 MG tablet  Commonly known as:  PROTONIX     potassium chloride 20 MEQ packet  Commonly known as:  KLOR-CON     sertraline 25 MG tablet  Commonly known as:  ZOLOFT      TAKE these medications        LORazepam 1 MG tablet  Commonly known as:  ATIVAN  Take 1 tablet (1 mg total) by mouth every 2 (two) hours as needed for anxiety.     morphine CONCENTRATE 10 MG/0.5ML Soln concentrated solution  Take 0.13 mLs (2.6 mg total) by mouth every 2 (two) hours as needed for severe pain.       Allergies  Allergen Reactions  . Celebrex [Celecoxib]   . Codeine Other (See Comments)    Goes out of right state of mind.   . Lipitor [Atorvastatin]   . Morphine And Related   . Prozac [Fluoxetine Hcl]   . Vioxx [Rofecoxib]   . Xanax [Alprazolam] Other (See Comments)    Goes out of right state of mind.       The results of significant diagnostics from this hospitalization (including imaging, microbiology, ancillary and laboratory) are listed below for reference.    Significant Diagnostic Studies: Dg Chest Portable 1 View  05/14/2016  CLINICAL DATA:   Low blood pressure and tachycardia. EXAM: PORTABLE CHEST 1 VIEW COMPARISON:  01/27/2016 FINDINGS: 2012 hours. Low volume film. Interstitial markings are diffusely coarsened with chronic features. No overt airspace pulmonary edema or focal lung consolidation. No pleural effusion. Telemetry leads overlie the chest. IMPRESSION: Stable exam.  Low volumes with chronic interstitial disease. Electronically Signed   By: Misty Stanley M.D.   On: 05/14/2016 20:35    Microbiology: Recent Results (from the past 240 hour(s))  Blood Culture (routine x 2)     Status: None (Preliminary result)   Collection Time: 05/14/16  8:13 PM  Result Value Ref Range Status   Specimen Description BLOOD LEFT ARM  Final   Special Requests BOTTLES DRAWN AEROBIC AND ANAEROBIC 4CC EACH  Final   Culture NO GROWTH 2 DAYS  Final   Report Status PENDING  Incomplete  Blood Culture (routine x 2)     Status: None (Preliminary result)  Collection Time: 05/14/16  8:30 PM  Result Value Ref Range Status   Specimen Description RIGHT ANTECUBITAL  Final   Special Requests BOTTLES DRAWN AEROBIC AND ANAEROBIC 8CC EACH  Final   Culture  Setup Time   Final    GRAM POSITIVE COCCI Gram Stain Report Called to,Read Back By and Verified With: PHILLIPS,C AT 1838  ON 05/15/2016 BY AGUNDIZ,E. AEROBIC BOTTLE ONLY    Culture GRAM POSITIVE COCCI  Final   Report Status PENDING  Incomplete  Blood Culture ID Panel (Reflexed)     Status: Abnormal   Collection Time: 05/14/16  8:30 PM  Result Value Ref Range Status   Enterococcus species NOT DETECTED NOT DETECTED Final   Vancomycin resistance NOT DETECTED NOT DETECTED Final   Listeria monocytogenes NOT DETECTED NOT DETECTED Final   Staphylococcus species DETECTED (A) NOT DETECTED Final    Comment: CALLED JAMES DANIELS RN [id number] 7.3.17 MCADOO,G   Staphylococcus aureus NOT DETECTED NOT DETECTED Final   Methicillin resistance NOT DETECTED NOT DETECTED Final   Streptococcus species NOT DETECTED NOT DETECTED  Final   Streptococcus agalactiae NOT DETECTED NOT DETECTED Final   Streptococcus pneumoniae NOT DETECTED NOT DETECTED Final   Streptococcus pyogenes NOT DETECTED NOT DETECTED Final   Acinetobacter baumannii NOT DETECTED NOT DETECTED Final   Enterobacteriaceae species NOT DETECTED NOT DETECTED Final   Enterobacter cloacae complex NOT DETECTED NOT DETECTED Final   Escherichia coli NOT DETECTED NOT DETECTED Final   Klebsiella oxytoca NOT DETECTED NOT DETECTED Final   Klebsiella pneumoniae NOT DETECTED NOT DETECTED Final   Proteus species NOT DETECTED NOT DETECTED Final   Serratia marcescens NOT DETECTED NOT DETECTED Final   Carbapenem resistance NOT DETECTED NOT DETECTED Final   Haemophilus influenzae NOT DETECTED NOT DETECTED Final   Neisseria meningitidis NOT DETECTED NOT DETECTED Final   Pseudomonas aeruginosa NOT DETECTED NOT DETECTED Final   Candida albicans NOT DETECTED NOT DETECTED Final   Candida glabrata NOT DETECTED NOT DETECTED Final   Candida krusei NOT DETECTED NOT DETECTED Final   Candida parapsilosis NOT DETECTED NOT DETECTED Final   Candida tropicalis NOT DETECTED NOT DETECTED Final    Comment: Performed at [facility]  MRSA PCR Screening     Status: Abnormal   Collection Time: 05/14/16 11:30 PM  Result Value Ref Range Status   MRSA by PCR POSITIVE (A) NEGATIVE Final    Comment:        The GeneXpert MRSA Assay (FDA approved for NASAL specimens only), is one component of a comprehensive MRSA colonization surveillance program. It is not intended to diagnose MRSA infection nor to guide or monitor treatment for MRSA infections. RESULT CALLED TO, READ BACK BY AND VERIFIED WITH:  PHILLIPS,C @ 0431 ON 05/15/16 BY WOODIE,J      Labs: Basic Metabolic Panel:  Recent Labs Lab 05/14/16 2015 05/15/16 0507  NA 129* 134*  K 4.3 3.9  CL 96* 108  CO2 23 22  GLUCOSE 124* 113*  BUN 31* 29*  CREATININE 1.57* 1.23*  CALCIUM 7.9* 7.0*   Liver Function  Tests:  Recent Labs Lab 05/14/16 2015 05/15/16 0507  AST 28 31  ALT 16 15  ALKPHOS 111 82  BILITOT 0.7 0.7  PROT 4.9* 3.7*  ALBUMIN 2.0* 1.5*   No results for input(s): LIPASE, AMYLASE in the last 168 hours. No results for input(s): AMMONIA in the last 168 hours. CBC:  Recent Labs Lab 05/14/16 2015  WBC 22.2*  NEUTROABS 20.7*  HGB 10.6*  HCT 31.3*  MCV 84.4  PLT 459*   Cardiac Enzymes:  Recent Labs Lab 05/14/16 2243 05/15/16 0507 05/15/16 1129  TROPONINI 0.20* 1.08* 1.00*   BNP: BNP (last 3 results) No results for input(s): BNP in the last 8760 hours.  ProBNP (last 3 results) No results for input(s): PROBNP in the last 8760 hours.  CBG: No results for input(s): GLUCAP in the last 168 hours.     SignedLelon Frohlich  [facility] Pager: [phone] 05/16/2016, 2:03 PM

## 2016-05-17 LAB — URINE CULTURE

## 2016-05-18 LAB — CULTURE, BLOOD (ROUTINE X 2)

## 2016-05-20 LAB — CULTURE, BLOOD (ROUTINE X 2): CULTURE: NO GROWTH

## 2016-06-14 DEATH — deceased

## 2016-09-16 IMAGING — CR DG CHEST 1V PORT
1 series · 1 of 1 positions shown · non-contrast
Comparison: Chest radiograph performed 12/31/2015

CLINICAL DATA: Acute onset of fever and high blood pressure. Mild
cough. Initial encounter.

EXAM:
PORTABLE CHEST 1 VIEW

[ap]
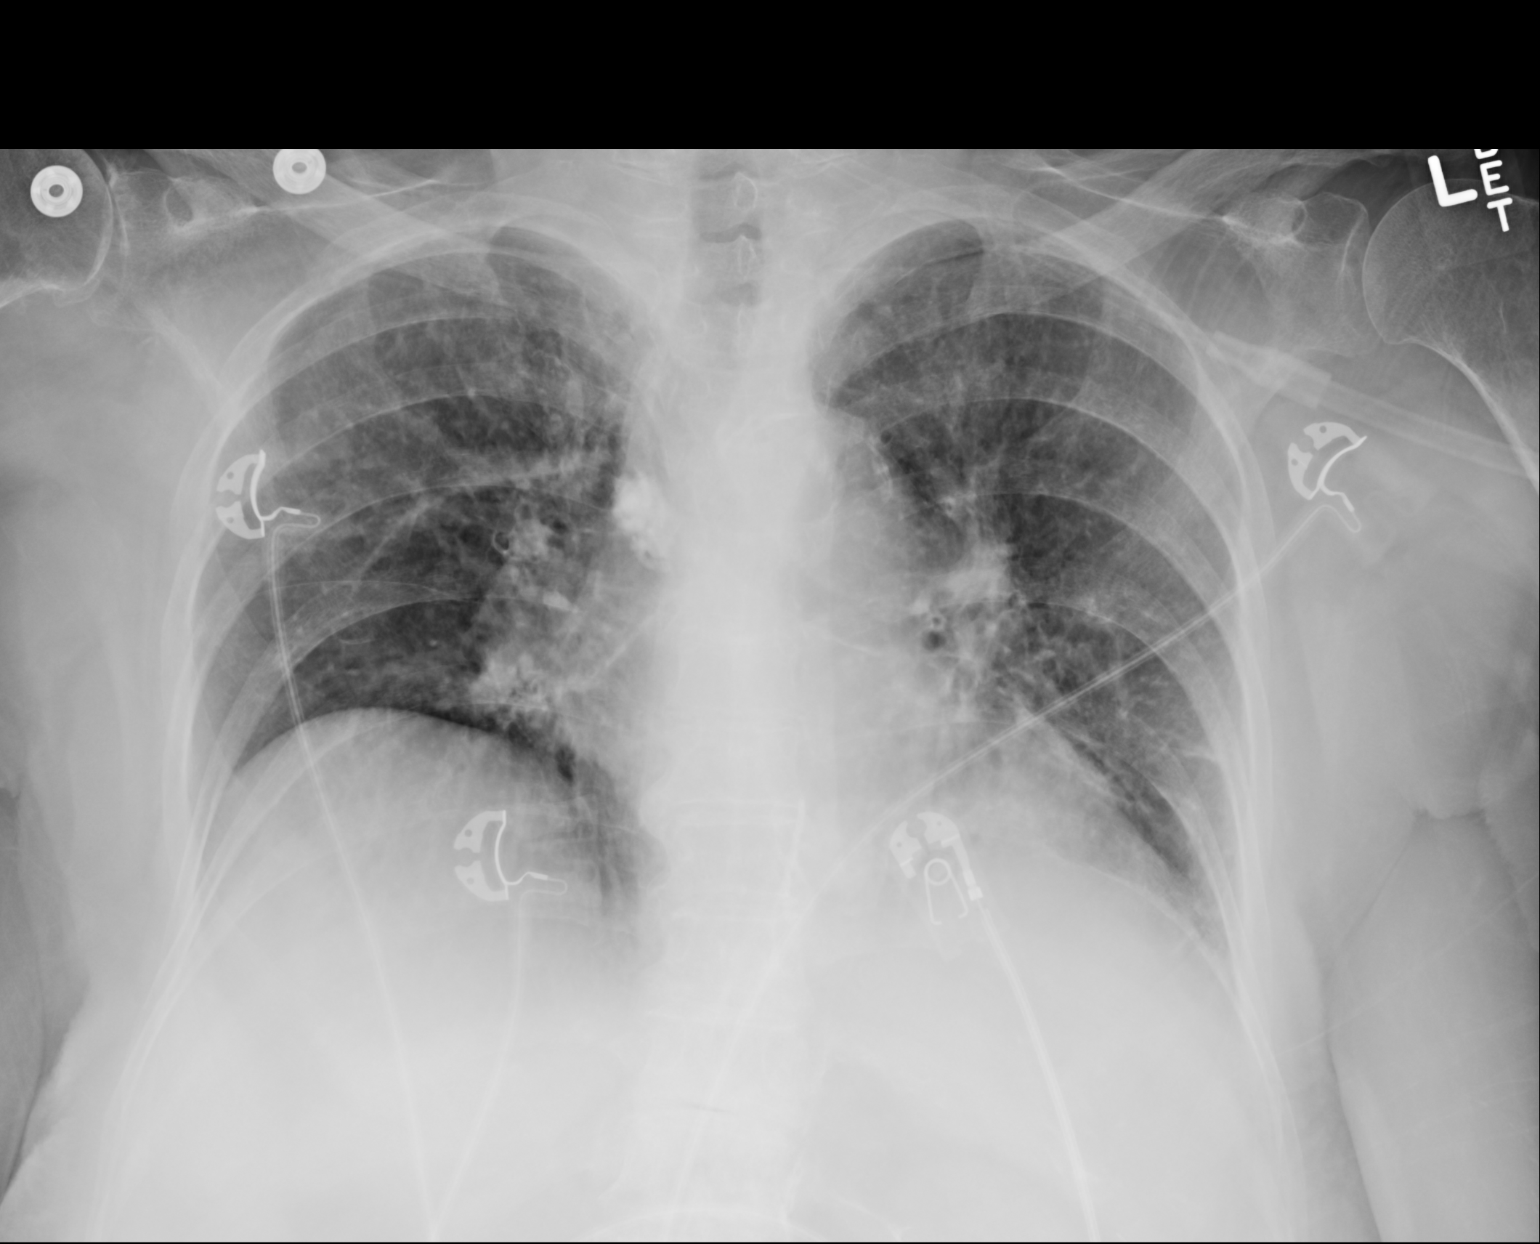

[1 of 1 positions shown; findings below may reference images not displayed]

FINDINGS: The lungs are hypoexpanded. Vascular crowding and vascular
congestion are seen. Peribronchial thickening is noted. Mild
bilateral opacities likely reflect atelectasis. No definite pleural
effusion or pneumothorax is seen.

The cardiomediastinal silhouette is normal in size. No acute osseous
abnormalities are identified.
IMPRESSION: Lungs hypoexpanded. Vascular congestion noted. Peribronchial
thickening noted. Mild bilateral opacities likely reflect
atelectasis.

## 2016-11-26 IMAGING — RF DG ESOPHAGUS
3 series · 6 of 6 positions shown · non-contrast
Comparison: No priors.

CLINICAL DATA: [AGE] female with history of dysphasia.
Proximal esophageal dysmotility. History of hiatal hernia and
Schatzki's ring.

EXAM:
ESOPHOGRAM/BARIUM SWALLOW
TECHNIQUE: Single contrast examination was performed using  thin barium.
FLUOROSCOPY TIME:  Fluoroscopy Time:  1 minutes 6 seconds

[Series 1: cp_standard · 0.55mm/px · 4 of 114 frames shown]
[frame 18/114]
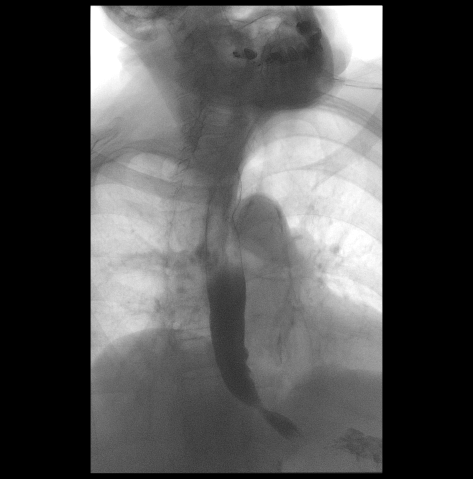
[frame 58/114]
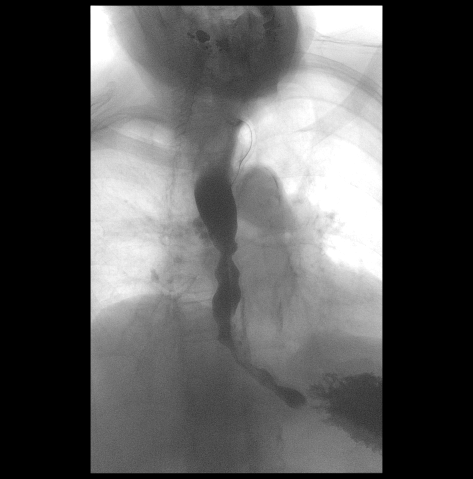
[frame 89/114]
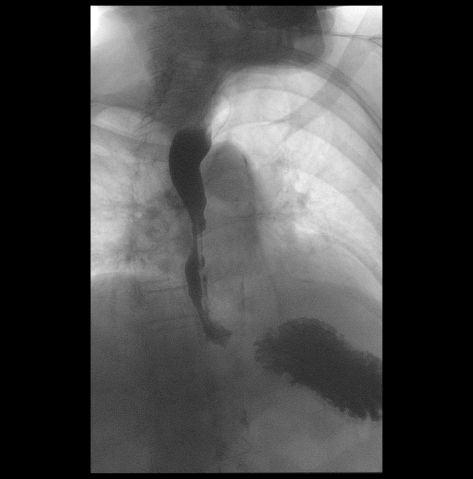
[frame 97/114]
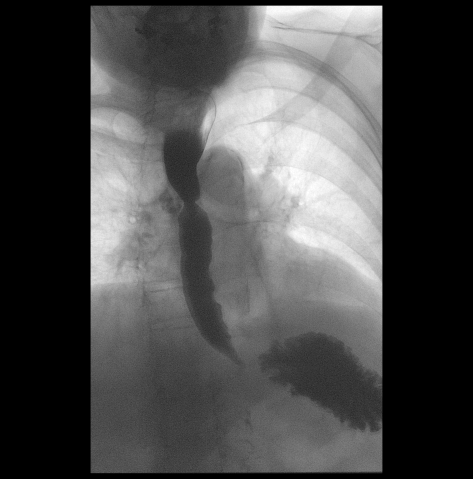

[Series 2: fluoro_barium 2fps_bw · 0.18mm/px · 1 of 1 slices shown (1 of 2)]
[im 1/1]
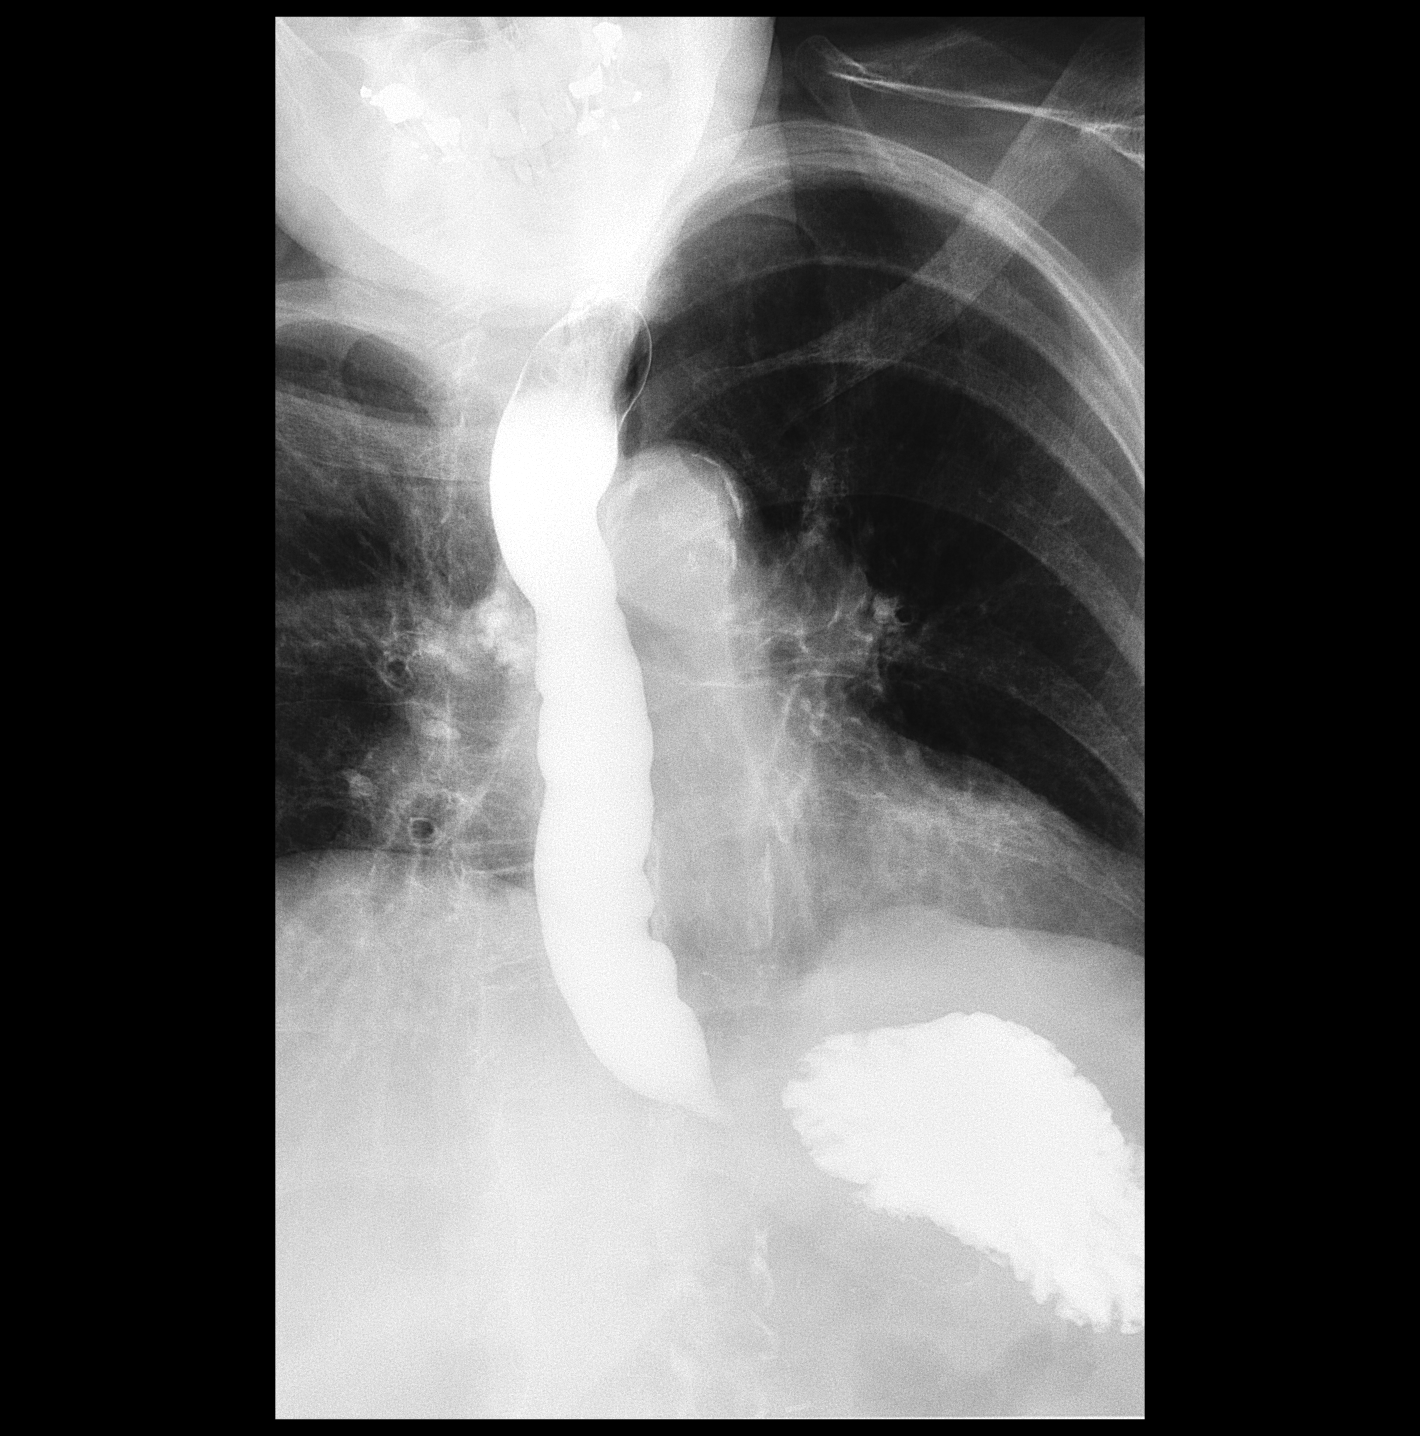

[Series 3: fluoro_barium 2fps_bw · 0.18mm/px · 1 of 1 slices shown (2 of 2)]
[im 1/1]
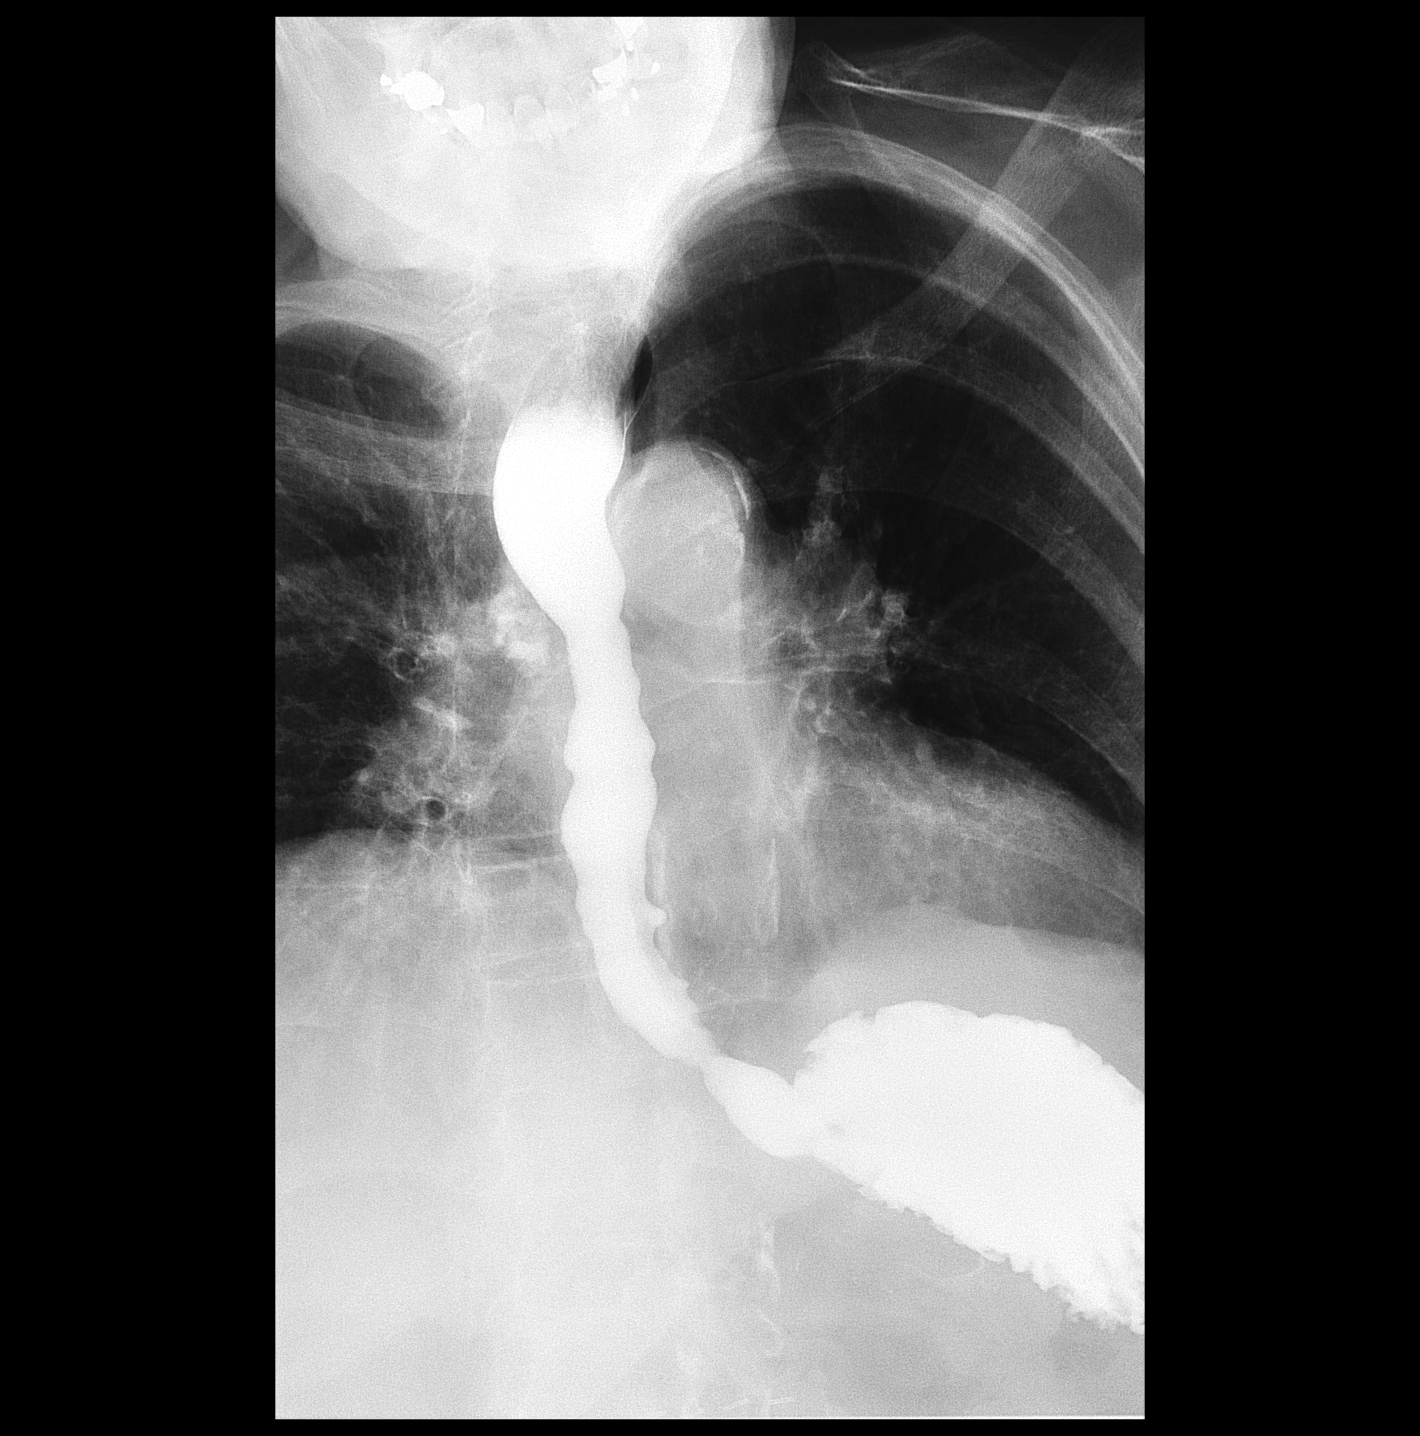

[6 of 6 positions shown; findings below may reference images not displayed]

FINDINGS: Single contrast barium esophagram was performed due to lack of
patient mobility and difficult to with patient following verbal
instructions. This demonstrated intermittent failure to properly
propagate the primary peristaltic wave, with frequent tertiary
contractions. No esophageal ring identified on this single contrast
examination. Small hiatal hernia.
IMPRESSION: 1. Nonspecific esophageal motility disorder with extensive tertiary
contractions.
2. Small hiatal hernia.

## 2017-01-02 IMAGING — CR DG CHEST 1V PORT
1 series · 1 of 1 positions shown · non-contrast
Comparison: 01/27/2016

CLINICAL DATA: Low blood pressure and tachycardia.

EXAM:
PORTABLE CHEST 1 VIEW

[ap portable]
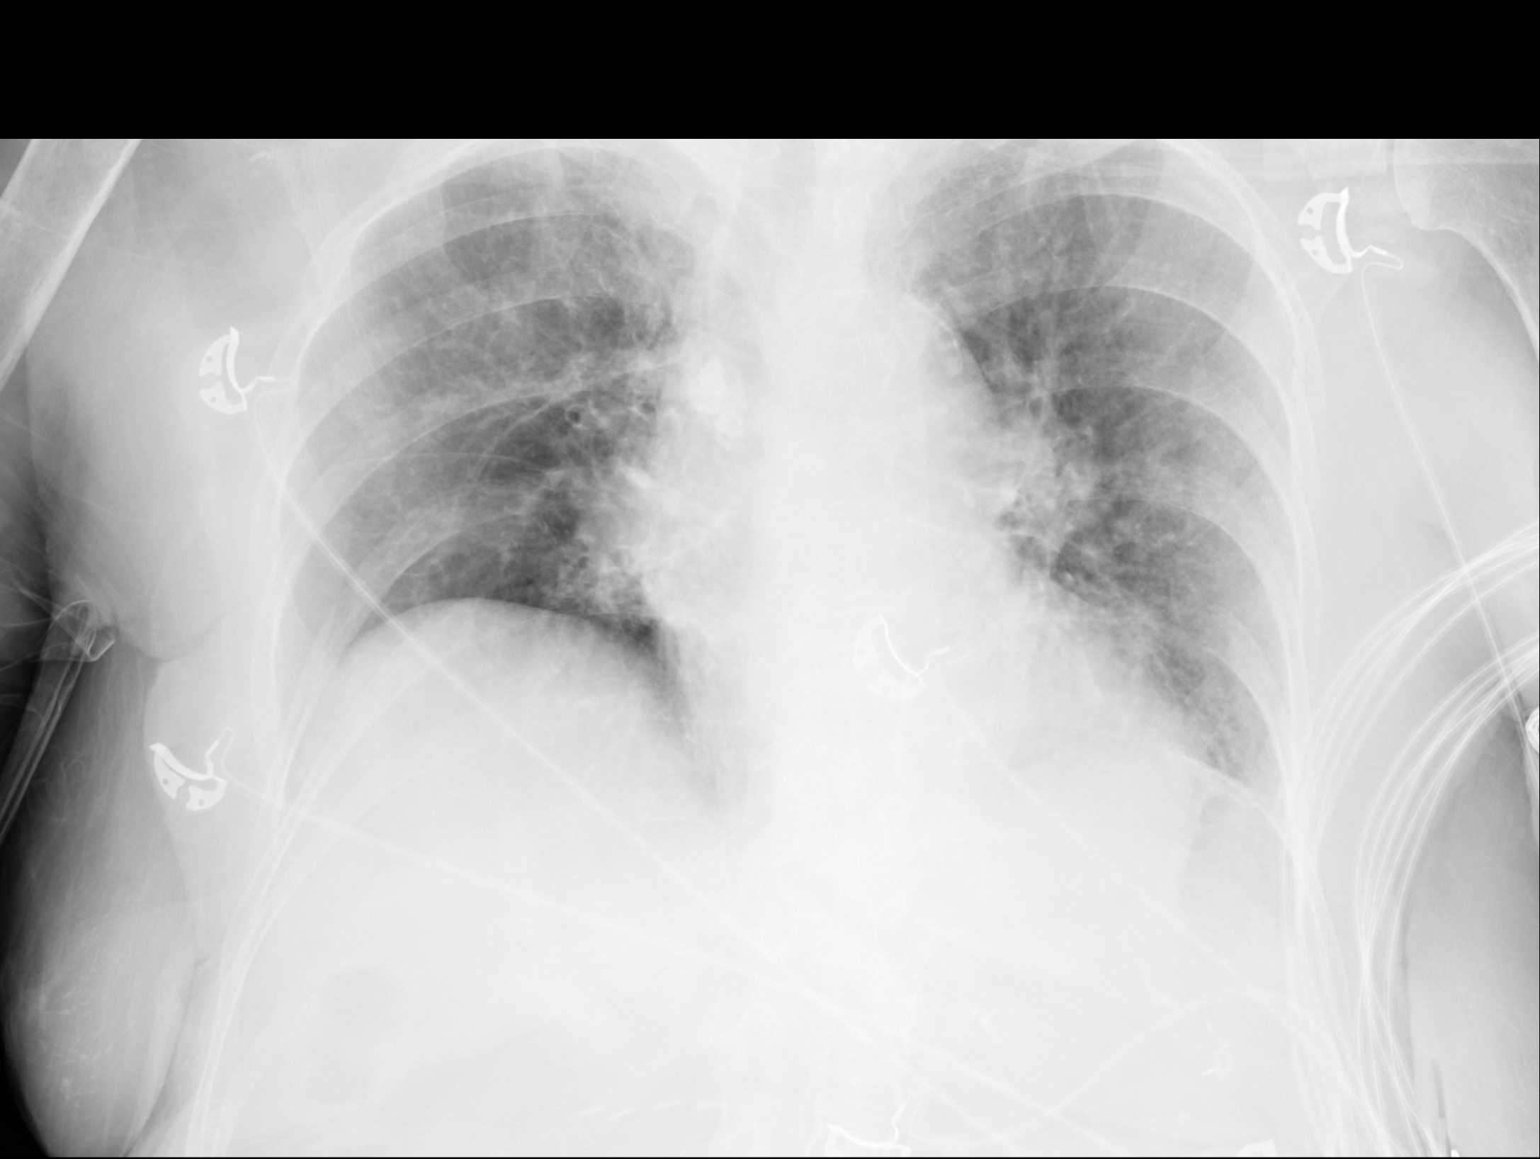

[1 of 1 positions shown; findings below may reference images not displayed]

FINDINGS: 7797 hours. Low volume film. Interstitial markings are diffusely
coarsened with chronic features. No overt airspace pulmonary edema
or focal lung consolidation. No pleural effusion. Telemetry leads
overlie the chest.
IMPRESSION: Stable exam.  Low volumes with chronic interstitial disease.
